# Patient Record
Sex: Male | Born: 1980 | Race: White | Hispanic: No | Marital: Single | Smoking: Current every day smoker
Health system: Southern US, Community
[De-identification: ages and names within clinical notes are randomized; demographics above are authoritative.]

## PROBLEM LIST (undated history)

## (undated) DIAGNOSIS — M542 Cervicalgia: Secondary | ICD-10-CM

## (undated) DIAGNOSIS — B1920 Unspecified viral hepatitis C without hepatic coma: Secondary | ICD-10-CM

## (undated) DIAGNOSIS — N200 Calculus of kidney: Secondary | ICD-10-CM

## (undated) DIAGNOSIS — R51 Headache: Secondary | ICD-10-CM

## (undated) DIAGNOSIS — F419 Anxiety disorder, unspecified: Secondary | ICD-10-CM

## (undated) DIAGNOSIS — N390 Urinary tract infection, site not specified: Secondary | ICD-10-CM

## (undated) DIAGNOSIS — F329 Major depressive disorder, single episode, unspecified: Secondary | ICD-10-CM

## (undated) DIAGNOSIS — F32A Depression, unspecified: Secondary | ICD-10-CM

## (undated) DIAGNOSIS — G8929 Other chronic pain: Secondary | ICD-10-CM

## (undated) DIAGNOSIS — J45909 Unspecified asthma, uncomplicated: Secondary | ICD-10-CM

## (undated) HISTORY — PX: NO PAST SURGERIES: SHX2092

## (undated) HISTORY — DX: Unspecified viral hepatitis C without hepatic coma: B19.20

---

## 2002-08-02 ENCOUNTER — Emergency Department (HOSPITAL_COMMUNITY): Admission: EM | Admit: 2002-08-02 | Discharge: 2002-08-02 | Payer: Self-pay | Admitting: Emergency Medicine

## 2002-08-02 ENCOUNTER — Encounter: Payer: Self-pay | Admitting: Emergency Medicine

## 2009-04-05 ENCOUNTER — Emergency Department (HOSPITAL_COMMUNITY): Admission: EM | Admit: 2009-04-05 | Discharge: 2009-04-05 | Payer: Self-pay | Admitting: Emergency Medicine

## 2009-04-08 ENCOUNTER — Emergency Department (HOSPITAL_COMMUNITY): Admission: EM | Admit: 2009-04-08 | Discharge: 2009-04-08 | Payer: Self-pay | Admitting: Emergency Medicine

## 2009-04-12 ENCOUNTER — Emergency Department (HOSPITAL_COMMUNITY): Admission: EM | Admit: 2009-04-12 | Discharge: 2009-04-12 | Payer: Self-pay | Admitting: Emergency Medicine

## 2009-04-12 ENCOUNTER — Emergency Department (HOSPITAL_COMMUNITY): Admission: EM | Admit: 2009-04-12 | Discharge: 2009-04-12 | Payer: Self-pay | Admitting: Family Medicine

## 2010-04-17 ENCOUNTER — Emergency Department (HOSPITAL_COMMUNITY): Admission: EM | Admit: 2010-04-17 | Discharge: 2010-04-17 | Payer: Self-pay | Admitting: Emergency Medicine

## 2010-09-04 ENCOUNTER — Emergency Department (HOSPITAL_COMMUNITY)
Admission: EM | Admit: 2010-09-04 | Discharge: 2010-09-05 | Disposition: A | Discharge status: Rehabilitation Facility | Payer: Self-pay | Attending: Emergency Medicine | Admitting: Emergency Medicine

## 2010-09-04 DIAGNOSIS — F1910 Other psychoactive substance abuse, uncomplicated: Secondary | ICD-10-CM | POA: Insufficient documentation

## 2010-09-04 DIAGNOSIS — R000 Tachycardia, unspecified: Secondary | ICD-10-CM | POA: Insufficient documentation

## 2010-09-04 LAB — DIFFERENTIAL
Basophils Absolute: 0 10*3/uL (ref 0.0–0.1)
Basophils Relative: 0 % (ref 0–1)
Eosinophils Absolute: 0.1 10*3/uL (ref 0.0–0.7)
Eosinophils Relative: 2 % (ref 0–5)
Lymphocytes Relative: 37 % (ref 12–46)
Lymphs Abs: 2.8 10*3/uL (ref 0.7–4.0)
Monocytes Absolute: 0.6 10*3/uL (ref 0.1–1.0)
Monocytes Relative: 7 % (ref 3–12)
Neutro Abs: 4 10*3/uL (ref 1.7–7.7)
Neutrophils Relative %: 54 % (ref 43–77)

## 2010-09-04 LAB — COMPREHENSIVE METABOLIC PANEL
ALT: 15 U/L (ref 0–53)
AST: 16 U/L (ref 0–37)
Albumin: 4.5 g/dL (ref 3.5–5.2)
Alkaline Phosphatase: 78 U/L (ref 39–117)
CO2: 23 mEq/L (ref 19–32)
Calcium: 9.6 mg/dL (ref 8.4–10.5)
Chloride: 101 mEq/L (ref 96–112)
Creatinine, Ser: 1.02 mg/dL (ref 0.4–1.5)
GFR calc Af Amer: >60 mL/min (ref >60)
GFR calc non Af Amer: >60 mL/min (ref >60)
Glucose, Bld: 157 mg/dL — ABNORMAL HIGH (ref 70–99)
Potassium: 3.6 mEq/L (ref 3.5–5.1)
Total Bilirubin: 0.6 mg/dL (ref 0.3–1.2)
Total Protein: 8.1 g/dL (ref 6.0–8.3)

## 2010-09-04 LAB — CBC
HCT: 51.2 % (ref 39.0–52.0)
Hemoglobin: 17.9 g/dL — ABNORMAL HIGH (ref 13.0–17.0)
MCH: 31.2 pg (ref 26.0–34.0)
MCHC: 35 g/dL (ref 30.0–36.0)
MCV: 89.2 fL (ref 78.0–100.0)
Platelets: 215 10*3/uL (ref 150–400)
RBC: 5.74 MIL/uL (ref 4.22–5.81)
WBC: 7.5 10*3/uL (ref 4.0–10.5)

## 2010-09-04 LAB — ETHANOL: Alcohol, Ethyl (B): 10 mg/dL (ref 0–10)

## 2010-09-04 LAB — RAPID URINE DRUG SCREEN, HOSP PERFORMED: Barbiturates: NOT DETECTED

## 2012-06-13 ENCOUNTER — Emergency Department (HOSPITAL_COMMUNITY)
Admission: EM | Admit: 2012-06-13 | Discharge: 2012-06-13 | Disposition: A | Discharge status: Home / Self Care | Payer: No Typology Code available for payment source | Attending: Emergency Medicine | Admitting: Emergency Medicine

## 2012-06-13 ENCOUNTER — Emergency Department (HOSPITAL_COMMUNITY): Payer: No Typology Code available for payment source

## 2012-06-13 ENCOUNTER — Encounter (HOSPITAL_COMMUNITY): Payer: Self-pay | Admitting: Nurse Practitioner

## 2012-06-13 DIAGNOSIS — F17200 Nicotine dependence, unspecified, uncomplicated: Secondary | ICD-10-CM | POA: Insufficient documentation

## 2012-06-13 DIAGNOSIS — Z79899 Other long term (current) drug therapy: Secondary | ICD-10-CM | POA: Insufficient documentation

## 2012-06-13 DIAGNOSIS — Z87828 Personal history of other (healed) physical injury and trauma: Secondary | ICD-10-CM | POA: Insufficient documentation

## 2012-06-13 DIAGNOSIS — M542 Cervicalgia: Secondary | ICD-10-CM | POA: Insufficient documentation

## 2012-06-13 DIAGNOSIS — G8929 Other chronic pain: Secondary | ICD-10-CM | POA: Insufficient documentation

## 2012-06-13 MED ORDER — OXYCODONE-ACETAMINOPHEN 5-325 MG PO TABS
2.0000 | ORAL_TABLET | Freq: Once | ORAL | Status: AC
  Administered 2012-06-13: 2 via ORAL
  Filled 2012-06-13: qty 2

## 2012-06-13 MED ORDER — HYDROCODONE-ACETAMINOPHEN 5-325 MG PO TABS: 2.0000 | ORAL_TABLET | ORAL | Status: DC | PRN

## 2012-06-13 MED ORDER — CYCLOBENZAPRINE HCL 10 MG PO TABS
10.0000 mg | ORAL_TABLET | Freq: Once | ORAL | Status: AC
  Administered 2012-06-13: 10 mg via ORAL
  Filled 2012-06-13: qty 1

## 2012-06-13 MED ORDER — IBUPROFEN 400 MG PO TABS
800.0000 mg | ORAL_TABLET | Freq: Once | ORAL | Status: AC
  Administered 2012-06-13: 800 mg via ORAL
  Filled 2012-06-13: qty 2

## 2012-06-13 MED ORDER — IBUPROFEN 600 MG PO TABS: 600.0000 mg | ORAL_TABLET | Freq: Four times a day (QID) | ORAL | Status: DC | PRN

## 2012-06-13 MED ORDER — CYCLOBENZAPRINE HCL 10 MG PO TABS: 10.0000 mg | ORAL_TABLET | Freq: Two times a day (BID) | ORAL | Status: DC | PRN

## 2012-06-13 NOTE — ED Notes (Signed)
Patient is alert and orientedx4.  Patient was explained discharge instructions and he understood them.  Patient's step-mom is coming to get the patient to transport him home.

## 2012-06-13 NOTE — ED Notes (Signed)
Patient transported to CT 
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
## 2012-06-13 NOTE — ED Notes (Signed)
Patient has called out several times and asked for pain medication.  I advised him that we only had one provider seeing all the patients in [place] and I would let her know about his pain.  A CT has been ordered for him.
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
## 2012-06-17 ENCOUNTER — Other Ambulatory Visit: Payer: Self-pay | Admitting: Specialist

## 2012-06-17 DIAGNOSIS — R29898 Other symptoms and signs involving the musculoskeletal system: Secondary | ICD-10-CM

## 2012-06-19 NOTE — ED Provider Notes (Signed)
Medical screening examination/treatment/procedure(s) were performed by non-physician practitioner and as supervising physician I was immediately available for consultation/collaboration.   Hurman Horn, MD 06/19/12 2200

## 2012-06-22 ENCOUNTER — Ambulatory Visit
Admission: RE | Admit: 2012-06-22 | Discharge: 2012-06-22 | Disposition: A | Discharge status: Home / Self Care | Payer: PRIVATE HEALTH INSURANCE | Source: Ambulatory Visit | Attending: Specialist | Admitting: Specialist

## 2012-06-22 DIAGNOSIS — R29898 Other symptoms and signs involving the musculoskeletal system: Secondary | ICD-10-CM

## 2012-06-23 ENCOUNTER — Encounter (HOSPITAL_COMMUNITY): Payer: Self-pay

## 2012-06-23 ENCOUNTER — Inpatient Hospital Stay (HOSPITAL_COMMUNITY)
Admission: EM | Admit: 2012-06-23 | Discharge: 2012-07-08 | DRG: 096 | Disposition: A | Discharge status: Skilled Nursing Facility | Payer: PRIVATE HEALTH INSURANCE | Attending: Internal Medicine | Admitting: Internal Medicine

## 2012-06-23 DIAGNOSIS — M4640 Discitis, unspecified, site unspecified: Secondary | ICD-10-CM

## 2012-06-23 DIAGNOSIS — E876 Hypokalemia: Secondary | ICD-10-CM | POA: Diagnosis present

## 2012-06-23 DIAGNOSIS — B1920 Unspecified viral hepatitis C without hepatic coma: Secondary | ICD-10-CM | POA: Diagnosis present

## 2012-06-23 DIAGNOSIS — R52 Pain, unspecified: Secondary | ICD-10-CM | POA: Diagnosis present

## 2012-06-23 DIAGNOSIS — G061 Intraspinal abscess and granuloma: Principal | ICD-10-CM | POA: Diagnosis present

## 2012-06-23 DIAGNOSIS — Z720 Tobacco use: Secondary | ICD-10-CM | POA: Diagnosis present

## 2012-06-23 DIAGNOSIS — G8929 Other chronic pain: Secondary | ICD-10-CM

## 2012-06-23 DIAGNOSIS — F17200 Nicotine dependence, unspecified, uncomplicated: Secondary | ICD-10-CM | POA: Diagnosis present

## 2012-06-23 DIAGNOSIS — F1910 Other psychoactive substance abuse, uncomplicated: Secondary | ICD-10-CM | POA: Diagnosis present

## 2012-06-23 DIAGNOSIS — M519 Unspecified thoracic, thoracolumbar and lumbosacral intervertebral disc disorder: Secondary | ICD-10-CM

## 2012-06-23 DIAGNOSIS — G062 Extradural and subdural abscess, unspecified: Secondary | ICD-10-CM

## 2012-06-23 DIAGNOSIS — M4642 Discitis, unspecified, cervical region: Secondary | ICD-10-CM | POA: Diagnosis present

## 2012-06-23 DIAGNOSIS — M5090 Cervical disc disorder, unspecified, unspecified cervical region: Secondary | ICD-10-CM | POA: Diagnosis present

## 2012-06-23 LAB — CBC WITH DIFFERENTIAL/PLATELET
Eosinophils Absolute: 0 10*3/uL (ref 0.0–0.7)
Eosinophils Relative: 0 % (ref 0–5)
Hemoglobin: 16.7 g/dL (ref 13.0–17.0)
Lymphs Abs: 2.1 10*3/uL (ref 0.7–4.0)
MCH: 31.5 pg (ref 26.0–34.0)
MCV: 87 fL (ref 78.0–100.0)
Monocytes Relative: 7 % (ref 3–12)
RBC: 5.3 MIL/uL (ref 4.22–5.81)

## 2012-06-23 LAB — URINALYSIS, ROUTINE W REFLEX MICROSCOPIC
Bilirubin Urine: NEGATIVE
Glucose, UA: NEGATIVE mg/dL
Hgb urine dipstick: NEGATIVE
Specific Gravity, Urine: 1.025 (ref 1.005–1.030)
Urobilinogen, UA: 1 mg/dL (ref 0.0–1.0)

## 2012-06-23 LAB — RAPID URINE DRUG SCREEN, HOSP PERFORMED
Amphetamines: NOT DETECTED
Barbiturates: NOT DETECTED
Benzodiazepines: NOT DETECTED
Cocaine: NOT DETECTED
Tetrahydrocannabinol: POSITIVE — AB

## 2012-06-23 LAB — BASIC METABOLIC PANEL
BUN: 13 mg/dL (ref 6–23)
CO2: 26 mEq/L (ref 19–32)
Calcium: 9.5 mg/dL (ref 8.4–10.5)
GFR calc non Af Amer: >90 mL/min (ref >90)
Glucose, Bld: 98 mg/dL (ref 70–99)
Potassium: 4 mEq/L (ref 3.5–5.1)

## 2012-06-23 LAB — URINE MICROSCOPIC-ADD ON

## 2012-06-23 MED ORDER — ACETAMINOPHEN 650 MG RE SUPP: 650.0000 mg | Freq: Four times a day (QID) | RECTAL | Status: DC | PRN

## 2012-06-23 MED ORDER — VANCOMYCIN HCL IN DEXTROSE 1-5 GM/200ML-% IV SOLN
1000.0000 mg | Freq: Once | INTRAVENOUS | Status: AC
  Administered 2012-06-23: 1000 mg via INTRAVENOUS
  Filled 2012-06-23: qty 200

## 2012-06-23 MED ORDER — ACETAMINOPHEN 325 MG PO TABS
650.0000 mg | ORAL_TABLET | Freq: Four times a day (QID) | ORAL | Status: DC | PRN
  Administered 2012-07-02: 650 mg via ORAL
  Filled 2012-06-23: qty 2

## 2012-06-23 MED ORDER — SODIUM CHLORIDE 0.9 % IV BOLUS (SEPSIS)
1000.0000 mL | Freq: Once | INTRAVENOUS | Status: AC
  Administered 2012-06-23: 1000 mL via INTRAVENOUS

## 2012-06-23 MED ORDER — MORPHINE SULFATE 4 MG/ML IJ SOLN
4.0000 mg | Freq: Once | INTRAMUSCULAR | Status: AC
  Administered 2012-06-23: 4 mg via INTRAVENOUS
  Filled 2012-06-23: qty 1

## 2012-06-23 MED ORDER — HYDROMORPHONE HCL PF 1 MG/ML IJ SOLN
1.0000 mg | Freq: Once | INTRAMUSCULAR | Status: AC
  Administered 2012-06-23: 1 mg via INTRAVENOUS
  Filled 2012-06-23: qty 1

## 2012-06-23 MED ORDER — ONDANSETRON HCL 4 MG PO TABS: 4.0000 mg | ORAL_TABLET | Freq: Four times a day (QID) | ORAL | Status: DC | PRN

## 2012-06-23 MED ORDER — MORPHINE SULFATE 2 MG/ML IJ SOLN
2.0000 mg | INTRAMUSCULAR | Status: DC | PRN
  Administered 2012-06-23 – 2012-06-29 (×33): 4 mg via INTRAVENOUS
  Administered 2012-06-29: 2 mg via INTRAVENOUS
  Administered 2012-06-29 – 2012-06-30 (×4): 4 mg via INTRAVENOUS
  Filled 2012-06-23 (×13): qty 2
  Filled 2012-06-23: qty 1
  Filled 2012-06-23 (×24): qty 2

## 2012-06-23 MED ORDER — METRONIDAZOLE IN NACL 5-0.79 MG/ML-% IV SOLN
500.0000 mg | Freq: Once | INTRAVENOUS | Status: AC
  Administered 2012-06-23: 500 mg via INTRAVENOUS
  Filled 2012-06-23: qty 100

## 2012-06-23 MED ORDER — SODIUM CHLORIDE 0.9 % IJ SOLN
3.0000 mL | Freq: Two times a day (BID) | INTRAMUSCULAR | Status: DC
Start: 2012-06-23 — End: 2012-07-08
  Administered 2012-06-23 – 2012-07-07 (×9): 3 mL via INTRAVENOUS

## 2012-06-23 MED ORDER — VANCOMYCIN HCL IN DEXTROSE 1-5 GM/200ML-% IV SOLN
1000.0000 mg | Freq: Three times a day (TID) | INTRAVENOUS | Status: DC
  Administered 2012-06-23 – 2012-06-24 (×3): 1000 mg via INTRAVENOUS
  Filled 2012-06-23 (×6): qty 200

## 2012-06-23 MED ORDER — HYDROCODONE-ACETAMINOPHEN 5-325 MG PO TABS
2.0000 | ORAL_TABLET | ORAL | Status: DC | PRN
  Administered 2012-06-24 – 2012-06-30 (×26): 2 via ORAL
  Filled 2012-06-23 (×26): qty 2

## 2012-06-23 MED ORDER — ONDANSETRON HCL 4 MG/2ML IJ SOLN: 4.0000 mg | Freq: Four times a day (QID) | INTRAMUSCULAR | Status: DC | PRN

## 2012-06-23 MED ORDER — SODIUM CHLORIDE 0.9 % IV SOLN
INTRAVENOUS | Status: DC
  Administered 2012-06-23: 23:00:00 via INTRAVENOUS
  Administered 2012-06-30: 250 mL via INTRAVENOUS
  Administered 2012-07-05: 20 mL/h via INTRAVENOUS

## 2012-06-23 MED ORDER — HEPARIN SODIUM (PORCINE) 5000 UNIT/ML IJ SOLN
5000.0000 [IU] | Freq: Three times a day (TID) | INTRAMUSCULAR | Status: DC
  Administered 2012-06-23 – 2012-07-08 (×43): 5000 [IU] via SUBCUTANEOUS
  Filled 2012-06-23 (×52): qty 1

## 2012-06-23 MED ORDER — METHOCARBAMOL 100 MG/ML IJ SOLN
500.0000 mg | Freq: Four times a day (QID) | INTRAVENOUS | Status: DC
  Administered 2012-06-24 – 2012-07-03 (×38): 500 mg via INTRAVENOUS
  Filled 2012-06-23 (×50): qty 5

## 2012-06-23 MED ORDER — ALUM & MAG HYDROXIDE-SIMETH 200-200-20 MG/5ML PO SUSP: 30.0000 mL | Freq: Four times a day (QID) | ORAL | Status: DC | PRN

## 2012-06-23 MED ORDER — DEXTROSE 5 % IV SOLN
1.0000 g | Freq: Two times a day (BID) | INTRAVENOUS | Status: DC
  Administered 2012-06-23 – 2012-06-24 (×3): 1 g via INTRAVENOUS
  Filled 2012-06-23 (×4): qty 1

## 2012-06-23 MED ORDER — IBUPROFEN 600 MG PO TABS
600.0000 mg | ORAL_TABLET | Freq: Four times a day (QID) | ORAL | Status: DC | PRN
  Filled 2012-06-23: qty 1

## 2012-06-23 NOTE — Consult Note (Signed)
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   HQI:Christopher Berg is an 32 y.o. male. Right hand dominant with a two week history of neck pain. 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 History of polysubstance use documented by Serum toxicology screen 09/2010 (see EMR record for the visit). Was seen at [facility] nearly 2 weeks ago then at [facility] on 06/13/2012 complaining of severe neck and shoulder pain.  CT Scan 1/11 with degenerative changes C5-6 and C6-7 no epidural or paraspinal hematoma. Referred to my partner Dr. Lajoyce Corners on call for Orthopaedics for follow up. Patient seen in my office 06/15/2012 with neck pain wearing a soft C-collar With diffuse left upper extremity weakness, non focal. MRI was ordered. The results of the MRI done 06/22/2012 were called to me today showing discitis at C5-6 with epidural  abscess. Patient was told to go to the emergency room to Be admitted for treatment. Low grade fever. Night sweats.   History reviewed. No pertinent past medical history.  History reviewed. No pertinent past surgical history.  No family history on file.  Social History:  reports that he has been smoking.  He does not have any smokeless tobacco history on file. He reports that he drinks alcohol. He reports that he does not use illicit drugs.  Allergies: No Known Allergies  Medications: Prior to Admission:  Percocet 5/325 1-2 every 4-6 hours prn pain.  Results for orders placed during the hospital encounter of 06/23/12 (from the past 48 hour(s))  CBC WITH DIFFERENTIAL     Status: Abnormal   Collection Time   06/23/12  1:11 PM      Component Value Range Comment   WBC 10.1  4.0 - 10.5 K/uL    RBC 5.30  4.22 - 5.81 MIL/uL    Hemoglobin 16.7  13.0 - 17.0 g/dL    HCT 52.8  41.3 - 24.4 %    MCV 87.0   78.0 - 100.0 fL    MCH 31.5  26.0 - 34.0 pg    MCHC 36.2 (*) 30.0 - 36.0 g/dL    RDW 01.0  27.2 - 53.6 %    Platelets 253  150 - 400 K/uL    Neutrophils Relative 72  43 - 77 %    Neutro Abs 7.2  1.7 - 7.7 K/uL    Lymphocytes Relative 20  12 - 46 %    Lymphs Abs 2.1  0.7 - 4.0 K/uL    Monocytes Relative 7  3 - 12 %    Monocytes Absolute 0.8  0.1 - 1.0 K/uL    Eosinophils Relative 0  0 - 5 %    Eosinophils Absolute 0.0  0.0 - 0.7 K/uL    Basophils Relative 0  0 - 1 %    Basophils Absolute 0.0  0.0 - 0.1 K/uL   BASIC METABOLIC PANEL     Status: Normal   Collection Time   06/23/12  1:11 PM      Component Value Range Comment   Sodium 138  135 - 145 mEq/L    Potassium 4.0  3.5 - 5.1 mEq/L    Chloride 100  96 - 112 mEq/L    CO2 26  19 - 32 mEq/L    Glucose, Bld 98  70 - 99 mg/dL    BUN 13  6 - 23 mg/dL  Creatinine, Ser 0.75  0.50 - 1.35 mg/dL    Calcium 9.5  8.4 - 16.1 mg/dL    GFR calc non Af Amer >90  >90 mL/min    GFR calc Af Amer >90  >90 mL/min   URINALYSIS, ROUTINE W REFLEX MICROSCOPIC     Status: Abnormal   Collection Time   06/23/12  1:46 PM      Component Value Range Comment   Color, Urine YELLOW  YELLOW    APPearance TURBID (*) CLEAR    Specific Gravity, Urine 1.025  1.005 - 1.030    pH 8.0  5.0 - 8.0    Glucose, UA NEGATIVE  NEGATIVE mg/dL    Hgb urine dipstick NEGATIVE  NEGATIVE    Bilirubin Urine NEGATIVE  NEGATIVE    Ketones, ur NEGATIVE  NEGATIVE mg/dL    Protein, ur NEGATIVE  NEGATIVE mg/dL    Urobilinogen, UA 1.0  0.0 - 1.0 mg/dL    Nitrite NEGATIVE  NEGATIVE    Leukocytes, UA TRACE (*) NEGATIVE   URINE RAPID DRUG SCREEN (HOSP PERFORMED)     Status: Abnormal   Collection Time   06/23/12  1:46 PM      Component Value Range Comment   Opiates NONE DETECTED  NONE DETECTED    Cocaine NONE DETECTED  NONE DETECTED    Benzodiazepines NONE DETECTED  NONE DETECTED    Amphetamines NONE DETECTED  NONE DETECTED    Tetrahydrocannabinol POSITIVE (*) NONE DETECTED     Barbiturates NONE DETECTED  NONE DETECTED   URINE MICROSCOPIC-ADD ON     Status: Abnormal   Collection Time   06/23/12  1:46 PM      Component Value Range Comment   Squamous Epithelial / LPF RARE  RARE    WBC, UA 0-2  <3 WBC/hpf    Bacteria, UA FEW (*) RARE    Urine-Other AMORPHOUS URATES/PHOSPHATES       Mr Cervical Spine Wo Contrast  06/23/2012  *RADIOLOGY REPORT*  Clinical Data: Severe neck pain and stiffness.  Bilateral shoulder pain and upper extremity weakness since a lifting injury 2 weeks ago.  MRI CERVICAL SPINE WITHOUT CONTRAST  Technique:  Multiplanar and multiecho pulse sequences of the cervical spine, to include the craniocervical junction and cervicothoracic junction, were obtained according to standard protocol without intravenous contrast.  Comparison: CT scan dated 06/13/2012 and cervical MRI dated 04/12/2009  Findings: The patient has diskitis at C5-6 with an anterior epidural abscess and a prevertebral soft tissue abscess.  The posterior longitudinal ligament is elevated from the C4-5 level to the C6-7 level.  Prevertebral edema extends from C2-3 to C6-7.  There is narrowing of the C5-6 disc space with edema in the C5 and C6 vertebra.  There is a small erosion of the posterior-superior aspect of C6 and there is a 2.5 mm retrolisthesis of C5 on C6. The thecal sac is compressed by the abscess and there is slight compression of the spinal cord without myelopathy.  C1-2 through C4-5:  The discs are normal.  C6-7:  Small disc protrusion with accompanying osteophytes centrally and asymmetric into the left lateral recess, decreased in size since the prior MRI. 2 mm retrolisthesis of C6 on C7.  C7-T1 through T2-3:  Normal.  IMPRESSION: 1.  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 2.  Thecal sac and spinal cord are slightly compressed at C5-6 without myelopathy. 3.  2.5 mm retrolisthesis of C5 on C6. 3.  Edema in the C5  and C6 vertebra.  This could  represent reactive edema or osteomyelitis.  There is a small focal area of erosion of the posterior-superior aspect  of C6 in the midline but the margin is sclerotic suggesting this is chronic.           Emergent results were called by phone at the time of interpretation on 06/23/2012 at 9:25 a.m. to Dr. Otelia Sergeant,, who verbally acknowledged the results.   Original Report Authenticated By: Francene Boyers, M.D.     Blood pressure 135/93, pulse 95, temperature 98.4 F (36.9 C), temperature source Oral, resp. rate 16, SpO2 100.00%.  Orthopaedic Exam:Awake, alert, and Oriented x 4 pleasant thin male. Cervical collar is in place.  Guarded ROM of the cervical spine all directions. Weakness left arm shoulder abduction 5-/5, left biceps 4+/5, left triceps 4+/5. Left wrist extension is 5-/5, left wrist volar flexion is 5-/5 Finger flexion and extension is intact and normal. Hoffman negative bilateral.  Right arm strength is normal. Lower extremity motor and sensation is normal. No clonus, no spasticity.  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 History of polysubstance abuse, toxicology positive for tetrahydrocannabinol today.   Plan: Recommend admit for IV antibiotics. Soft cervical collar for  Immobilization of the C-spine. Infectious disease consult. Discussed with ER physician my request for this patient to be admitted to medical service with with ID consultation and request for Neurosurgery consultation. I spoke with Dr. Jeral Fruit by phone and he has indicated that I should handle this case as I am a spine surgeon and hung up the phone. Dr. Jeral Fruit is the Neurosurgeon on call today for unassigned patients. I have seen this patient today and advised for admission for antibiotic treatment and close neurologic monitoring. He has had this infection likely for 2 weeks and hopefully it will be sensitive to antibiotics. If there is  worsening neurologic Status then he will need decompression.  I discussed the patient's case with Dr. Annell Greening. Will follow while in the Hospital.  Christopher Berg 06/23/2012, 2:47 PM

## 2012-06-23 NOTE — ED Notes (Addendum)
Pt. Has had posterior neck pain for 2 weeks.  Dr. Otelia Sergeant from [facility] did an MRI last night and pt. Was called by their office this am and was instructed to come to the ED to be seen by a Neuro Surgeon, ASAP>   Pt. Is out of his pain medication and is having pain, pt. Having coldness and numbness to hands.  Christopher Berg

## 2012-06-23 NOTE — H&P (Signed)
History and Physical       Hospital Admission Note Date: 06/23/2012  Patient name: Christopher Berg Medical record number: [id number] Date of birth: 1981-01-27 Age: 32 y.o. Gender: male PCP: Patient has no PCP   Chief Complaint:  Intractable neck pain with left upper extremity weakness  HPI: Patient is a 32 year old male with IV drug abuse history, polysubstance abuse has been complaining of intractable back pain for the last 2 weeks. History was obtained from the patient who stated that he was only is seen at the [facility] 2 weeks ago, then again [facility] ED on 06/13/2012 and he was referred to orthopedics for neck pain. CT scan on 06/13/2012 had just shown degenerative changes of the C5-C6 and C6-C7. Patient was subsequently seen by Dr. Otelia Sergeant in his office on 06/15/2012 and outpatient MRI was ordered. MRI done on 06/22/2012 showed discitis in C5-C6 with epidural abscess and prevertebral abscess with the edema in the C5-C6 vertebrae. The results were called to Dr. Otelia Sergeant, and patient was recommended to come to ER.  Patient is currently complaining of 10/10 pain in the neck and shoulders with weakness and numbness and tingling in the left arm.  Review of Systems:  Constitutional: Denies fever, chills, diaphoresis, appetite change and fatigue.  HEENT: Denies photophobia, eye pain, redness, hearing loss, ear pain, congestion, sore throat, rhinorrhea, sneezing, mouth sores, trouble swallowing,  and tinnitus.  + neck pain with stiffness Respiratory: Denies SOB, DOE, cough, chest tightness,  and wheezing.   Cardiovascular: Denies chest pain, palpitations and leg swelling.  Gastrointestinal: Denies nausea, vomiting, abdominal pain, diarrhea, constipation, blood in stool and abdominal distention.  Genitourinary: Denies dysuria, urgency, frequency, hematuria, flank pain and difficulty urinating.  Musculoskeletal: Denies myalgias, joint swelling,  arthralgias and gait problem. + he states that the neck pain is being referred to the back  Skin: Denies pallor, rash and wound.  Neurological: Denies dizziness, seizures, syncope, light-headedness, and headaches.  Hematological: Denies adenopathy. Easy bruising, personal or family bleeding history  Psychiatric/Behavioral: Denies suicidal ideation, mood changes, confusion, nervousness, sleep disturbance and agitation  Past Medical History: Nicotine abuse Polysubstance abuse  History reviewed. No pertinent past surgical history.  Medications: Prior to Admission medications   Medication Sig Start Date End Date Taking? Authorizing Provider  cyclobenzaprine (FLEXERIL) 10 MG tablet Take 10 mg by mouth every 8 (eight) hours as needed. For muscle spasms   Yes Historical Provider, MD  HYDROcodone-acetaminophen (NORCO) 10-325 MG per tablet Take 1 tablet by mouth every 2 (two) hours as needed. For pain   Yes Historical Provider, MD  ibuprofen (ADVIL,MOTRIN) 600 MG tablet Take 600 mg by mouth every 6 (six) hours as needed. For pain. 06/13/12  Yes Remi Haggard, NP    Allergies:  No Known Allergies  Social History:  reports that he has been smoking.  He does not have any smokeless tobacco history on file. He reports that he drinks alcohol. He reports that he does not use illicit drugs. Patient smokes one pack per day for last 15 years. He states he is addicted to marijuana and opiates. He also uses cocaine and IV heroin and IV Dilaudid. He has used IV heroin 2 and half weeks ago, Cocaine and Dilaudid 6 days ago.    Family History: No family history on file.  Physical Exam: Blood pressure 135/93, pulse 95, temperature 98.4 F (36.9 C), temperature source Oral, resp. rate 16, SpO2 100.00%. General: Alert, awake, oriented x3,  uncomfortable with pain HEENT: normocephalic, atraumatic, anicteric sclera,  pink conjunctiva, pupils equal and reactive to light and accomodation, oropharynx clear Neck:  supple, no masses or lymphadenopathy, no goiter, no bruits  Heart: Regular rate and rhythm, without murmurs, rubs or gallops. Lungs: Clear to auscultation bilaterally, no wheezing, rales or rhonchi. Abdomen: Soft, nontender, nondistended, positive bowel sounds, no masses. Extremities: No clubbing, cyanosis or edema with positive pedal pulses. Neuro: Cervical tenderness with decreased left-sided grip, dec sensation  Psych: alert and oriented x 3, normal mood and affect Skin: no rashes or lesions, warm and dry, multiple tattoos    LABS on Admission:  Basic Metabolic Panel:  Lab 06/23/12 1610  NA 138  K 4.0  CL 100  CO2 26  GLUCOSE 98  BUN 13  CREATININE 0.75  CALCIUM 9.5  MG --  PHOS --   CBC:  Lab 06/23/12 1311  WBC 10.1  NEUTROABS 7.2  HGB 16.7  HCT 46.1  MCV 87.0  PLT 253     Radiological Exams on Admission: Ct Cervical Spine Wo Contrast  06/13/2012  *RADIOLOGY REPORT*  Clinical Data: Chronic neck pain.  CT CERVICAL SPINE WITHOUT CONTRAST  Technique:  Multidetector CT imaging of the cervical spine was performed. Multiplanar CT image reconstructions were also generated.  Comparison: MRI 04/12/2009  Findings: Normal alignment.  Mild degenerative disc disease changes diffusely with disc space narrowing and spurring, most pronounced at C5-6 and C6-7.  The previously described left paracentral disc protrusion by MRI cannot be appreciated on CT and could be further evaluated with elective MRI if felt clinically indicated.  No fracture.  No epidural or paraspinal hematoma.  IMPRESSION: Degenerative changes.  No acute findings.   Original Report Authenticated By: Charlett Nose, M.D.    Mr Cervical Spine Wo Contrast  06/23/2012  *RADIOLOGY REPORT*  Clinical Data: Severe neck pain and stiffness.  Bilateral shoulder pain and upper extremity weakness since a lifting injury 2 weeks ago.  MRI CERVICAL SPINE WITHOUT CONTRAST  Technique:  Multiplanar and multiecho pulse sequences of the  cervical spine, to include the craniocervical junction and cervicothoracic junction, were obtained according to standard protocol without intravenous contrast.  Comparison: CT scan dated 06/13/2012 and cervical MRI dated 04/12/2009  Findings: The patient has diskitis at C5-6 with an anterior epidural abscess and a prevertebral soft tissue abscess.  The posterior longitudinal ligament is elevated from the C4-5 level to the C6-7 level.  Prevertebral edema extends from C2-3 to C6-7.  There is narrowing of the C5-6 disc space with edema in the C5 and C6 vertebra.  There is a small erosion of the posterior-superior aspect of C6 and there is a 2.5 mm retrolisthesis of C5 on C6. The thecal sac is compressed by the abscess and there is slight compression of the spinal cord without myelopathy.  C1-2 through C4-5:  The discs are normal.  C6-7:  Small disc protrusion with accompanying osteophytes centrally and asymmetric into the left lateral recess, decreased in size since the prior MRI. 2 mm retrolisthesis of C6 on C7.  C7-T1 through T2-3:  Normal.  IMPRESSION: 1.  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 2.  Thecal sac and spinal cord are slightly compressed at C5-6 without myelopathy. 3.  2.5 mm retrolisthesis of C5 on C6. 3.  Edema in the C5 and C6 vertebra.  This could represent reactive edema or osteomyelitis.  There is a small focal area of erosion of the posterior-superior aspect  of C6 in the midline but the margin is sclerotic suggesting  this is chronic.           Emergent results were called by phone at the time of interpretation on 06/23/2012 at 9:25 a.m. to Dr. Otelia Sergeant,, who verbally acknowledged the results.   Original Report Authenticated By: Francene Boyers, M.D.     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 Barbaraann Faster  recommendations. - Blood cultures has been obtained, patient has been started on vancomycin, Flagyl, cefepime in the ED.  - I obtained ID consult, discussed with Dr. Cliffton Asters recommended to continue only vancomycin and cefepime, will evaluate - Placed on serial neuro checks, if any worsening will need aspiration/decompression, will defer to ortho-spine, Dr. Otelia Sergeant, also needs aspiration for cultures. - Immobilization of the C-spine with soft cervical collar, which was lying on the floor and I explained the patient the need to keep the C-spine immobilized especially with the abscess.   Active Problems:   Polysubstance abuse -Social work consult, patient was counseled against substance abuse.   Nicotine abuse - Patient was strongly counseled against polysubstance abuse and nicotine abuse. He declined a nicotine patches.  DVT prophylaxis:  heparin subcutaneous   CODE STATUS:  Full code   Further plan will depend as patient's clinical course evolves and further radiologic and laboratory data become available.   Time Spent on Admission: 1 hour  RAI,RIPUDEEP M.D. [name] 06/23/2012, 6:06 PM Pager: [phone]  If 7PM-7AM, please contact night-coverage [email or web address] Password [email or web address]

## 2012-06-23 NOTE — ED Notes (Addendum)
Mother, Herbert Spires, will return, if needed please call [phone]

## 2012-06-23 NOTE — ED Provider Notes (Signed)
History     CSN: [id number]  Arrival date & time 06/23/12  1141   First MD Initiated Contact with Patient 06/23/12 1209      Chief Complaint  Patient presents with  . Neck Pain    (Consider location/radiation/quality/duration/timing/severity/associated sxs/prior treatment) HPI Comments: Pt comes in with cc of of neck pain. Pt was seen by Dr. Otelia Sergeant, who ordered MRI, and it was discovered that he has epidural abscess, and diskitis. Pt is c/o bilateral hand numbness, worse on the left side, and some dysphagia, but no drooling. He also has neck pain and stiffness. No DIB. The pain is located at the base of the neck and between the shoulder blades. Pt admits to ivda and polysubstance abuse.  Patient is a 32 y.o. male presenting with neck pain. The history is provided by the patient.  Neck Pain  Associated symptoms include numbness. Pertinent negatives include no chest pain and no headaches.    History reviewed. No pertinent past medical history.  History reviewed. No pertinent past surgical history.  No family history on file.  History  Substance Use Topics  . Smoking status: Current Every Day Smoker  . Smokeless tobacco: Not on file  . Alcohol Use: Yes      Review of Systems  Constitutional: Negative for fever, chills, activity change and appetite change.  HENT: Positive for neck pain and neck stiffness.   Eyes: Negative for visual disturbance.  Respiratory: Negative for cough, chest tightness and shortness of breath.   Cardiovascular: Negative for chest pain.  Gastrointestinal: Negative for abdominal pain and abdominal distention.  Genitourinary: Negative for dysuria, enuresis and difficulty urinating.  Musculoskeletal: Negative for back pain and arthralgias.  Neurological: Positive for numbness. Negative for dizziness, light-headedness and headaches.  Psychiatric/Behavioral: Negative for confusion.    Allergies  Review of patient's allergies indicates no known  allergies.  Home Medications   Current Outpatient Rx  Name  Route  Sig  Dispense  Refill  . CYCLOBENZAPRINE HCL 10 MG PO TABS   Oral   Take 10 mg by mouth every 8 (eight) hours as needed. For muscle spasms         . HYDROCODONE-ACETAMINOPHEN 10-325 MG PO TABS   Oral   Take 1 tablet by mouth every 2 (two) hours as needed. For pain         . IBUPROFEN 600 MG PO TABS   Oral   Take 600 mg by mouth every 6 (six) hours as needed. For pain.           BP 135/93  Pulse 95  Temp 98.4 F (36.9 C) (Oral)  Resp 16  SpO2 100%  Physical Exam  Nursing note and vitals reviewed. Constitutional: He is oriented to person, place, and time. He appears well-developed.  HENT:  Head: Normocephalic and atraumatic.  Eyes: Conjunctivae normal and EOM are normal. Pupils are equal, round, and reactive to light.  Neck: Normal range of motion. Neck supple. No JVD present.  Cardiovascular: Normal rate and regular rhythm.   Pulmonary/Chest: Effort normal and breath sounds normal.  Abdominal: Soft. Bowel sounds are normal. He exhibits no distension. There is no tenderness. There is no rebound and no guarding.  Neurological: He is alert and oriented to person, place, and time. No cranial nerve deficit.       Pt has left sided grip weakness, and subjective numbness. 2+ bicepital reflex, 2 + patellar reflex.  Skin: Skin is warm.    ED Course  Procedures (  including critical care time)  Labs Reviewed  CBC WITH DIFFERENTIAL - Abnormal; Notable for the following:    MCHC 36.2 (*)     All other components within normal limits  URINALYSIS, ROUTINE W REFLEX MICROSCOPIC - Abnormal; Notable for the following:    APPearance TURBID (*)     Leukocytes, UA TRACE (*)     All other components within normal limits  URINE RAPID DRUG SCREEN (HOSP PERFORMED) - Abnormal; Notable for the following:    Tetrahydrocannabinol POSITIVE (*)     All other components within normal limits  URINE MICROSCOPIC-ADD ON -  Abnormal; Notable for the following:    Bacteria, UA FEW (*)     All other components within normal limits  BASIC METABOLIC PANEL  CULTURE, BLOOD (ROUTINE X 2)  CULTURE, BLOOD (ROUTINE X 2)   Mr Cervical Spine Wo Contrast  06/23/2012  *RADIOLOGY REPORT*  Clinical Data: Severe neck pain and stiffness.  Bilateral shoulder pain and upper extremity weakness since a lifting injury 2 weeks ago.  MRI CERVICAL SPINE WITHOUT CONTRAST  Technique:  Multiplanar and multiecho pulse sequences of the cervical spine, to include the craniocervical junction and cervicothoracic junction, were obtained according to standard protocol without intravenous contrast.  Comparison: CT scan dated 06/13/2012 and cervical MRI dated 04/12/2009  Findings: The patient has diskitis at C5-6 with an anterior epidural abscess and a prevertebral soft tissue abscess.  The posterior longitudinal ligament is elevated from the C4-5 level to the C6-7 level.  Prevertebral edema extends from C2-3 to C6-7.  There is narrowing of the C5-6 disc space with edema in the C5 and C6 vertebra.  There is a small erosion of the posterior-superior aspect of C6 and there is a 2.5 mm retrolisthesis of C5 on C6. The thecal sac is compressed by the abscess and there is slight compression of the spinal cord without myelopathy.  C1-2 through C4-5:  The discs are normal.  C6-7:  Small disc protrusion with accompanying osteophytes centrally and asymmetric into the left lateral recess, decreased in size since the prior MRI. 2 mm retrolisthesis of C6 on C7.  C7-T1 through T2-3:  Normal.  IMPRESSION: 1.  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 2.  Thecal sac and spinal cord are slightly compressed at C5-6 without myelopathy. 3.  2.5 mm retrolisthesis of C5 on C6. 3.  Edema in the C5 and C6 vertebra.  This could represent reactive edema or osteomyelitis.  There is a small focal area of erosion of the posterior-superior aspect  of C6 in  the midline but the margin is sclerotic suggesting this is chronic.           Emergent results were called by phone at the time of interpretation on 06/23/2012 at 9:25 a.m. to Dr. Otelia Sergeant,, who verbally acknowledged the results.   Original Report Authenticated By: Francene Boyers, M.D.      No diagnosis found.    MDM  Pt has diskitis and epidural abscess. He has some neuro deficits. I called Neurosurgery immediately, was asked to call Dr. Otelia Sergeant. Dr. Otelia Sergeant deferred case to Neurosurgery  - who in turn stated that they will be consultant if needed.  I eventually spoke with Dr. Otelia Sergeant who wants patient to be [facility] with them as consultants.  IVAB started.  CRITICAL CARE Performed by: Derwood Kaplan   Total critical care time: 45 minutes  Critical care time was exclusive of separately billable procedures and treating other patients.  Critical care was necessary to treat or prevent imminent or life-threatening deterioration.  Critical care was time spent personally by me on the following activities: development of treatment plan with patient and/or surrogate as well as nursing, discussions with consultants, evaluation of patient's response to treatment, examination of patient, obtaining history from patient or surrogate, ordering and performing treatments and interventions, ordering and review of laboratory studies, ordering and review of radiographic studies, pulse oximetry and re-evaluation of patient's condition.   Derwood Kaplan, MD 06/23/12 1730

## 2012-06-23 NOTE — Progress Notes (Signed)
ANTIBIOTIC CONSULT NOTE - INITIAL  Pharmacy Consult for Vancomycin/Cefepime Indication: C5-6 diskitis with epidural abscess  No Known Allergies  Patient Measurements: Height: 5\' 11"  (180.3 cm) Weight: 160 lb (72.576 kg) IBW/kg (Calculated) : 75.3   Vital Signs: Temp: 98.4 F (36.9 C) (01/21 1356) Temp src: Oral (01/21 1356) BP: 146/102 mmHg (01/21 1818) Pulse Rate: 92  (01/21 1818) Intake/Output from previous day:   Intake/Output from this shift:    Labs:  Basename 06/23/12 1311  WBC 10.1  HGB 16.7  PLT 253  LABCREA --  CREATININE 0.75   Estimated Creatinine Clearance: 138.6 ml/min (by C-G formula based on Cr of 0.75). No results found for this basename: VANCOTROUGH:2,VANCOPEAK:2,VANCORANDOM:2,GENTTROUGH:2,GENTPEAK:2,GENTRANDOM:2,TOBRATROUGH:2,TOBRAPEAK:2,TOBRARND:2,AMIKACINPEAK:2,AMIKACINTROU:2,AMIKACIN:2, in the last 72 hours   Microbiology: No results found for this or any previous visit (from the past 720 hour(s)).  Medical History: History reviewed. No pertinent past medical history.  Medications:   (Not in a hospital admission) Assessment: 32 y/o male patient admitted with neck pain, found to have C5-6 diskitis with abscess requiring broad spectrum antibiotics. Received vancomycin 1g and cefepime 1g in the ED.  Goal of Therapy:  Vancomycin trough level 10-15 mcg/ml  Plan:  Vancomycin 1g IV q8h, cefepime 1g IV q12h, monitor renal function and f/u c&s. Measure antibiotic drug levels at steady [address] PharmD, [place] Pager [phone] 06/23/2012,7:28 PM

## 2012-06-23 NOTE — ED Notes (Signed)
Attempted to call report to floor.  Was told RN would return my call 

## 2012-06-23 NOTE — ED Notes (Signed)
Pt refuses to keep soft collar in place.

## 2012-06-24 ENCOUNTER — Encounter (HOSPITAL_COMMUNITY): Payer: Self-pay | Admitting: Infectious Diseases

## 2012-06-24 DIAGNOSIS — G062 Extradural and subdural abscess, unspecified: Secondary | ICD-10-CM

## 2012-06-24 LAB — CBC
HCT: 41.3 % (ref 39.0–52.0)
MCH: 30.6 pg (ref 26.0–34.0)
MCV: 86.6 fL (ref 78.0–100.0)
Platelets: 260 10*3/uL (ref 150–400)
RBC: 4.77 MIL/uL (ref 4.22–5.81)
RDW: 11.9 % (ref 11.5–15.5)
WBC: 8.3 10*3/uL (ref 4.0–10.5)

## 2012-06-24 LAB — BASIC METABOLIC PANEL
CO2: 25 mEq/L (ref 19–32)
Calcium: 9.4 mg/dL (ref 8.4–10.5)
Chloride: 99 mEq/L (ref 96–112)
Creatinine, Ser: 0.64 mg/dL (ref 0.50–1.35)
Glucose, Bld: 146 mg/dL — ABNORMAL HIGH (ref 70–99)

## 2012-06-24 LAB — SEDIMENTATION RATE: Sed Rate: 39 mm/hr — ABNORMAL HIGH (ref 0–16)

## 2012-06-24 MED ORDER — POTASSIUM CHLORIDE CRYS ER 20 MEQ PO TBCR
40.0000 meq | EXTENDED_RELEASE_TABLET | Freq: Once | ORAL | Status: AC
  Administered 2012-06-24: 40 meq via ORAL
  Filled 2012-06-24: qty 2

## 2012-06-24 MED ORDER — DEXTROSE 5 % IV SOLN
2.0000 g | Freq: Two times a day (BID) | INTRAVENOUS | Status: DC
  Administered 2012-06-25 (×2): 2 g via INTRAVENOUS
  Filled 2012-06-24 (×3): qty 2

## 2012-06-24 MED ORDER — VANCOMYCIN HCL 10 G IV SOLR
1250.0000 mg | Freq: Three times a day (TID) | INTRAVENOUS | Status: DC
  Administered 2012-06-24 – 2012-07-08 (×41): 1250 mg via INTRAVENOUS
  Filled 2012-06-24 (×43): qty 1250

## 2012-06-24 NOTE — Progress Notes (Signed)
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 Otelia Sergeant, ortho. Improvement with antibiotics. Currently on Vancomycin and Cefepime. Dr. Isidoro Donning discussed with Dr. Cliffton Asters. Continue serial neuro checks, if any worsening will need aspiration/decompression, will defer to ortho-spine. Uncertain if patient would benefit from IR for aspiration for cultures. Immobilization of the C-spine with soft cervical collar.   Polysubstance abuse  Patient interested in cessation. Social work consulted, patient was counseled against substance abuse.   Nicotine abuse  Patient was strongly counseled against polysubstance abuse and nicotine abuse. He declined a nicotine patches.   Hypokalemia Replace as needed.  Prophylaxis Heparin subcutaneous.  Code Status: Full code. Family Communication: No family at bedside. Patient updated. Disposition Plan: Pending.   Consultants:  Dr. Otelia Sergeant, Ortho  Dr. Orvan Falconer, ID  Procedures:  None  Antibiotics:  Cefepime 06/23/2012 >>  Vancomycin 06/23/2012 >>  HPI/Subjective: Right finger numbness resolved. Feeling better since admission.  Objective: Filed Vitals:   06/23/12 1818 06/23/12 2120 06/23/12 2145 06/24/12 0200  BP: 146/102 138/94  120/73  Pulse: 92 95  79  Temp:  98.5 F (36.9 C)  98.2 F (36.8 C)  TempSrc:      Resp: 16 16  16   Height:   5\' 11"  (1.803 m)   Weight:   66 kg (145 lb 8.1 oz)   SpO2: 96% 100%  97%    Intake/Output Summary (Last 24 hours) at 06/24/12 0830 Last data filed at 06/24/12 0600  Gross per 24 hour  Intake   1018 ml  Output   1060 ml  Net    -42 ml   Filed Weights   06/23/12 1356 06/23/12 2145  Weight: 72.576 kg (160 lb) 66 kg (145 lb 8.1 oz)    Exam: Physical Exam: General:  Awake, Oriented, No acute distress. HEENT: EOMI. Neck: Supple CV: S1 and S2 Lungs: Clear to ascultation bilaterally Abdomen: Soft, Nontender, Nondistended, +bowel sounds. Ext: Good pulses. Trace edema.  Data Reviewed: Basic Metabolic Panel:  Lab 06/24/12 8295 06/23/12 1311  NA 136 138  K 3.3* 4.0  CL 99 100  CO2 25 26  GLUCOSE 146* 98  BUN 9 13  CREATININE 0.64 0.75  CALCIUM 9.4 9.5  MG -- --  PHOS -- --   Liver Function Tests: No results found for this basename: AST:5,ALT:5,ALKPHOS:5,BILITOT:5,PROT:5,ALBUMIN:5 in the last 168 hours No results found for this basename: LIPASE:5,AMYLASE:5 in the last 168 hours No results found for this basename: AMMONIA:5 in the last 168 hours CBC:  Lab 06/24/12 0628 06/23/12 1311  WBC 8.3 10.1  NEUTROABS -- 7.2  HGB 14.6 16.7  HCT 41.3 46.1  MCV 86.6 87.0  PLT 260 253   Cardiac Enzymes: No results found for this basename: CKTOTAL:5,CKMB:5,CKMBINDEX:5,TROPONINI:5 in the last 168 hours BNP (last 3 results) No results found for this basename: PROBNP:3 in the last 8760 hours CBG: No results found for this basename: GLUCAP:5 in the last 168 hours  Recent Results (from the past 240 hour(s))  CULTURE, BLOOD (ROUTINE X 2)     Status: Normal (Preliminary result)   Collection Time   06/23/12  1:15 PM      Component Value Range Status Comment   Specimen Description BLOOD RIGHT ARM   Final    Special Requests BOTTLES DRAWN AEROBIC AND ANAEROBIC 5CC   Final  Culture  Setup Time 06/23/2012 21:08   Final    Culture     Final    Value:        BLOOD CULTURE RECEIVED NO GROWTH TO DATE CULTURE WILL BE HELD FOR 5 DAYS BEFORE ISSUING A FINAL NEGATIVE REPORT   Report Status PENDING   Incomplete   CULTURE, BLOOD (ROUTINE X 2)     Status: Normal (Preliminary result)   Collection Time   06/23/12  1:15 PM      Component Value Range Status Comment   Specimen Description BLOOD LEFT ARM   Final    Special Requests BOTTLES DRAWN AEROBIC ONLY 10CC   Final     Culture  Setup Time 06/23/2012 21:09   Final    Culture     Final    Value:        BLOOD CULTURE RECEIVED NO GROWTH TO DATE CULTURE WILL BE HELD FOR 5 DAYS BEFORE ISSUING A FINAL NEGATIVE REPORT   Report Status PENDING   Incomplete      Studies: Mr Cervical Spine Wo Contrast  06/23/2012  *RADIOLOGY REPORT*  Clinical Data: Severe neck pain and stiffness.  Bilateral shoulder pain and upper extremity weakness since a lifting injury 2 weeks ago.  MRI CERVICAL SPINE WITHOUT CONTRAST  Technique:  Multiplanar and multiecho pulse sequences of the cervical spine, to include the craniocervical junction and cervicothoracic junction, were obtained according to standard protocol without intravenous contrast.  Comparison: CT scan dated 06/13/2012 and cervical MRI dated 04/12/2009  Findings: The patient has diskitis at C5-6 with an anterior epidural abscess and a prevertebral soft tissue abscess.  The posterior longitudinal ligament is elevated from the C4-5 level to the C6-7 level.  Prevertebral edema extends from C2-3 to C6-7.  There is narrowing of the C5-6 disc space with edema in the C5 and C6 vertebra.  There is a small erosion of the posterior-superior aspect of C6 and there is a 2.5 mm retrolisthesis of C5 on C6. The thecal sac is compressed by the abscess and there is slight compression of the spinal cord without myelopathy.  C1-2 through C4-5:  The discs are normal.  C6-7:  Small disc protrusion with accompanying osteophytes centrally and asymmetric into the left lateral recess, decreased in size since the prior MRI. 2 mm retrolisthesis of C6 on C7.  C7-T1 through T2-3:  Normal.  IMPRESSION: 1.  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 2.  Thecal sac and spinal cord are slightly compressed at C5-6 without myelopathy. 3.  2.5 mm retrolisthesis of C5 on C6. 3.  Edema in the C5 and C6 vertebra.  This could represent reactive edema or osteomyelitis.  There is a small focal  area of erosion of the posterior-superior aspect  of C6 in the midline but the margin is sclerotic suggesting this is chronic.           Emergent results were called by phone at the time of interpretation on 06/23/2012 at 9:25 a.m. to Dr. Otelia Sergeant,, who verbally acknowledged the results.   Original Report Authenticated By: Francene Boyers, M.D.     Scheduled Meds:   . ceFEPime (MAXIPIME) IV  1 g Intravenous Q12H  . heparin  5,000 Units Subcutaneous Q8H  . methocarbamol (ROBAXIN) IV  500 mg Intravenous Q6H  . sodium chloride  3 mL Intravenous Q12H  . vancomycin  1,000 mg Intravenous Q8H   Continuous Infusions:   . sodium chloride 75 mL/hr at 06/23/12 2251  Principal Problem:  *Abscess in epidural space of cervical spine Active Problems:  Cervical discitis  Polysubstance abuse  Nicotine abuse  Intractable neck pain with left UE weakness    Vasili Fok A, MD  [name] Pager [phone]. If 7PM-7AM, please contact night-coverage at [email or web address], password [facility] 06/24/2012, 8:30 AM  LOS: 1 day

## 2012-06-24 NOTE — Consult Note (Signed)
INFECTIOUS DISEASE CONSULT NOTE  Date of Admission:  06/23/2012  Date of Consult:  06/24/2012  Reason for Consult:epidural abscess Referring Physician: Rai  Impression/Recommendation Cervical Epidural Abscess Substance abuse  Would-  Continue vanco/cefepime Check HIV/acute hepatitis panel RPR, gc, chlamydia detox  Comment- would await BCx, have low suspicion to check TEE on this patient. Somewhat difficult to put a PIC in this pt with ongoing substance abuse issues. Surgical sampling would be helpful  Thank you so much for this interesting consult,   [name] [phone]  Christopher Berg is an 32 y.o. male.  HPI: 32 yo m with hx of polysubstance abuse (marijuana/cocaine/heroin/dilaudid)  and 2 weeks of back pain. He had previous CT of neck showing degenerative changes. He was then eval by ortho and found to have C5-6 discitis and epidural/prevertebral abscess. He was sent to ED and admitted on 1-21.   History reviewed. No pertinent past medical history.  History reviewed. No pertinent past surgical history.   No Known Allergies  Medications:  Scheduled:   . ceFEPime (MAXIPIME) IV  2 g Intravenous Q12H  . heparin  5,000 Units Subcutaneous Q8H  . methocarbamol (ROBAXIN) IV  500 mg Intravenous Q6H  . sodium chloride  3 mL Intravenous Q12H  . vancomycin  1,250 mg Intravenous Q8H    Total days of antibiotics 2 (vanco/cefepime)   Social History:  reports that he has been smoking.  He does not have any smokeless tobacco history on file. He reports that he drinks alcohol. He reports that he does not use illicit drugs.  No family history on file.  General ROS: occas headaches from neck pain, has had dysphagia/pain in neck, no f/c, normal BM, normal urination, has had paresthesias of R hand, see HPI  Blood pressure 118/75, pulse 77, temperature 98 F (36.7 C), temperature source Oral, resp. rate 18, height 5\' 11"  (1.803 m), weight 66 kg (145 lb 8.1 oz), SpO2  98.00%. General appearance: alert, cooperative and no distress Eyes: negative findings: pupils equal, round, reactive to light and accomodation Throat: normal findings: oropharynx pink & moist without lesions or evidence of thrush Neck: no adenopathy and supple, symmetrical, trachea midline Lungs: clear to auscultation bilaterally Heart: regular rate and rhythm Abdomen: normal findings: bowel sounds normal and soft, non-tender Extremities: edema none and no nail bed lesions Neurologic: Sensory: normal, normal light touch LE bilaterally Motor: 5/5 plantar/dorsiflexion   Results for orders placed during the hospital encounter of 06/23/12 (from the past 48 hour(s))  CBC WITH DIFFERENTIAL     Status: Abnormal   Collection Time   06/23/12  1:11 PM      Component Value Range Comment   WBC 10.1  4.0 - 10.5 K/uL    RBC 5.30  4.22 - 5.81 MIL/uL    Hemoglobin 16.7  13.0 - 17.0 g/dL    HCT 21.3  08.6 - 57.8 %    MCV 87.0  78.0 - 100.0 fL    MCH 31.5  26.0 - 34.0 pg    MCHC 36.2 (*) 30.0 - 36.0 g/dL    RDW 46.9  62.9 - 52.8 %    Platelets 253  150 - 400 K/uL    Neutrophils Relative 72  43 - 77 %    Neutro Abs 7.2  1.7 - 7.7 K/uL    Lymphocytes Relative 20  12 - 46 %    Lymphs Abs 2.1  0.7 - 4.0 K/uL    Monocytes Relative 7  3 - 12 %  Monocytes Absolute 0.8  0.1 - 1.0 K/uL    Eosinophils Relative 0  0 - 5 %    Eosinophils Absolute 0.0  0.0 - 0.7 K/uL    Basophils Relative 0  0 - 1 %    Basophils Absolute 0.0  0.0 - 0.1 K/uL   BASIC METABOLIC PANEL     Status: Normal   Collection Time   06/23/12  1:11 PM      Component Value Range Comment   Sodium 138  135 - 145 mEq/L    Potassium 4.0  3.5 - 5.1 mEq/L    Chloride 100  96 - 112 mEq/L    CO2 26  19 - 32 mEq/L    Glucose, Bld 98  70 - 99 mg/dL    BUN 13  6 - 23 mg/dL    Creatinine, Ser 7.82  0.50 - 1.35 mg/dL    Calcium 9.5  8.4 - 95.6 mg/dL    GFR calc non Af Amer >90  >90 mL/min    GFR calc Af Amer >90  >90 mL/min   CULTURE, BLOOD  (ROUTINE X 2)     Status: Normal (Preliminary result)   Collection Time   06/23/12  1:15 PM      Component Value Range Comment   Specimen Description BLOOD RIGHT ARM      Special Requests BOTTLES DRAWN AEROBIC AND ANAEROBIC 5CC      Culture  Setup Time 06/23/2012 21:08      Culture        Value:        BLOOD CULTURE RECEIVED NO GROWTH TO DATE CULTURE WILL BE HELD FOR 5 DAYS BEFORE ISSUING A FINAL NEGATIVE REPORT   Report Status PENDING     CULTURE, BLOOD (ROUTINE X 2)     Status: Normal (Preliminary result)   Collection Time   06/23/12  1:15 PM      Component Value Range Comment   Specimen Description BLOOD LEFT ARM      Special Requests BOTTLES DRAWN AEROBIC ONLY 10CC      Culture  Setup Time 06/23/2012 21:09      Culture        Value:        BLOOD CULTURE RECEIVED NO GROWTH TO DATE CULTURE WILL BE HELD FOR 5 DAYS BEFORE ISSUING A FINAL NEGATIVE REPORT   Report Status PENDING     URINALYSIS, ROUTINE W REFLEX MICROSCOPIC     Status: Abnormal   Collection Time   06/23/12  1:46 PM      Component Value Range Comment   Color, Urine YELLOW  YELLOW    APPearance TURBID (*) CLEAR    Specific Gravity, Urine 1.025  1.005 - 1.030    pH 8.0  5.0 - 8.0    Glucose, UA NEGATIVE  NEGATIVE mg/dL    Hgb urine dipstick NEGATIVE  NEGATIVE    Bilirubin Urine NEGATIVE  NEGATIVE    Ketones, ur NEGATIVE  NEGATIVE mg/dL    Protein, ur NEGATIVE  NEGATIVE mg/dL    Urobilinogen, UA 1.0  0.0 - 1.0 mg/dL    Nitrite NEGATIVE  NEGATIVE    Leukocytes, UA TRACE (*) NEGATIVE   URINE RAPID DRUG SCREEN (HOSP PERFORMED)     Status: Abnormal   Collection Time   06/23/12  1:46 PM      Component Value Range Comment   Opiates NONE DETECTED  NONE DETECTED    Cocaine NONE DETECTED  NONE DETECTED  Benzodiazepines NONE DETECTED  NONE DETECTED    Amphetamines NONE DETECTED  NONE DETECTED    Tetrahydrocannabinol POSITIVE (*) NONE DETECTED    Barbiturates NONE DETECTED  NONE DETECTED   URINE MICROSCOPIC-ADD ON      Status: Abnormal   Collection Time   06/23/12  1:46 PM      Component Value Range Comment   Squamous Epithelial / LPF RARE  RARE    WBC, UA 0-2  <3 WBC/hpf    Bacteria, UA FEW (*) RARE    Urine-Other AMORPHOUS URATES/PHOSPHATES     BASIC METABOLIC PANEL     Status: Abnormal   Collection Time   06/24/12  6:28 AM      Component Value Range Comment   Sodium 136  135 - 145 mEq/L    Potassium 3.3 (*) 3.5 - 5.1 mEq/L    Chloride 99  96 - 112 mEq/L    CO2 25  19 - 32 mEq/L    Glucose, Bld 146 (*) 70 - 99 mg/dL    BUN 9  6 - 23 mg/dL    Creatinine, Ser 0.98  0.50 - 1.35 mg/dL    Calcium 9.4  8.4 - 11.9 mg/dL    GFR calc non Af Amer >90  >90 mL/min    GFR calc Af Amer >90  >90 mL/min   CBC     Status: Normal   Collection Time   06/24/12  6:28 AM      Component Value Range Comment   WBC 8.3  4.0 - 10.5 K/uL    RBC 4.77  4.22 - 5.81 MIL/uL    Hemoglobin 14.6  13.0 - 17.0 g/dL    HCT 14.7  82.9 - 56.2 %    MCV 86.6  78.0 - 100.0 fL    MCH 30.6  26.0 - 34.0 pg    MCHC 35.4  30.0 - 36.0 g/dL    RDW 13.0  86.5 - 78.4 %    Platelets 260  150 - 400 K/uL   VANCOMYCIN, TROUGH     Status: Abnormal   Collection Time   06/24/12  1:18 PM      Component Value Range Comment   Vancomycin Tr 8.7 (*) 10.0 - 20.0 ug/mL       Component Value Date/Time   SDES BLOOD RIGHT ARM 06/23/2012 1315   SDES BLOOD LEFT ARM 06/23/2012 1315   SPECREQUEST BOTTLES DRAWN AEROBIC AND ANAEROBIC 5CC 06/23/2012 1315   SPECREQUEST BOTTLES DRAWN AEROBIC ONLY 10CC 06/23/2012 1315   CULT        BLOOD CULTURE RECEIVED NO GROWTH TO DATE CULTURE WILL BE HELD FOR 5 DAYS BEFORE ISSUING A FINAL NEGATIVE REPORT 06/23/2012 1315   CULT        BLOOD CULTURE RECEIVED NO GROWTH TO DATE CULTURE WILL BE HELD FOR 5 DAYS BEFORE ISSUING A FINAL NEGATIVE REPORT 06/23/2012 1315   REPTSTATUS PENDING 06/23/2012 1315   REPTSTATUS PENDING 06/23/2012 1315   Mr Cervical Spine Wo Contrast  06/23/2012  *RADIOLOGY REPORT*  Clinical Data: Severe neck pain  and stiffness.  Bilateral shoulder pain and upper extremity weakness since a lifting injury 2 weeks ago.  MRI CERVICAL SPINE WITHOUT CONTRAST  Technique:  Multiplanar and multiecho pulse sequences of the cervical spine, to include the craniocervical junction and cervicothoracic junction, were obtained according to standard protocol without intravenous contrast.  Comparison: CT scan dated 06/13/2012 and cervical MRI dated 04/12/2009  Findings: The patient has diskitis at C5-6 with  an anterior epidural abscess and a prevertebral soft tissue abscess.  The posterior longitudinal ligament is elevated from the C4-5 level to the C6-7 level.  Prevertebral edema extends from C2-3 to C6-7.  There is narrowing of the C5-6 disc space with edema in the C5 and C6 vertebra.  There is a small erosion of the posterior-superior aspect of C6 and there is a 2.5 mm retrolisthesis of C5 on C6. The thecal sac is compressed by the abscess and there is slight compression of the spinal cord without myelopathy.  C1-2 through C4-5:  The discs are normal.  C6-7:  Small disc protrusion with accompanying osteophytes centrally and asymmetric into the left lateral recess, decreased in size since the prior MRI. 2 mm retrolisthesis of C6 on C7.  C7-T1 through T2-3:  Normal.  IMPRESSION: 1.  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 2.  Thecal sac and spinal cord are slightly compressed at C5-6 without myelopathy. 3.  2.5 mm retrolisthesis of C5 on C6. 3.  Edema in the C5 and C6 vertebra.  This could represent reactive edema or osteomyelitis.  There is a small focal area of erosion of the posterior-superior aspect  of C6 in the midline but the margin is sclerotic suggesting this is chronic.           Emergent results were called by phone at the time of interpretation on 06/23/2012 at 9:25 a.m. to Dr. Otelia Sergeant,, who verbally acknowledged the results.   Original Report Authenticated By: Francene Boyers, M.D.    Recent  Results (from the past 240 hour(s))  CULTURE, BLOOD (ROUTINE X 2)     Status: Normal (Preliminary result)   Collection Time   06/23/12  1:15 PM      Component Value Range Status Comment   Specimen Description BLOOD RIGHT ARM   Final    Special Requests BOTTLES DRAWN AEROBIC AND ANAEROBIC 5CC   Final    Culture  Setup Time 06/23/2012 21:08   Final    Culture     Final    Value:        BLOOD CULTURE RECEIVED NO GROWTH TO DATE CULTURE WILL BE HELD FOR 5 DAYS BEFORE ISSUING A FINAL NEGATIVE REPORT   Report Status PENDING   Incomplete   CULTURE, BLOOD (ROUTINE X 2)     Status: Normal (Preliminary result)   Collection Time   06/23/12  1:15 PM      Component Value Range Status Comment   Specimen Description BLOOD LEFT ARM   Final    Special Requests BOTTLES DRAWN AEROBIC ONLY 10CC   Final    Culture  Setup Time 06/23/2012 21:09   Final    Culture     Final    Value:        BLOOD CULTURE RECEIVED NO GROWTH TO DATE CULTURE WILL BE HELD FOR 5 DAYS BEFORE ISSUING A FINAL NEGATIVE REPORT   Report Status PENDING   Incomplete       06/24/2012, 3:53 PM     LOS: 1 day

## 2012-06-24 NOTE — Progress Notes (Signed)
Orthopedic Tech Progress Note Patient Details:  Christopher Berg [birth year] [id number]  Ortho Devices Type of Ortho Device: Soft collar Ortho Device/Splint Interventions: Application   [name], Mickie Bail 06/24/2012, 12:33 PM

## 2012-06-24 NOTE — Progress Notes (Signed)
Utilization review completed.  P.J. Shan Padgett,RN,BSN Case Manager [phone]  

## 2012-06-24 NOTE — Progress Notes (Signed)
ANTIBIOTIC CONSULT NOTE - FOLLOW UP  Pharmacy Consult:  Vancomycin Indication:  epidural abscess with diskitis  No Known Allergies  Patient Measurements: Height: 5\' 11"  (180.3 cm) Weight: 145 lb 8.1 oz (66 kg) IBW/kg (Calculated) : 75.3   Intake/Output from previous day: 01/21 0701 - 01/22 0700 In: 1018 [P.O.:360; I.V.:603; IV Piggyback:55] Out: 1060 [Urine:1060]  Labs:  [facility], [place] 06/24/12 0628 06/23/12 1311  WBC 8.3 10.1  HGB 14.6 16.7  PLT 260 253  LABCREA -- --  CREATININE 0.64 0.75   Estimated Creatinine Clearance: 126 ml/min (by C-G formula based on Cr of 0.64).  Basename 06/24/12 1318  VANCOTROUGH 8.7*  VANCOPEAK --  VANCORANDOM --  GENTTROUGH --  GENTPEAK --  GENTRANDOM --  TOBRATROUGH --  TOBRAPEAK --  TOBRARND --  AMIKACINPEAK --  AMIKACINTROU --  AMIKACIN --     Microbiology: Recent Results (from the past 720 hour(s))  CULTURE, BLOOD (ROUTINE X 2)     Status: Normal (Preliminary result)   Collection Time   06/23/12  1:15 PM      Component Value Range Status Comment   Specimen Description BLOOD RIGHT ARM   Final    Special Requests BOTTLES DRAWN AEROBIC AND ANAEROBIC 5CC   Final    Culture  Setup Time 06/23/2012 21:08   Final    Culture     Final    Value:        BLOOD CULTURE RECEIVED NO GROWTH TO DATE CULTURE WILL BE HELD FOR 5 DAYS BEFORE ISSUING A FINAL NEGATIVE REPORT   Report Status PENDING   Incomplete   CULTURE, BLOOD (ROUTINE X 2)     Status: Normal (Preliminary result)   Collection Time   06/23/12  1:15 PM      Component Value Range Status Comment   Specimen Description BLOOD LEFT ARM   Final    Special Requests BOTTLES DRAWN AEROBIC ONLY 10CC   Final    Culture  Setup Time 06/23/2012 21:09   Final    Culture     Final    Value:        BLOOD CULTURE RECEIVED NO GROWTH TO DATE CULTURE WILL BE HELD FOR 5 DAYS BEFORE ISSUING A FINAL NEGATIVE REPORT   Report Status PENDING   Incomplete         Assessment: 31 YOM with epidural abscess  with diskitis to continue on vancomycin and cefepime.  Vancomycin trough sub-therapeutic and patient's renal function has been stable.  Expect vancomycin to accumulate; therefore, will increase dose conservatively.   Goal of Therapy:  Vancomycin trough level 15-20 mcg/ml   Plan:  - Increase vanc to 1250mg  IV Q8H - Continue Cefepime 2gm IV Q12H - Continue to monitor renal function and vanc trough     Kacey Dysert D. Laney Potash, PharmD, BCPS Pager:  [phone] 06/24/2012, 2:23 PM

## 2012-06-24 NOTE — Progress Notes (Signed)
Patient ID: Christopher Berg, male   DOB: 04/04/1981, 32 y.o.   MRN: [id number] Subjective:  Pain is improved, the left arm is working better.  ID has seen and evaluated. Note appreciated.  Patient reports pain as mild.    Objective:   VITALS:  Temp:  [98 F (36.7 C)-98.5 F (36.9 C)] 98 F (36.7 C) (01/22 1418) Pulse Rate:  [77-95] 77  (01/22 1418) Resp:  [16-18] 18  (01/22 1418) BP: (118-138)/(73-94) 118/75 mmHg (01/22 1418) SpO2:  [97 %-100 %] 98 % (01/22 1418) Weight:  [66 kg (145 lb 8.1 oz)] 66 kg (145 lb 8.1 oz) (01/21 2145)  ABD soft No cellulitis present Left upper extremity shows improved biceps and shoulder abduction strength.   LABS  Basename 06/24/12 0628 06/23/12 1311  HGB 14.6 16.7  WBC 8.3 10.1  PLT 260 253    Basename 06/24/12 0628 06/23/12 1311  NA 136 138  K 3.3* 4.0  CL 99 100  CO2 25 26  BUN 9 13  CREATININE 0.64 0.75  GLUCOSE 146* 98   No results found for this basename: LABPT:2,INR:2 in the last 72 hours   Assessment/Plan:    C5-6 cervical discitis with epidural abscess, clinically improved weakness. Unusual in that he is not septic and the process though significant is improved with Only 24 hours of antibiotics, WBC of 10-11k not a very impressive immune reaction to  Discitis and abscess?  Continue ABX therapy due to documented cervical discitis and epidural abscess. Soft collar for comfort.  Christopher Berg 06/24/2012, 8:03 PM

## 2012-06-25 LAB — RPR: RPR Ser Ql: NONREACTIVE

## 2012-06-25 LAB — VANCOMYCIN, TROUGH: Vancomycin Tr: 14.3 ug/mL (ref 10.0–20.0)

## 2012-06-25 LAB — BASIC METABOLIC PANEL
BUN: 9 mg/dL (ref 6–23)
CO2: 27 mEq/L (ref 19–32)
Chloride: 99 mEq/L (ref 96–112)
Glucose, Bld: 111 mg/dL — ABNORMAL HIGH (ref 70–99)
Potassium: 4.5 mEq/L (ref 3.5–5.1)
Sodium: 138 mEq/L (ref 135–145)

## 2012-06-25 LAB — C-REACTIVE PROTEIN: CRP: 1.4 mg/dL — ABNORMAL HIGH (ref <0.60)

## 2012-06-25 LAB — CBC
HCT: 43.5 % (ref 39.0–52.0)
Hemoglobin: 15.6 g/dL (ref 13.0–17.0)
MCHC: 35.9 g/dL (ref 30.0–36.0)
RBC: 5.02 MIL/uL (ref 4.22–5.81)

## 2012-06-25 LAB — URINE CULTURE

## 2012-06-25 LAB — HEPATITIS PANEL, ACUTE: Hepatitis B Surface Ag: NEGATIVE

## 2012-06-25 MED ORDER — SODIUM CHLORIDE 0.9 % IJ SOLN
10.0000 mL | INTRAMUSCULAR | Status: DC | PRN
  Administered 2012-06-25 – 2012-07-08 (×6): 10 mL

## 2012-06-25 MED ORDER — CEFTRIAXONE SODIUM 2 G IJ SOLR
2.0000 g | INTRAMUSCULAR | Status: DC
  Administered 2012-06-25 – 2012-07-08 (×14): 2 g via INTRAVENOUS
  Filled 2012-06-25 (×15): qty 2

## 2012-06-25 NOTE — Progress Notes (Signed)
Subjective:     Patient reports pain as mild.  Much improved today.  Minimal arm  Pain Seen and examined with DR Otelia Sergeant.  Pt without neurologic symptoms today  Objective: Vital signs in last 24 hours: Temp:  [98 F (36.7 C)-98.2 F (36.8 C)] 98 F (36.7 C) (01/23 0529) Pulse Rate:  [73-82] 73  (01/23 0529) Resp:  [18-20] 20  (01/23 0529) BP: (116-125)/(75-82) 125/82 mmHg (01/23 0529) SpO2:  [98 %] 98 % (01/23 0529)  Intake/Output from previous day: 01/22 0701 - 01/23 0700 In: -  Out: 1450 [Urine:1450] Intake/Output this shift: Total I/O In: 360 [P.O.:360] Out: 400 [Urine:400]   Basename 06/25/12 0715 06/24/12 0628 06/23/12 1311  HGB 15.6 14.6 16.7    Basename 06/25/12 0715 06/24/12 0628  WBC 7.8 8.3  RBC 5.02 4.77  HCT 43.5 41.3  PLT 244 260    Basename 06/25/12 0715 06/24/12 0628  NA 138 136  K 4.5 3.3*  CL 99 99  CO2 27 25  BUN 9 9  CREATININE 0.77 0.64  GLUCOSE 111* 146*  CALCIUM 9.9 9.4   No results found for this basename: LABPT:2,INR:2 in the last 72 hours  Neurologically intact with no focal weakness in the upper extremities.  Assessment/Plan:     Up with therapy Agree with continued abx treatment plan.  Aurielle Slingerland M 06/25/2012, 1:24 PM

## 2012-06-25 NOTE — Progress Notes (Signed)
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 Otelia Sergeant, ortho. Improvement with antibiotics. Currently on Vancomycin and Cefepime. Dr. Ninetta Lights, ID, transitioned cefepime to ceftriaxone. Discussed with interventional radiology, no amendable to a tap. Immobilization of the C-spine with soft cervical collar while seated or up with activity. Insert PICC line.  Polysubstance abuse  Patient interested in cessation. Social work consulted, patient was counseled against substance abuse.   Nicotine abuse  Patient was strongly counseled against polysubstance abuse and nicotine abuse. He declined a nicotine patches.   Hypokalemia Replace as needed.  Prophylaxis Heparin subcutaneous.  Code Status: Full code. Family Communication: No family at bedside. Patient updated. Disposition Plan: Pending. DC to SNF tomorrow?   Consultants:  Dr. Otelia Sergeant, Ortho  Dr. Ninetta Lights, ID  Procedures:  None  Antibiotics:  Cefepime 06/23/2012 >> 06/25/2012  Vancomycin 06/23/2012 >>  Ceftriaxone 06/25/2012 >>  HPI/Subjective: Feeling better. No specific concerns.  Objective: Filed Vitals:   06/24/12 0200 06/24/12 1418 06/24/12 2137 06/25/12 0529  BP: 120/73 118/75 116/81 125/82  Pulse: 79 77 82 73  Temp: 98.2 F (36.8 C) 98 F (36.7 C) 98.2 F (36.8 C) 98 F (36.7 C)  TempSrc:  Oral Oral Oral  Resp: 16 18 18 20   Height:      Weight:      SpO2: 97% 98% 98% 98%    Intake/Output Summary (Last 24 hours) at 06/25/12 0848 Last data filed at 06/25/12 0533  Gross per 24 hour  Intake      0 ml  Output   1450 ml  Net  -1450 ml   Filed Weights   06/23/12 1356 06/23/12 2145  Weight: 72.576 kg (160 lb) 66 kg (145 lb 8.1 oz)    Exam: Physical Exam: General: Awake,  Oriented, No acute distress. HEENT: EOMI. Neck: Supple CV: S1 and S2 Lungs: Clear to ascultation bilaterally Abdomen: Soft, Nontender, Nondistended, +bowel sounds. Ext: Good pulses. Trace edema.  Data Reviewed: Basic Metabolic Panel:  Lab 06/25/12 2130 06/24/12 0628 06/23/12 1311  NA 138 136 138  K 4.5 3.3* 4.0  CL 99 99 100  CO2 27 25 26   GLUCOSE 111* 146* 98  BUN 9 9 13   CREATININE 0.77 0.64 0.75  CALCIUM 9.9 9.4 9.5  MG 2.1 -- --  PHOS -- -- --   Liver Function Tests: No results found for this basename: AST:5,ALT:5,ALKPHOS:5,BILITOT:5,PROT:5,ALBUMIN:5 in the last 168 hours No results found for this basename: LIPASE:5,AMYLASE:5 in the last 168 hours No results found for this basename: AMMONIA:5 in the last 168 hours CBC:  Lab 06/25/12 0715 06/24/12 0628 06/23/12 1311  WBC 7.8 8.3 10.1  NEUTROABS -- -- 7.2  HGB 15.6 14.6 16.7  HCT 43.5 41.3 46.1  MCV 86.7 86.6 87.0  PLT 244 260 253   Cardiac Enzymes: No results found for this basename: CKTOTAL:5,CKMB:5,CKMBINDEX:5,TROPONINI:5 in the last 168 hours BNP (last 3 results) No results found for this basename: PROBNP:3 in the last 8760 hours CBG: No results found for this basename: GLUCAP:5 in the last 168 hours  Recent Results (from the past 240 hour(s))  CULTURE, BLOOD (ROUTINE X 2)     Status: Normal (Preliminary result)   Collection Time   06/23/12  1:15 PM      Component Value Range Status Comment   Specimen Description BLOOD RIGHT ARM  Final    Special Requests BOTTLES DRAWN AEROBIC AND ANAEROBIC 5CC   Final    Culture  Setup Time 06/23/2012 21:08   Final    Culture     Final    Value:        BLOOD CULTURE RECEIVED NO GROWTH TO DATE CULTURE WILL BE HELD FOR 5 DAYS BEFORE ISSUING A FINAL NEGATIVE REPORT   Report Status PENDING   Incomplete   CULTURE, BLOOD (ROUTINE X 2)     Status: Normal (Preliminary result)   Collection Time   06/23/12  1:15 PM      Component Value Range Status Comment   Specimen Description  BLOOD LEFT ARM   Final    Special Requests BOTTLES DRAWN AEROBIC ONLY 10CC   Final    Culture  Setup Time 06/23/2012 21:09   Final    Culture     Final    Value:        BLOOD CULTURE RECEIVED NO GROWTH TO DATE CULTURE WILL BE HELD FOR 5 DAYS BEFORE ISSUING A FINAL NEGATIVE REPORT   Report Status PENDING   Incomplete   URINE CULTURE     Status: Normal   Collection Time   06/23/12 11:09 PM      Component Value Range Status Comment   Specimen Description URINE, RANDOM   Final    Special Requests NONE   Final    Culture  Setup Time 06/23/2012 23:42   Final    Colony Count NO GROWTH   Final    Culture NO GROWTH   Final    Report Status 06/25/2012 FINAL   Final      Studies: No results found.  Scheduled Meds:    . ceFEPime (MAXIPIME) IV  2 g Intravenous Q12H  . heparin  5,000 Units Subcutaneous Q8H  . methocarbamol (ROBAXIN) IV  500 mg Intravenous Q6H  . sodium chloride  3 mL Intravenous Q12H  . vancomycin  1,250 mg Intravenous Q8H   Continuous Infusions:    . sodium chloride 999 mL (06/24/12 4540)    Principal Problem:  *Abscess in epidural space of cervical spine Active Problems:  Cervical discitis  Polysubstance abuse  Nicotine abuse  Intractable neck pain with left UE weakness    Christopher Berg A, MD  [name] Pager [phone]. If 7PM-7AM, please contact night-coverage at [email or web address], password [facility] 06/25/2012, 8:48 AM  LOS: 2 days

## 2012-06-25 NOTE — Progress Notes (Signed)
ANTIBIOTIC CONSULT NOTE - FOLLOW UP  Pharmacy Consult for Vancomycin Indication: cervical epidural abscess  No Known Allergies  Patient Measurements: Height: 5\' 11"  (180.3 cm) Weight: 145 lb 8.1 oz (66 kg) IBW/kg (Calculated) : 75.3   Vital Signs: Temp: 98.1 F (36.7 C) (01/23 1340) Temp src: Oral (01/23 1340) BP: 118/78 mmHg (01/23 1340) Pulse Rate: 94  (01/23 1340) Intake/Output from previous day: 01/22 0701 - 01/23 0700 In: -  Out: 1450 [Urine:1450] Intake/Output from this shift:    Labs:  Basename 06/25/12 0715 06/24/12 0628 06/23/12 1311  WBC 7.8 8.3 10.1  HGB 15.6 14.6 16.7  PLT 244 260 253  LABCREA -- -- --  CREATININE 0.77 0.64 0.75   Estimated Creatinine Clearance: 126 ml/min (by C-G formula based on Cr of 0.77).  Basename 06/25/12 2030 06/24/12 1318  VANCOTROUGH 14.3 8.7*  VANCOPEAK -- --  Drue Dun -- --  GENTTROUGH -- --  GENTPEAK -- --  GENTRANDOM -- --  TOBRATROUGH -- --  TOBRAPEAK -- --  TOBRARND -- --  AMIKACINPEAK -- --  AMIKACINTROU -- --  AMIKACIN -- --     Microbiology: Recent Results (from the past 720 hour(s))  CULTURE, BLOOD (ROUTINE X 2)     Status: Normal (Preliminary result)   Collection Time   06/23/12  1:15 PM      Component Value Range Status Comment   Specimen Description BLOOD RIGHT ARM   Final    Special Requests BOTTLES DRAWN AEROBIC AND ANAEROBIC 5CC   Final    Culture  Setup Time 06/23/2012 21:08   Final    Culture     Final    Value:        BLOOD CULTURE RECEIVED NO GROWTH TO DATE CULTURE WILL BE HELD FOR 5 DAYS BEFORE ISSUING A FINAL NEGATIVE REPORT   Report Status PENDING   Incomplete   CULTURE, BLOOD (ROUTINE X 2)     Status: Normal (Preliminary result)   Collection Time   06/23/12  1:15 PM      Component Value Range Status Comment   Specimen Description BLOOD LEFT ARM   Final    Special Requests BOTTLES DRAWN AEROBIC ONLY 10CC   Final    Culture  Setup Time 06/23/2012 21:09   Final    Culture     Final    Value:        BLOOD CULTURE RECEIVED NO GROWTH TO DATE CULTURE WILL BE HELD FOR 5 DAYS BEFORE ISSUING A FINAL NEGATIVE REPORT   Report Status PENDING   Incomplete   URINE CULTURE     Status: Normal   Collection Time   06/23/12 11:09 PM      Component Value Range Status Comment   Specimen Description URINE, RANDOM   Final    Special Requests NONE   Final    Culture  Setup Time 06/23/2012 23:42   Final    Colony Count NO GROWTH   Final    Culture NO GROWTH   Final    Report Status 06/25/2012 FINAL   Final     Anti-infectives     Start     Dose/Rate Route Frequency Ordered Stop   06/25/12 1200   cefTRIAXone (ROCEPHIN) 2 g in dextrose 5 % 50 mL IVPB        2 g 100 mL/hr over 30 Minutes Intravenous Every 24 hours 06/25/12 1102     06/24/12 2200   ceFEPIme (MAXIPIME) 2 g in dextrose 5 % 50 mL  IVPB  Status:  Discontinued        2 g 100 mL/hr over 30 Minutes Intravenous Every 12 hours 06/24/12 1027 06/25/12 1102   06/24/12 2100   vancomycin (VANCOCIN) 1,250 mg in sodium chloride 0.9 % 250 mL IVPB        1,250 mg 166.7 mL/hr over 90 Minutes Intravenous Every 8 hours 06/24/12 1426     06/23/12 2200   vancomycin (VANCOCIN) IVPB 1000 mg/200 mL premix  Status:  Discontinued        1,000 mg 200 mL/hr over 60 Minutes Intravenous Every 8 hours 06/23/12 1933 06/24/12 1425   06/23/12 1400   ceFEPIme (MAXIPIME) 1 g in dextrose 5 % 50 mL IVPB  Status:  Discontinued        1 g 100 mL/hr over 30 Minutes Intravenous Every 12 hours 06/23/12 1238 06/24/12 1027   06/23/12 1245   vancomycin (VANCOCIN) IVPB 1000 mg/200 mL premix        1,000 mg 200 mL/hr over 60 Minutes Intravenous  Once 06/23/12 1237 06/23/12 1851   06/23/12 1245   metroNIDAZOLE (FLAGYL) IVPB 500 mg        500 mg 100 mL/hr over 60 Minutes Intravenous  Once 06/23/12 1238 06/23/12 1505          Assessment: 32 y/o male on day 3 vancomycin for a cervical epidural abscess. Vancomycin trough tonight is just below the therapeutic  range however this trough is higher than expected with previous small dose increase. The trough will likely be >15 when next checked. So far renal function is stable. Cultures ngtd, afebrile, and WBC are normal.  Goal of Therapy:  Vancomycin trough level 15-20 mcg/ml  Plan:  -Continue vancomycin 1250 mg IV q8h -Check trough in several days to ensure >15 -Follow-up renal function and [employer], [address].D., BCPS Clinical Pharmacist Pager: [phone] 06/25/2012 9:44 PM

## 2012-06-25 NOTE — Progress Notes (Signed)
Patient examined and lab reviewed with Vernon PA-C. 

## 2012-06-25 NOTE — Progress Notes (Signed)
Peripherally Inserted Central Catheter/Midline Placement  The IV Nurse has discussed with the patient and/or persons authorized to consent for the patient, the purpose of this procedure and the potential benefits and risks involved with this procedure.  The benefits include less needle sticks, lab draws from the catheter and patient may be discharged home with the catheter.  Risks include, but not limited to, infection, bleeding, blood clot (thrombus formation), and puncture of an artery; nerve damage and irregular heat beat.  Alternatives to this procedure were also discussed.  PICC/Midline Placement Documentation  PICC / Midline Single Lumen 06/25/12 PICC Right Basilic (Active)       Stacie Glaze Horton 06/25/2012, 3:12 PM

## 2012-06-25 NOTE — Clinical Social Work Psychosocial (Signed)
     Clinical Social Work Department BRIEF PSYCHOSOCIAL ASSESSMENT 06/25/2012  Patient:  Christopher Berg, Christopher Berg     Account Number:  [id number]     Admit date:  06/23/2012  Clinical Social Worker:  Peggyann Shoals  Date/Time:  06/25/2012 02:32 PM  Referred by:  Physician  Date Referred:  06/25/2012 Referred for  SNF Placement   Other Referral:   Interview type:  Patient Other interview type:    PSYCHOSOCIAL DATA Living Status:  FAMILY Admitted from facility:   Level of care:   Primary support name:  Arville Go Primary support relationship to patient:  PARENT Degree of support available:   supportive    CURRENT CONCERNS Current Concerns  Post-Acute Placement  Substance Abuse   Other Concerns:    SOCIAL WORK ASSESSMENT / PLAN CSW met with pt to address consult. CSW introduced herself and explained role of social work. CSW also explained process of SNF placement.    Pt lives with his parents. Pt shared that he has a history of IV drug use and THC. Pt endorsed using ETOH, however uses rarely. Pt shared that he has children, ages 27 and 2, who live with their mother.    CSW completed SBIRT.    CSW will initiate SNF search and follow up with bed offers. CSW will continue to follow.   Assessment/plan status:  Psychosocial Support/Ongoing Assessment of Needs Other assessment/ plan:   Information/referral to community resources:   SNF List    PATIENTS/FAMILYS RESPONSE TO PLAN OF CARE: Pt is agreeable to discharge plan.

## 2012-06-25 NOTE — Progress Notes (Signed)
INFECTIOUS DISEASE PROGRESS NOTE  ID: Christopher Berg is a 32 y.o. male with   Principal Problem:  *Abscess in epidural space of cervical spine Active Problems:  Cervical discitis  Polysubstance abuse  Nicotine abuse  Intractable neck pain with left UE weakness  Subjective: Resting quietly, awakens easily. Continued neck pain.   Abtx:  Anti-infectives     Start     Dose/Rate Route Frequency Ordered Stop   06/25/12 1200   cefTRIAXone (ROCEPHIN) 2 g in dextrose 5 % 50 mL IVPB        2 g 100 mL/hr over 30 Minutes Intravenous Every 24 hours 06/25/12 1102     06/24/12 2200   ceFEPIme (MAXIPIME) 2 g in dextrose 5 % 50 mL IVPB  Status:  Discontinued        2 g 100 mL/hr over 30 Minutes Intravenous Every 12 hours 06/24/12 1027 06/25/12 1102   06/24/12 2100   vancomycin (VANCOCIN) 1,250 mg in sodium chloride 0.9 % 250 mL IVPB        1,250 mg 166.7 mL/hr over 90 Minutes Intravenous Every 8 hours 06/24/12 1426     06/23/12 2200   vancomycin (VANCOCIN) IVPB 1000 mg/200 mL premix  Status:  Discontinued        1,000 mg 200 mL/hr over 60 Minutes Intravenous Every 8 hours 06/23/12 1933 06/24/12 1425   06/23/12 1400   ceFEPIme (MAXIPIME) 1 g in dextrose 5 % 50 mL IVPB  Status:  Discontinued        1 g 100 mL/hr over 30 Minutes Intravenous Every 12 hours 06/23/12 1238 06/24/12 1027   06/23/12 1245   vancomycin (VANCOCIN) IVPB 1000 mg/200 mL premix        1,000 mg 200 mL/hr over 60 Minutes Intravenous  Once 06/23/12 1237 06/23/12 1851   06/23/12 1245   metroNIDAZOLE (FLAGYL) IVPB 500 mg        500 mg 100 mL/hr over 60 Minutes Intravenous  Once 06/23/12 1238 06/23/12 1505          Medications:  Scheduled:   . cefTRIAXone (ROCEPHIN)  IV  2 g Intravenous Q24H  . heparin  5,000 Units Subcutaneous Q8H  . methocarbamol (ROBAXIN) IV  500 mg Intravenous Q6H  . sodium chloride  3 mL Intravenous Q12H  . vancomycin  1,250 mg Intravenous Q8H    Objective: Vital signs in last 24  hours: Temp:  [98 F (36.7 C)-98.2 F (36.8 C)] 98 F (36.7 C) (01/23 0529) Pulse Rate:  [73-82] 73  (01/23 0529) Resp:  [18-20] 20  (01/23 0529) BP: (116-125)/(75-82) 125/82 mmHg (01/23 0529) SpO2:  [98 %] 98 % (01/23 0529)   General appearance: cooperative and no distress  Lab Results  Basename 06/25/12 0715 06/24/12 0628  WBC 7.8 8.3  HGB 15.6 14.6  HCT 43.5 41.3  NA 138 136  K 4.5 3.3*  CL 99 99  CO2 27 25  BUN 9 9  CREATININE 0.77 0.64  GLU -- --   Liver Panel No results found for this basename: PROT:2,ALBUMIN:2,AST:2,ALT:2,ALKPHOS:2,BILITOT:2,BILIDIR:2,IBILI:2 in the last 72 hours Sedimentation Rate  Basename 06/24/12 2038  ESRSEDRATE 39*   C-Reactive Protein No results found for this basename: CRP:2 in the last 72 hours  Microbiology: Recent Results (from the past 240 hour(s))  CULTURE, BLOOD (ROUTINE X 2)     Status: Normal (Preliminary result)   Collection Time   06/23/12  1:15 PM      Component Value Range Status Comment  Specimen Description BLOOD RIGHT ARM   Final    Special Requests BOTTLES DRAWN AEROBIC AND ANAEROBIC 5CC   Final    Culture  Setup Time 06/23/2012 21:08   Final    Culture     Final    Value:        BLOOD CULTURE RECEIVED NO GROWTH TO DATE CULTURE WILL BE HELD FOR 5 DAYS BEFORE ISSUING A FINAL NEGATIVE REPORT   Report Status PENDING   Incomplete   CULTURE, BLOOD (ROUTINE X 2)     Status: Normal (Preliminary result)   Collection Time   06/23/12  1:15 PM      Component Value Range Status Comment   Specimen Description BLOOD LEFT ARM   Final    Special Requests BOTTLES DRAWN AEROBIC ONLY 10CC   Final    Culture  Setup Time 06/23/2012 21:09   Final    Culture     Final    Value:        BLOOD CULTURE RECEIVED NO GROWTH TO DATE CULTURE WILL BE HELD FOR 5 DAYS BEFORE ISSUING A FINAL NEGATIVE REPORT   Report Status PENDING   Incomplete   URINE CULTURE     Status: Normal   Collection Time   06/23/12 11:09 PM      Component Value Range  Status Comment   Specimen Description URINE, RANDOM   Final    Special Requests NONE   Final    Culture  Setup Time 06/23/2012 23:42   Final    Colony Count NO GROWTH   Final    Culture NO GROWTH   Final    Report Status 06/25/2012 FINAL   Final     Studies/Results: No results found.   Assessment/Plan: Cervical Epidural Abscess  Substance abuse  Total days of antibiotics: 3 (vanco/ceftriaxone)    Will continue his anbx. Unfortunately not able to get fluid sample. Will need prolonged IV rx and f/u imaging. Can f/u in our clinic if needed. [phone])       [name] Infectious Diseases [phone] 06/25/2012, 11:39 AM   LOS: 2 days

## 2012-06-25 NOTE — Evaluation (Signed)
Physical Therapy Evaluation and Discharge Patient Details Name: Christopher Berg MRN: [id number] DOB: Aug 19, 1980 Today's Date: 06/25/2012 Time: 0950-1009 PT Time Calculation (min): 19 min  PT Assessment / Plan / Recommendation Clinical Impression  32 yo adm with LUE weakness, RUE numbness and found to have C5-6 discitis and epidural abscess. Has responded to IV antibiotics and currently no surgery planned. Reviewed use of collar for comfort and proper fit. Educated on benefits of incr activity and changing positions regularly with pt verbalizing understanding. Pt aware he needs to call for nursing assistance. No further acute PT needs identified and pt in agreement with d/c from PT.     PT Assessment  Patent does not need any further PT services    Follow Up Recommendations  No PT follow up    Does the patient have the potential to tolerate intense rehabilitation      Barriers to Discharge  none      Equipment Recommendations  None recommended by PT    Recommendations for Other Services     Frequency      Precautions / Restrictions Precautions Precautions: None Required Braces or Orthoses: Cervical Brace Cervical Brace: Other (comment);Soft collar;Applied in standing position (for comfort only)   Pertinent Vitals/Pain 6.5/10 neck and shoulders; pre-medicated prior to session; educated pt in proper fit/use of collar; repositioned      Mobility  Bed Mobility Bed Mobility: Supine to Sit;Sitting - Scoot to Edge of Bed;Sit to Supine Supine to Sit: 7: Independent Sitting - Scoot to Edge of Bed: 7: Independent Sit to Supine: 7: Independent Transfers Transfers: Sit to Stand;Stand to Sit Sit to Stand: 7: Independent;Without upper extremity assist;From bed Stand to Sit: 7: Independent;Without upper extremity assist;To bed Details for Transfer Assistance: no dizziness or unsteadiness Ambulation/Gait Ambulation/Gait Assistance: 7: Independent Ambulation Distance (Feet): 250  Feet Assistive device: None Ambulation/Gait Assistance Details: no deficits, reported his neck typically begins to hurt more when upright and discussed use of cervical collar for comfort and how it can help support the weight of his head. Tried soft collar and too short to unweight his neck. Pt also with more solid, molded foam collar in room and donned this with pt reporting improved support and decr pain with upright. Gait Pattern: Within Functional Limits Stairs: No         Visit Information  Last PT Received On: 06/25/12 Assistance Needed: +1    Subjective Data  Subjective: Reports he's been managing all tasks/ADLs, just painful (PTA). Denies changes in legs/feet Patient Stated Goal: decr pain/avoid surgery   Prior Functioning  Home Living Lives With: Other (Comment) (parents) Available Help at Discharge: Family;Available PRN/intermittently Type of Home: House Home Access: Stairs to enter [employer] of Steps: 3 Entrance Stairs-Rails: None Home Layout: One level Bathroom Shower/Tub: Health visitor: Standard Bathroom Accessibility: Yes How Accessible: Accessible via walker Home Adaptive Equipment: None Prior Function Level of Independence: Independent Able to Take Stairs?: Reciprically Driving: Yes Vocation: Part time employment 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[employer], clinical: No difficulties    Cognition  Overall Cognitive Status: Appears within functional limits for tasks assessed/performed Arousal/Alertness: Awake/alert Orientation Level: Appears intact for tasks assessed Behavior During Session: [facility] for tasks performed    Extremity/Trunk Assessment Right Upper Extremity Assessment RUE ROM/Strength/Tone: [facility] for tasks assessed RUE Sensation: Deficits RUE Sensation Deficits: numbness/tingling in digits 2-4--has improved and nearly normal since started antibiotics RUE Coordination: WFL - gross motor Left Upper Extremity  Assessment LUE ROM/Strength/Tone: Deficits LUE ROM/Strength/Tone Deficits: per  pt report and MD notes; no further testing indicated as able to use functionally LUE Sensation: WFL - Light Touch LUE Coordination: WFL - gross motor Right Lower Extremity Assessment RLE ROM/Strength/Tone: WFL for tasks assessed Left Lower Extremity Assessment LLE ROM/Strength/Tone: [facility] for tasks assessed Trunk Assessment Trunk Assessment: Normal   Balance    End of Session PT - End of Session Equipment Utilized During Treatment: Cervical collar Activity Tolerance: Patient tolerated treatment well Patient left: in bed;with call bell/phone within reach;with bed alarm set Nurse Communication: Mobility status  GP     Rishikesh Khachatryan 06/25/2012, 10:25 AM  Pager [phone]

## 2012-06-25 NOTE — Clinical Social Work Placement (Addendum)
    Clinical Social Work Department CLINICAL SOCIAL WORK PLACEMENT NOTE 06/25/2012  Patient:  CAMERAN, PETTEY  Account Number:  [id number] Admit date:  06/23/2012  Clinical Social Worker:  Peggyann Shoals  Date/time:  06/25/2012 02:45 PM  Clinical Social Work is seeking post-discharge placement for this patient at the following level of care:   SKILLED NURSING   (*CSW will update this form in Epic as items are completed)   06/25/2012  Patient/family provided with [facility] Department of Clinical Social Work's list of facilities offering this level of care within the geographic area requested by the patient (or if unable, by the patient's family).  06/25/2012  Patient/family informed of their freedom to choose among providers that offer the needed level of care, that participate in Medicare, Medicaid or managed care program needed by the patient, have an available bed and are willing to accept the patient.  06/25/2012  Patient/family informed of [facility]' ownership interest in [facility], as well as of the fact that they are under no obligation to receive care at this facility.  PASARR submitted to [employer] on 06/26/2012 PASARR number received from [employer] on 06/26/2012  FL2 transmitted to all facilities in geographic area requested by pt/family on  06/26/2012 FL2 transmitted to all facilities within larger geographic area on   Patient informed that his/her managed care company has contracts with or will negotiate with  certain facilities, including the following:     Patient/family informed of bed offers received:  07/08/2012 Patient chooses bed at [facility] Physician recommends and patient chooses bed at  SNF  Patient to be transferred to [facility] on 07/08/2012 Patient to be transferred to facility by [facility]  The following physician request were entered in Epic:   Additional Comments:

## 2012-06-26 MED ORDER — HEPATITIS B VAC RECOMBINANT 5 MCG/0.5ML IJ SUSP
0.5000 mL | Freq: Once | INTRAMUSCULAR | Status: AC
  Administered 2012-06-26: 5 ug via INTRAMUSCULAR
  Filled 2012-06-26: qty 0.5

## 2012-06-26 MED ORDER — VANCOMYCIN HCL 10 G IV SOLR: 1250.0000 mg | Freq: Three times a day (TID) | INTRAVENOUS | Status: DC

## 2012-06-26 MED ORDER — DEXTROSE 5 % IV SOLN: 2.0000 g | INTRAVENOUS | Status: DC

## 2012-06-26 MED ORDER — OXYCODONE HCL 5 MG PO TABS: 5.0000 mg | ORAL_TABLET | ORAL | Status: DC | PRN

## 2012-06-26 MED ORDER — HEPATITIS A VACCINE 1440 EL U/ML IM SUSP
1.0000 mL | Freq: Once | INTRAMUSCULAR | Status: DC
  Filled 2012-06-26 (×2): qty 1

## 2012-06-26 MED ORDER — ACETAMINOPHEN 325 MG PO TABS: 650.0000 mg | ORAL_TABLET | Freq: Four times a day (QID) | ORAL | Status: DC | PRN

## 2012-06-26 MED ORDER — ALUM & MAG HYDROXIDE-SIMETH 200-200-20 MG/5ML PO SUSP: 30.0000 mL | Freq: Four times a day (QID) | ORAL | Status: DC | PRN

## 2012-06-26 NOTE — Progress Notes (Signed)
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 Otelia Sergeant, ortho. Improvement with antibiotics. Currently on Vancomycin and Ceftriaxone. Dr. Ninetta Lights, ID, onboard. Discussed with interventional radiology, no amendable to a tap. Immobilization of the C-spine with soft cervical collar while seated or up with activity. PICC line. Will need long term antibiotics at least 4-6 weeks.  Polysubstance abuse  Patient interested in cessation. Social work consulted, patient was counseled against substance abuse.   Nicotine abuse  Patient was strongly counseled against polysubstance abuse and nicotine abuse. He declined a nicotine patches.   Hypokalemia Replace as needed.  Prophylaxis Heparin subcutaneous.  Code Status: Full code. Family Communication: No family at bedside. Patient updated. Disposition Plan: Pending. DC to SNF today.    Consultants:  Dr. Otelia Sergeant, Ortho  Dr. Ninetta Lights, ID  Procedures:  None  Antibiotics:  Cefepime 06/23/2012 >> 06/25/2012  Vancomycin 06/23/2012 >>  Ceftriaxone 06/25/2012 >>  HPI/Subjective: No specific concerns.  Objective: Filed Vitals:   06/25/12 0529 06/25/12 1340 06/25/12 2158 06/26/12 0627  BP: 125/82 118/78 112/79 110/65  Pulse: 73 94 73 74  Temp: 98 F (36.7 C) 98.1 F (36.7 C) 97.7 F (36.5 C) 97.4 F (36.3 C)  TempSrc: Oral Oral Oral Oral  Resp: 20 17 20 20   Height:      Weight:      SpO2: 98% 100% 98% 97%   No intake or output data in the 24 hours ending 06/26/12 1150 Filed Weights   06/23/12 1356 06/23/12 2145  Weight: 72.576 kg (160 lb) 66 kg (145 lb 8.1 oz)    Exam: Physical Exam: General: Awake, Oriented, No acute distress. HEENT: EOMI. Neck: Supple CV: S1 and S2 Lungs: Clear to ascultation  bilaterally Abdomen: Soft, Nontender, Nondistended, +bowel sounds. Ext: Good pulses. Trace edema.  Data Reviewed: Basic Metabolic Panel:  Lab 06/25/12 8119 06/24/12 0628 06/23/12 1311  NA 138 136 138  K 4.5 3.3* 4.0  CL 99 99 100  CO2 27 25 26   GLUCOSE 111* 146* 98  BUN 9 9 13   CREATININE 0.77 0.64 0.75  CALCIUM 9.9 9.4 9.5  MG 2.1 -- --  PHOS -- -- --   Liver Function Tests: No results found for this basename: AST:5,ALT:5,ALKPHOS:5,BILITOT:5,PROT:5,ALBUMIN:5 in the last 168 hours No results found for this basename: LIPASE:5,AMYLASE:5 in the last 168 hours No results found for this basename: AMMONIA:5 in the last 168 hours CBC:  Lab 06/25/12 0715 06/24/12 0628 06/23/12 1311  WBC 7.8 8.3 10.1  NEUTROABS -- -- 7.2  HGB 15.6 14.6 16.7  HCT 43.5 41.3 46.1  MCV 86.7 86.6 87.0  PLT 244 260 253   Cardiac Enzymes: No results found for this basename: CKTOTAL:5,CKMB:5,CKMBINDEX:5,TROPONINI:5 in the last 168 hours BNP (last 3 results) No results found for this basename: PROBNP:3 in the last 8760 hours CBG: No results found for this basename: GLUCAP:5 in the last 168 hours  Recent Results (from the past 240 hour(s))  CULTURE, BLOOD (ROUTINE X 2)     Status: Normal (Preliminary result)   Collection Time   06/23/12  1:15 PM      Component Value Range Status Comment   Specimen Description BLOOD RIGHT ARM   Final    Special Requests BOTTLES DRAWN AEROBIC AND ANAEROBIC 5CC   Final    Culture  Setup Time 06/23/2012 21:08  Final    Culture     Final    Value:        BLOOD CULTURE RECEIVED NO GROWTH TO DATE CULTURE WILL BE HELD FOR 5 DAYS BEFORE ISSUING A FINAL NEGATIVE REPORT   Report Status PENDING   Incomplete   CULTURE, BLOOD (ROUTINE X 2)     Status: Normal (Preliminary result)   Collection Time   06/23/12  1:15 PM      Component Value Range Status Comment   Specimen Description BLOOD LEFT ARM   Final    Special Requests BOTTLES DRAWN AEROBIC ONLY 10CC   Final    Culture   Setup Time 06/23/2012 21:09   Final    Culture     Final    Value:        BLOOD CULTURE RECEIVED NO GROWTH TO DATE CULTURE WILL BE HELD FOR 5 DAYS BEFORE ISSUING A FINAL NEGATIVE REPORT   Report Status PENDING   Incomplete   URINE CULTURE     Status: Normal   Collection Time   06/23/12 11:09 PM      Component Value Range Status Comment   Specimen Description URINE, RANDOM   Final    Special Requests NONE   Final    Culture  Setup Time 06/23/2012 23:42   Final    Colony Count NO GROWTH   Final    Culture NO GROWTH   Final    Report Status 06/25/2012 FINAL   Final      Studies: No results found.  Scheduled Meds:    . cefTRIAXone (ROCEPHIN)  IV  2 g Intravenous Q24H  . heparin  5,000 Units Subcutaneous Q8H  . methocarbamol (ROBAXIN) IV  500 mg Intravenous Q6H  . sodium chloride  3 mL Intravenous Q12H  . vancomycin  1,250 mg Intravenous Q8H   Continuous Infusions:    . sodium chloride 999 mL (06/24/12 0981)    Principal Problem:  *Abscess in epidural space of cervical spine Active Problems:  Cervical discitis  Polysubstance abuse  Nicotine abuse  Intractable neck pain with left UE weakness    Mansur Patti A, MD  [name] Pager [phone]. If 7PM-7AM, please contact night-coverage at [email or web address], password [facility] 06/26/2012, 11:50 AM  LOS: 3 days

## 2012-06-26 NOTE — Discharge Summary (Addendum)
Physician Discharge Summary  Christopher Berg VWU:[id number] DOB: 02-12-1981 DOA: 06/23/2012  PCP: Default, Provider, MD  Admit date: 06/23/2012 Discharge date: 06/30/2012  Time spent: 25 minutes  Recommendations for Outpatient Follow-up:  Please followup in ID clinic, Dr. Ninetta Lights in 2-3 weeks.  Please followup with Dr. Otelia Sergeant, ortho in 2-3 weeks.  Hep C genotype pending  Discharge Diagnoses:  Principal Problem:  *Abscess in epidural space of cervical spine Active Problems:  Cervical discitis  Polysubstance abuse  Nicotine abuse  Intractable neck pain with left UE weakness   Discharge Condition: Stable  Diet recommendation: General diet  Filed Weights   06/23/12 1356 06/23/12 2145  Weight: 72.576 kg (160 lb) 66 kg (145 lb 8.1 oz)    History of present illness:  On admission: "Patient is a 32 year old male with IV drug abuse history, polysubstance abuse has been complaining of intractable back pain for the last 2 weeks."  Hospital Course:  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 Otelia Sergeant, ortho. Improvement with antibiotics. Currently on Vancomycin and Ceftriaxone. Dr. Ninetta Lights, ID, onboard. Discussed with interventional radiology, no amendable to a tap. Immobilization of the C-spine with soft cervical collar while seated or up with activity. PICC line. Will need long term antibiotics at least 4-6 weeks, ID, Dr. Ninetta Lights to decide on end date for antibiotics. Start oxycodone 5-10 mg q4h PRN pain, start MS contin 15 mg BID for long acting pain control, discontinued vicodine.  Polysubstance abuse  Patient interested in cessation. Social work consulted, patient was counseled against substance abuse.   Nicotine abuse  Patient was strongly counseled against polysubstance abuse and nicotine abuse. He declined a nicotine patches.   Hypokalemia Replace as needed.  Hep C Hep C genotype  pending. Hep C RNA positive. Further management as outpatient. Patient to start Hep A and B vaccines on 06/26/2012.  Consultants:  Dr. Otelia Sergeant, Ortho  Dr. Ninetta Lights, ID  Procedures:  None  Antibiotics:  Cefepime 06/23/2012 >> 06/25/2012  Vancomycin 06/23/2012 >>  Ceftriaxone 06/25/2012 >>  Discharge Exam: Filed Vitals:   06/28/12 2149 06/29/12 1418 06/29/12 2102 06/30/12 0627  BP: 108/67 118/72 116/79 108/63  Pulse: 75 73 78 63  Temp:  97.8 F (36.6 C) 98 F (36.7 C) 97.7 F (36.5 C)  TempSrc: Oral  Oral Oral  Resp: 17 16 18 18   Height:      Weight:      SpO2: 97% 97% 97% 99%    Discharge Instructions      Discharge Orders    Future Appointments: Provider: Department: Dept Phone: Center:   07/13/2012 10:45 AM Ginnie Smart, MD [facility] [phone] [facility]     Future Orders Please Complete By Expires   Diet general      Increase activity slowly      Discharge instructions      Comments:   Please followup in ID clinic, Dr. Ninetta Lights in 2-3 weeks.  Please followup with Dr. Otelia Sergeant, ortho in 2-3 weeks.       Medication List     As of 06/30/2012  9:28 AM    STOP taking these medications         HYDROcodone-acetaminophen 10-325 MG per tablet   Commonly known as: NORCO      TAKE these medications         acetaminophen 325 MG tablet   Commonly known as: TYLENOL  Take 2 tablets (650 mg total) by mouth every 6 (six) hours as needed (or Fever >/= 101).      alum & mag hydroxide-simeth 200-200-20 MG/5ML suspension   Commonly known as: MAALOX/MYLANTA   Take 30 mLs by mouth every 6 (six) hours as needed (dyspepsia).      cyclobenzaprine 10 MG tablet   Commonly known as: FLEXERIL   Take 10 mg by mouth every 8 (eight) hours as needed. For muscle spasms      dextrose 5 % SOLN 50 mL with cefTRIAXone 2 G SOLR 2 g   Inject 2 g into the vein daily. For atleast 4 weeks from 06/23/2012, end date to be determined by ID.       ibuprofen 600 MG tablet   Commonly known as: ADVIL,MOTRIN   Take 600 mg by mouth every 6 (six) hours as needed. For pain.      morphine 15 MG 12 hr tablet   Commonly known as: MS CONTIN   Take 1 tablet (15 mg total) by mouth every 12 (twelve) hours.      oxyCODONE 5 MG immediate release tablet   Commonly known as: Oxy IR/ROXICODONE   Take 1-2 tablets (5-10 mg total) by mouth every 4 (four) hours as needed for pain.      sodium chloride 0.9 % SOLN 250 mL with vancomycin 10 G SOLR 1,250 mg   Inject 1,250 mg into the vein every 8 (eight) hours. For atleast 4 weeks from 06/23/2012, ID to decide end date as outpatient. Dose to be adjusted as per pharmacy.         Follow-up Information    Follow up with [name], MD. Schedule an appointment as soon as possible for a visit in 2 weeks.   Contact information:   [address]  [address] [place] [address] [phone]       Follow up with [name],[name], MD. Schedule an appointment as soon as possible for a visit in 2 weeks.   Contact information:   [address] [place] [address] [phone]           The results of significant diagnostics from this hospitalization (including imaging, microbiology, ancillary and laboratory) are listed below for reference.    Significant Diagnostic Studies: Ct Cervical Spine Wo Contrast  06/13/2012  *RADIOLOGY REPORT*  Clinical Data: Chronic neck pain.  CT CERVICAL SPINE WITHOUT CONTRAST  Technique:  Multidetector CT imaging of the cervical spine was performed. Multiplanar CT image reconstructions were also generated.  Comparison: MRI 04/12/2009  Findings: Normal alignment.  Mild degenerative disc disease changes diffusely with disc space narrowing and spurring, most pronounced at C5-6 and C6-7.  The previously described left paracentral disc protrusion by MRI cannot be appreciated on CT and could be further evaluated with elective MRI if felt clinically  indicated.  No fracture.  No epidural or paraspinal hematoma.  IMPRESSION: Degenerative changes.  No acute findings.   Original Report Authenticated By: Charlett Nose, M.D.    Mr Cervical Spine Wo Contrast  06/23/2012  *RADIOLOGY REPORT*  Clinical Data: Severe neck pain and stiffness.  Bilateral shoulder pain and upper extremity weakness since a lifting injury 2 weeks ago.  MRI CERVICAL SPINE WITHOUT CONTRAST  Technique:  Multiplanar and multiecho pulse sequences of the cervical spine, to include the craniocervical junction and cervicothoracic junction, were obtained according to standard protocol without intravenous contrast.  Comparison: CT scan dated 06/13/2012 and cervical MRI  dated 04/12/2009  Findings: The patient has diskitis at C5-6 with an anterior epidural abscess and a prevertebral soft tissue abscess.  The posterior longitudinal ligament is elevated from the C4-5 level to the C6-7 level.  Prevertebral edema extends from C2-3 to C6-7.  There is narrowing of the C5-6 disc space with edema in the C5 and C6 vertebra.  There is a small erosion of the posterior-superior aspect of C6 and there is a 2.5 mm retrolisthesis of C5 on C6. The thecal sac is compressed by the abscess and there is slight compression of the spinal cord without myelopathy.  C1-2 through C4-5:  The discs are normal.  C6-7:  Small disc protrusion with accompanying osteophytes centrally and asymmetric into the left lateral recess, decreased in size since the prior MRI. 2 mm retrolisthesis of C6 on C7.  C7-T1 through T2-3:  Normal.  IMPRESSION: 1.  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 2.  Thecal sac and spinal cord are slightly compressed at C5-6 without myelopathy. 3.  2.5 mm retrolisthesis of C5 on C6. 3.  Edema in the C5 and C6 vertebra.  This could represent reactive edema or osteomyelitis.  There is a small focal area of erosion of the posterior-superior aspect  of C6 in the midline but the  margin is sclerotic suggesting this is chronic.           Emergent results were called by phone at the time of interpretation on 06/23/2012 at 9:25 a.m. to Dr. Otelia Sergeant,, who verbally acknowledged the results.   Original Report Authenticated By: Francene Boyers, M.D.     Microbiology: Recent Results (from the past 240 hour(s))  CULTURE, BLOOD (ROUTINE X 2)     Status: Normal   Collection Time   06/23/12  1:15 PM      Component Value Range Status Comment   Specimen Description BLOOD RIGHT ARM   Final    Special Requests BOTTLES DRAWN AEROBIC AND ANAEROBIC 5CC   Final    Culture  Setup Time 06/23/2012 21:08   Final    Culture NO GROWTH 5 DAYS   Final    Report Status 06/29/2012 FINAL   Final   CULTURE, BLOOD (ROUTINE X 2)     Status: Normal   Collection Time   06/23/12  1:15 PM      Component Value Range Status Comment   Specimen Description BLOOD LEFT ARM   Final    Special Requests BOTTLES DRAWN AEROBIC ONLY 10CC   Final    Culture  Setup Time 06/23/2012 21:09   Final    Culture NO GROWTH 5 DAYS   Final    Report Status 06/29/2012 FINAL   Final   URINE CULTURE     Status: Normal   Collection Time   06/23/12 11:09 PM      Component Value Range Status Comment   Specimen Description URINE, RANDOM   Final    Special Requests NONE   Final    Culture  Setup Time 06/23/2012 23:42   Final    Colony Count NO GROWTH   Final    Culture NO GROWTH   Final    Report Status 06/25/2012 FINAL   Final      Labs: Basic Metabolic Panel:  Lab 06/29/12 0981 06/25/12 0715 06/24/12 0628 06/23/12 1311  NA -- 138 136 138  K -- 4.5 3.3* 4.0  CL -- 99 99 100  CO2 -- 27 25 26   GLUCOSE -- 111*  146* 98  BUN -- 9 9 13   CREATININE 0.63 0.77 0.64 0.75  CALCIUM -- 9.9 9.4 9.5  MG -- 2.1 -- --  PHOS -- -- -- --   Liver Function Tests: No results found for this basename: AST:5,ALT:5,ALKPHOS:5,BILITOT:5,PROT:5,ALBUMIN:5 in the last 168 hours No results found for this basename: LIPASE:5,AMYLASE:5 in the last  168 hours No results found for this basename: AMMONIA:5 in the last 168 hours CBC:  Lab 06/25/12 0715 06/24/12 0628 06/23/12 1311  WBC 7.8 8.3 10.1  NEUTROABS -- -- 7.2  HGB 15.6 14.6 16.7  HCT 43.5 41.3 46.1  MCV 86.7 86.6 87.0  PLT 244 260 253   Cardiac Enzymes: No results found for this basename: CKTOTAL:5,CKMB:5,CKMBINDEX:5,TROPONINI:5 in the last 168 hours BNP: BNP (last 3 results) No results found for this basename: PROBNP:3 in the last 8760 hours CBG: No results found for this basename: GLUCAP:5 in the last 168 hours     Signed:  Eulalah Rupert A  [name] 06/30/2012, 9:28 AM

## 2012-06-26 NOTE — Progress Notes (Signed)
INFECTIOUS DISEASE PROGRESS NOTE  ID: Christopher Berg is a 32 y.o. male with   Principal Problem:  *Abscess in epidural space of cervical spine Active Problems:  Cervical discitis  Polysubstance abuse  Nicotine abuse  Intractable neck pain with left UE weakness  Subjective: Feels better, less painful to swallow  Abtx:  Anti-infectives     Start     Dose/Rate Route Frequency Ordered Stop   06/26/12 0000   dextrose 5 % SOLN 50 mL with cefTRIAXone 2 G SOLR 2 g        2 g 100 mL/hr over 30 Minutes Intravenous Every 24 hours 06/26/12 1200     06/26/12 0000   sodium chloride 0.9 % SOLN 250 mL with vancomycin 10 G SOLR 1,250 mg        1,250 mg 166.7 mL/hr over 90 Minutes Intravenous Every 8 hours 06/26/12 1200     06/25/12 1200   cefTRIAXone (ROCEPHIN) 2 g in dextrose 5 % 50 mL IVPB        2 g 100 mL/hr over 30 Minutes Intravenous Every 24 hours 06/25/12 1102     06/24/12 2200   ceFEPIme (MAXIPIME) 2 g in dextrose 5 % 50 mL IVPB  Status:  Discontinued        2 g 100 mL/hr over 30 Minutes Intravenous Every 12 hours 06/24/12 1027 06/25/12 1102   06/24/12 2100   vancomycin (VANCOCIN) 1,250 mg in sodium chloride 0.9 % 250 mL IVPB        1,250 mg 166.7 mL/hr over 90 Minutes Intravenous Every 8 hours 06/24/12 1426     06/23/12 2200   vancomycin (VANCOCIN) IVPB 1000 mg/200 mL premix  Status:  Discontinued        1,000 mg 200 mL/hr over 60 Minutes Intravenous Every 8 hours 06/23/12 1933 06/24/12 1425   06/23/12 1400   ceFEPIme (MAXIPIME) 1 g in dextrose 5 % 50 mL IVPB  Status:  Discontinued        1 g 100 mL/hr over 30 Minutes Intravenous Every 12 hours 06/23/12 1238 06/24/12 1027   06/23/12 1245   vancomycin (VANCOCIN) IVPB 1000 mg/200 mL premix        1,000 mg 200 mL/hr over 60 Minutes Intravenous  Once 06/23/12 1237 06/23/12 1851   06/23/12 1245   metroNIDAZOLE (FLAGYL) IVPB 500 mg        500 mg 100 mL/hr over 60 Minutes Intravenous  Once 06/23/12 1238 06/23/12 1505          Medications:  Scheduled:   . cefTRIAXone (ROCEPHIN)  IV  2 g Intravenous Q24H  . heparin  5,000 Units Subcutaneous Q8H  . methocarbamol (ROBAXIN) IV  500 mg Intravenous Q6H  . sodium chloride  3 mL Intravenous Q12H  . vancomycin  1,250 mg Intravenous Q8H    Objective: Vital signs in last 24 hours: Temp:  [97.4 F (36.3 C)-98.1 F (36.7 C)] 97.4 F (36.3 C) (01/24 0627) Pulse Rate:  [73-94] 74  (01/24 0627) Resp:  [17-20] 20  (01/24 0627) BP: (110-118)/(65-79) 110/65 mmHg (01/24 0627) SpO2:  [97 %-100 %] 97 % (01/24 0627)   General appearance: alert, cooperative and no distress Neck: no adenopathy, supple, symmetrical, trachea midline and non-tender Resp: clear to auscultation bilaterally Cardio: regular rate and rhythm GI: normal findings: bowel sounds normal and soft, non-tender  Lab Results  Basename 06/25/12 0715 06/24/12 0628  WBC 7.8 8.3  HGB 15.6 14.6  HCT 43.5 41.3  NA 138  136  K 4.5 3.3*  CL 99 99  CO2 27 25  BUN 9 9  CREATININE 0.77 0.64  GLU -- --   Liver Panel No results found for this basename: PROT:2,ALBUMIN:2,AST:2,ALT:2,ALKPHOS:2,BILITOT:2,BILIDIR:2,IBILI:2 in the last 72 hours Sedimentation Rate  Basename 06/24/12 2038  ESRSEDRATE 39*   C-Reactive Protein  Basename 06/25/12 0715  CRP 1.4*    Microbiology: Recent Results (from the past 240 hour(s))  CULTURE, BLOOD (ROUTINE X 2)     Status: Normal (Preliminary result)   Collection Time   06/23/12  1:15 PM      Component Value Range Status Comment   Specimen Description BLOOD RIGHT ARM   Final    Special Requests BOTTLES DRAWN AEROBIC AND ANAEROBIC 5CC   Final    Culture  Setup Time 06/23/2012 21:08   Final    Culture     Final    Value:        BLOOD CULTURE RECEIVED NO GROWTH TO DATE CULTURE WILL BE HELD FOR 5 DAYS BEFORE ISSUING A FINAL NEGATIVE REPORT   Report Status PENDING   Incomplete   CULTURE, BLOOD (ROUTINE X 2)     Status: Normal (Preliminary result)   Collection Time    06/23/12  1:15 PM      Component Value Range Status Comment   Specimen Description BLOOD LEFT ARM   Final    Special Requests BOTTLES DRAWN AEROBIC ONLY 10CC   Final    Culture  Setup Time 06/23/2012 21:09   Final    Culture     Final    Value:        BLOOD CULTURE RECEIVED NO GROWTH TO DATE CULTURE WILL BE HELD FOR 5 DAYS BEFORE ISSUING A FINAL NEGATIVE REPORT   Report Status PENDING   Incomplete   URINE CULTURE     Status: Normal   Collection Time   06/23/12 11:09 PM      Component Value Range Status Comment   Specimen Description URINE, RANDOM   Final    Special Requests NONE   Final    Culture  Setup Time 06/23/2012 23:42   Final    Colony Count NO GROWTH   Final    Culture NO GROWTH   Final    Report Status 06/25/2012 FINAL   Final     Studies/Results: No results found.   Assessment/Plan: Cervical Epidural Abscess  Substance abuse  Hep C  Total days of antibiotics: 4 (vanco/ceftriaxone)  BCx ngtd Will continue his anbx, plan for 6 weeks. Unfortunately not able to get fluid sample.  F/u ID clinic 2-3 weeks.  Start Hep A and B vaccines, check Hep C DNA and genotype   Christopher Berg [facility] [phone] 06/26/2012, 12:23 PM   LOS: 3 days

## 2012-06-27 NOTE — Progress Notes (Signed)
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 Otelia Sergeant, ortho. Improvement with antibiotics. Currently on Vancomycin and Ceftriaxone. Dr. Ninetta Lights, ID, onboard. Discussed with interventional radiology, no amendable to a tap. Immobilization of the C-spine with soft cervical collar while seated or up with activity. PICC line. Will need long term antibiotics at least 4-6 weeks.  Polysubstance abuse  Patient interested in cessation. Social work consulted, patient was counseled against substance abuse.   Nicotine abuse  Patient was strongly counseled against polysubstance abuse and nicotine abuse. He declined a nicotine patches.   Hypokalemia Replace as needed.  Prophylaxis Heparin subcutaneous.  Code Status: Full code. Family Communication: No family at bedside. Patient updated. Disposition Plan: Pending. DC to SNF when bed is available.    Consultants:  Dr. Otelia Sergeant, Ortho  Dr. Ninetta Lights, ID  Procedures:  None  Antibiotics:  Cefepime 06/23/2012 >> 06/25/2012  Vancomycin 06/23/2012 >>  Ceftriaxone 06/25/2012 >>  HPI/Subjective: No specific concerns. Waiting for SNF.  Objective: Filed Vitals:   06/26/12 0627 06/26/12 1438 06/26/12 2115 06/27/12 0551  BP: 110/65 115/77 131/88 116/69  Pulse: 74 80 88 71  Temp: 97.4 F (36.3 C) 98 F (36.7 C) 98.1 F (36.7 C) 98 F (36.7 C)  TempSrc: Oral  Oral Oral  Resp: 20 20 20 20   Height:      Weight:      SpO2: 97% 98% 99% 98%    Intake/Output Summary (Last 24 hours) at 06/27/12 1003 Last data filed at 06/26/12 1752  Gross per 24 hour  Intake   1200 ml  Output    250 ml  Net    950 ml   Filed Weights   06/23/12 1356 06/23/12 2145  Weight: 72.576 kg (160 lb) 66 kg (145 lb 8.1 oz)     Exam: Physical Exam: General: Awake, Oriented, No acute distress. HEENT: EOMI. Neck: Supple CV: S1 and S2 Lungs: Clear to ascultation bilaterally Abdomen: Soft, Nontender, Nondistended, +bowel sounds. Ext: Good pulses. Trace edema.  Data Reviewed: Basic Metabolic Panel:  Lab 06/25/12 7846 06/24/12 0628 06/23/12 1311  NA 138 136 138  K 4.5 3.3* 4.0  CL 99 99 100  CO2 27 25 26   GLUCOSE 111* 146* 98  BUN 9 9 13   CREATININE 0.77 0.64 0.75  CALCIUM 9.9 9.4 9.5  MG 2.1 -- --  PHOS -- -- --   Liver Function Tests: No results found for this basename: AST:5,ALT:5,ALKPHOS:5,BILITOT:5,PROT:5,ALBUMIN:5 in the last 168 hours No results found for this basename: LIPASE:5,AMYLASE:5 in the last 168 hours No results found for this basename: AMMONIA:5 in the last 168 hours CBC:  Lab 06/25/12 0715 06/24/12 0628 06/23/12 1311  WBC 7.8 8.3 10.1  NEUTROABS -- -- 7.2  HGB 15.6 14.6 16.7  HCT 43.5 41.3 46.1  MCV 86.7 86.6 87.0  PLT 244 260 253   Cardiac Enzymes: No results found for this basename: CKTOTAL:5,CKMB:5,CKMBINDEX:5,TROPONINI:5 in the last 168 hours BNP (last 3 results) No results found for this basename: PROBNP:3 in the last 8760 hours CBG: No results found for this basename: GLUCAP:5 in the last 168 hours  Recent Results (from the past 240 hour(s))  CULTURE, BLOOD (ROUTINE X 2)     Status: Normal (Preliminary result)   Collection Time   06/23/12  1:15 PM      Component Value  Range Status Comment   Specimen Description BLOOD RIGHT ARM   Final    Special Requests BOTTLES DRAWN AEROBIC AND ANAEROBIC 5CC   Final    Culture  Setup Time 06/23/2012 21:08   Final    Culture     Final    Value:        BLOOD CULTURE RECEIVED NO GROWTH TO DATE CULTURE WILL BE HELD FOR 5 DAYS BEFORE ISSUING A FINAL NEGATIVE REPORT   Report Status PENDING   Incomplete   CULTURE, BLOOD (ROUTINE X 2)     Status: Normal (Preliminary result)   Collection Time   06/23/12  1:15 PM      Component Value  Range Status Comment   Specimen Description BLOOD LEFT ARM   Final    Special Requests BOTTLES DRAWN AEROBIC ONLY 10CC   Final    Culture  Setup Time 06/23/2012 21:09   Final    Culture     Final    Value:        BLOOD CULTURE RECEIVED NO GROWTH TO DATE CULTURE WILL BE HELD FOR 5 DAYS BEFORE ISSUING A FINAL NEGATIVE REPORT   Report Status PENDING   Incomplete   URINE CULTURE     Status: Normal   Collection Time   06/23/12 11:09 PM      Component Value Range Status Comment   Specimen Description URINE, RANDOM   Final    Special Requests NONE   Final    Culture  Setup Time 06/23/2012 23:42   Final    Colony Count NO GROWTH   Final    Culture NO GROWTH   Final    Report Status 06/25/2012 FINAL   Final      Studies: No results found.  Scheduled Meds:    . cefTRIAXone (ROCEPHIN)  IV  2 g Intravenous Q24H  . heparin  5,000 Units Subcutaneous Q8H  . hepatitis A virus (PF) vaccine  1 mL Intramuscular Once  . methocarbamol (ROBAXIN) IV  500 mg Intravenous Q6H  . sodium chloride  3 mL Intravenous Q12H  . vancomycin  1,250 mg Intravenous Q8H   Continuous Infusions:    . sodium chloride 999 mL (06/24/12 5366)    Principal Problem:  *Abscess in epidural space of cervical spine Active Problems:  Cervical discitis  Polysubstance abuse  Nicotine abuse  Intractable neck pain with left UE weakness    Christopher Berg A, MD  [name] Pager [phone]. If 7PM-7AM, please contact night-coverage at [email or web address], password [facility] 06/27/2012, 10:03 AM  LOS: 4 days

## 2012-06-28 NOTE — Progress Notes (Signed)
Subjective:     Patient reports pain as moderate.   He was dosing when I came in but once awake states his pain is bad.  Hx of Rx drug abuse, Opana, Oxycontin, Percocet no prescribed for him  etc. IV drug abuse Hx.  Objective: Vital signs in last 24 hours: Temp:  [97.9 F (36.6 C)-98.3 F (36.8 C)] 98.1 F (36.7 C) (01/26 0600) Pulse Rate:  [67-91] 67  (01/26 0600) Resp:  [16-20] 16  (01/26 0600) BP: (108-124)/(67-79) 109/71 mmHg (01/26 0600) SpO2:  [98 %-100 %] 98 % (01/26 0600)  Intake/Output from previous day: 01/25 0701 - 01/26 0700 In: 480 [P.O.:480] Out: -  Intake/Output this shift:    No results found for this basename: HGB:5 in the last 72 hours No results found for this basename: WBC:2,RBC:2,HCT:2,PLT:2 in the last 72 hours No results found for this basename: NA:2,K:2,CL:2,CO2:2,BUN:2,CREATININE:2,GLUCOSE:2,CALCIUM:2 in the last 72 hours No results found for this basename: LABPT:2,INR:2 in the last 72 hours    Up walking in halls  Assessment/Plan:     Up with therapy  Has Suboxone that he has at home.   He states "  i don't like Methadone"  Lenox Ladouceur C 06/28/2012, 11:14 AM

## 2012-06-28 NOTE — Progress Notes (Signed)
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 Otelia Sergeant, ortho. Improvement with antibiotics. Currently on Vancomycin and Ceftriaxone. Dr. Ninetta Lights, ID, onboard. Discussed with interventional radiology, no amendable to a tap. Immobilization of the C-spine with soft cervical collar while seated or up with activity. PICC line. Will need long term antibiotics at least 4-6 weeks.  Polysubstance abuse  Patient interested in cessation. Social work consulted, patient was counseled against substance abuse.   Nicotine abuse  Patient was strongly counseled against polysubstance abuse and nicotine abuse. He declined a nicotine patches.   Hypokalemia Resolved with replacement.  Hep C  Hep C genotype and RNA pending. Further management as outpatient. Patient to start Hep A and B vaccines today prior to discharge.  Prophylaxis Heparin subcutaneous.  Code Status: Full code. Family Communication: No family at bedside. Patient updated. Disposition Plan: Pending. DC to SNF when bed is available.    Consultants:  Dr. Otelia Sergeant, Ortho  Dr. Ninetta Lights, ID  Procedures:  None  Antibiotics:  Cefepime 06/23/2012 >> 06/25/2012  Vancomycin 06/23/2012 >>  Ceftriaxone 06/25/2012 >>  HPI/Subjective: No specific concerns, except neck pain.  Objective: Filed Vitals:   06/27/12 1332 06/27/12 2133 06/28/12 0200 06/28/12 0600  BP: 118/79 124/76 108/67 109/71  Pulse: 91 85 81 67  Temp: 97.9 F (36.6 C) 98.3 F (36.8 C) 97.9 F (36.6 C) 98.1 F (36.7 C)  TempSrc:      Resp: 20 16 16 16   Height:      Weight:      SpO2: 100% 98% 99% 98%    Intake/Output Summary (Last 24 hours) at 06/28/12 0901 Last data filed at 06/27/12 1712  Gross per 24 hour  Intake    480 ml  Output       0 ml  Net    480 ml   Filed Weights   06/23/12 1356 06/23/12 2145  Weight: 72.576 kg (160 lb) 66 kg (145 lb 8.1 oz)    Exam: Physical Exam: General: Awake, Oriented, No acute distress. HEENT: EOMI. Neck: Supple CV: S1 and S2 Lungs: Clear to ascultation bilaterally Abdomen: Soft, Nontender, Nondistended, +bowel sounds. Ext: Good pulses. Trace edema.  Data Reviewed: Basic Metabolic Panel:  Lab 06/25/12 8119 06/24/12 0628 06/23/12 1311  NA 138 136 138  K 4.5 3.3* 4.0  CL 99 99 100  CO2 27 25 26   GLUCOSE 111* 146* 98  BUN 9 9 13   CREATININE 0.77 0.64 0.75  CALCIUM 9.9 9.4 9.5  MG 2.1 -- --  PHOS -- -- --   Liver Function Tests: No results found for this basename: AST:5,ALT:5,ALKPHOS:5,BILITOT:5,PROT:5,ALBUMIN:5 in the last 168 hours No results found for this basename: LIPASE:5,AMYLASE:5 in the last 168 hours No results found for this basename: AMMONIA:5 in the last 168 hours CBC:  Lab 06/25/12 0715 06/24/12 0628 06/23/12 1311  WBC 7.8 8.3 10.1  NEUTROABS -- -- 7.2  HGB 15.6 14.6 16.7  HCT 43.5 41.3 46.1  MCV 86.7 86.6 87.0  PLT 244 260 253   Cardiac Enzymes: No results found for this basename: CKTOTAL:5,CKMB:5,CKMBINDEX:5,TROPONINI:5 in the last 168 hours BNP (last 3 results) No results found for this basename: PROBNP:3 in the last 8760 hours CBG: No results found for this basename: GLUCAP:5 in the last 168 hours  Recent Results (from the past 240 hour(s))  CULTURE,  BLOOD (ROUTINE X 2)     Status: Normal (Preliminary result)   Collection Time   06/23/12  1:15 PM      Component Value Range Status Comment   Specimen Description BLOOD RIGHT ARM   Final    Special Requests BOTTLES DRAWN AEROBIC AND ANAEROBIC 5CC   Final    Culture  Setup Time 06/23/2012 21:08   Final    Culture     Final    Value:        BLOOD CULTURE RECEIVED NO GROWTH TO DATE CULTURE WILL BE HELD FOR 5 DAYS BEFORE ISSUING A FINAL NEGATIVE REPORT   Report Status PENDING   Incomplete     CULTURE, BLOOD (ROUTINE X 2)     Status: Normal (Preliminary result)   Collection Time   06/23/12  1:15 PM      Component Value Range Status Comment   Specimen Description BLOOD LEFT ARM   Final    Special Requests BOTTLES DRAWN AEROBIC ONLY 10CC   Final    Culture  Setup Time 06/23/2012 21:09   Final    Culture     Final    Value:        BLOOD CULTURE RECEIVED NO GROWTH TO DATE CULTURE WILL BE HELD FOR 5 DAYS BEFORE ISSUING A FINAL NEGATIVE REPORT   Report Status PENDING   Incomplete   URINE CULTURE     Status: Normal   Collection Time   06/23/12 11:09 PM      Component Value Range Status Comment   Specimen Description URINE, RANDOM   Final    Special Requests NONE   Final    Culture  Setup Time 06/23/2012 23:42   Final    Colony Count NO GROWTH   Final    Culture NO GROWTH   Final    Report Status 06/25/2012 FINAL   Final      Studies: No results found.  Scheduled Meds:    . cefTRIAXone (ROCEPHIN)  IV  2 g Intravenous Q24H  . heparin  5,000 Units Subcutaneous Q8H  . hepatitis A virus (PF) vaccine  1 mL Intramuscular Once  . methocarbamol (ROBAXIN) IV  500 mg Intravenous Q6H  . sodium chloride  3 mL Intravenous Q12H  . vancomycin  1,250 mg Intravenous Q8H   Continuous Infusions:    . sodium chloride 999 mL (06/24/12 1610)    Principal Problem:  *Abscess in epidural space of cervical spine Active Problems:  Cervical discitis  Polysubstance abuse  Nicotine abuse  Intractable neck pain with left UE weakness    Christopher Berg A, MD  [name] Pager [phone]. If 7PM-7AM, please contact night-coverage at [email or web address], password [facility] 06/28/2012, 9:01 AM  LOS: 5 days

## 2012-06-28 NOTE — Progress Notes (Signed)
ANTIBIOTIC CONSULT NOTE - FOLLOW UP  Pharmacy Consult for Vancomycin Indication: cervical epidural abscess  No Known Allergies  Patient Measurements: Height: 5\' 11"  (180.3 cm) Weight: 145 lb 8.1 oz (66 kg) IBW/kg (Calculated) : 75.3   Vital Signs: Temp: 97.7 F (36.5 C) (01/26 1400) Temp src: Oral (01/26 1400) BP: 117/65 mmHg (01/26 1400) Pulse Rate: 72  (01/26 1400) Intake/Output from previous day: 01/25 0701 - 01/26 0700 In: 480 [P.O.:480] Out: -  Intake/Output from this shift:    Labs: No results found for this basename: WBC:3,HGB:3,PLT:3,LABCREA:3,CREATININE:3, in the last 72 hours Estimated Creatinine Clearance: 126 ml/min (by C-G formula based on Cr of 0.77).  Basename 06/25/12 2030  VANCOTROUGH 14.3  VANCOPEAK --  VANCORANDOM --  GENTTROUGH --  GENTPEAK --  GENTRANDOM --  TOBRATROUGH --  TOBRAPEAK --  TOBRARND --  AMIKACINPEAK --  AMIKACINTROU --  AMIKACIN --     Microbiology: Recent Results (from the past 720 hour(s))  CULTURE, BLOOD (ROUTINE X 2)     Status: Normal (Preliminary result)   Collection Time   06/23/12  1:15 PM      Component Value Range Status Comment   Specimen Description BLOOD RIGHT ARM   Final    Special Requests BOTTLES DRAWN AEROBIC AND ANAEROBIC 5CC   Final    Culture  Setup Time 06/23/2012 21:08   Final    Culture     Final    Value:        BLOOD CULTURE RECEIVED NO GROWTH TO DATE CULTURE WILL BE HELD FOR 5 DAYS BEFORE ISSUING A FINAL NEGATIVE REPORT   Report Status PENDING   Incomplete   CULTURE, BLOOD (ROUTINE X 2)     Status: Normal (Preliminary result)   Collection Time   06/23/12  1:15 PM      Component Value Range Status Comment   Specimen Description BLOOD LEFT ARM   Final    Special Requests BOTTLES DRAWN AEROBIC ONLY 10CC   Final    Culture  Setup Time 06/23/2012 21:09   Final    Culture     Final    Value:        BLOOD CULTURE RECEIVED NO GROWTH TO DATE CULTURE WILL BE HELD FOR 5 DAYS BEFORE ISSUING A FINAL NEGATIVE  REPORT   Report Status PENDING   Incomplete   URINE CULTURE     Status: Normal   Collection Time   06/23/12 11:09 PM      Component Value Range Status Comment   Specimen Description URINE, RANDOM   Final    Special Requests NONE   Final    Culture  Setup Time 06/23/2012 23:42   Final    Colony Count NO GROWTH   Final    Culture NO GROWTH   Final    Report Status 06/25/2012 FINAL   Final     Anti-infectives     Start     Dose/Rate Route Frequency Ordered Stop   06/26/12 0000   dextrose 5 % SOLN 50 mL with cefTRIAXone 2 G SOLR 2 g        2 g 100 mL/hr over 30 Minutes Intravenous Every 24 hours 06/26/12 1200     06/26/12 0000   sodium chloride 0.9 % SOLN 250 mL with vancomycin 10 G SOLR 1,250 mg        1,250 mg 166.7 mL/hr over 90 Minutes Intravenous Every 8 hours 06/26/12 1200     06/25/12 1200   cefTRIAXone (ROCEPHIN)  2 g in dextrose 5 % 50 mL IVPB        2 g 100 mL/hr over 30 Minutes Intravenous Every 24 hours 06/25/12 1102     06/24/12 2200   ceFEPIme (MAXIPIME) 2 g in dextrose 5 % 50 mL IVPB  Status:  Discontinued        2 g 100 mL/hr over 30 Minutes Intravenous Every 12 hours 06/24/12 1027 06/25/12 1102   06/24/12 2100   vancomycin (VANCOCIN) 1,250 mg in sodium chloride 0.9 % 250 mL IVPB        1,250 mg 166.7 mL/hr over 90 Minutes Intravenous Every 8 hours 06/24/12 1426     06/23/12 2200   vancomycin (VANCOCIN) IVPB 1000 mg/200 mL premix  Status:  Discontinued        1,000 mg 200 mL/hr over 60 Minutes Intravenous Every 8 hours 06/23/12 1933 06/24/12 1425   06/23/12 1400   ceFEPIme (MAXIPIME) 1 g in dextrose 5 % 50 mL IVPB  Status:  Discontinued        1 g 100 mL/hr over 30 Minutes Intravenous Every 12 hours 06/23/12 1238 06/24/12 1027   06/23/12 1245   vancomycin (VANCOCIN) IVPB 1000 mg/200 mL premix        1,000 mg 200 mL/hr over 60 Minutes Intravenous  Once 06/23/12 1237 06/23/12 1851   06/23/12 1245   metroNIDAZOLE (FLAGYL) IVPB 500 mg        500 mg 100 mL/hr  over 60 Minutes Intravenous  Once 06/23/12 1238 06/23/12 1505          Assessment: 31 y.o. M who continues on Vancomycin per Rx + Rocephin per MD, abx D#6/42. ID on board -- to continue current antibiotics. Cultures negative or no growth thus far. Last SCr 0.77 on 1/23 reflects stable renal function. Vancomycin dose remains appropriate at this time.   Goal of Therapy:  Vancomycin trough level 15-20 mcg/ml  Plan:  1. Continue vancomycin 1250 mg IV q8h 2. Will get SCr in the a.m to evaluate renal fxn and plan to get a Vancomycin trough later this week.   Georgina Pillion, PharmD, BCPS Clinical Pharmacist Pager: [phone] 06/28/2012 5:05 PM

## 2012-06-29 LAB — CULTURE, BLOOD (ROUTINE X 2)
Culture: NO GROWTH
Culture: NO GROWTH

## 2012-06-29 LAB — CREATININE, SERUM: Creatinine, Ser: 0.63 mg/dL (ref 0.50–1.35)

## 2012-06-29 NOTE — Clinical Social Work Note (Signed)
Clinical Social Work   CSW is continuing to follow for discharge planning needs. CSW is advocating for SNF placement for pt. [facility] shared they do not have any male beds at this time, however will contact this CSW when one becomes available. CSW will continue follow.   Dede Query, MSW, [facility] [phone]

## 2012-06-29 NOTE — Progress Notes (Signed)
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 Otelia Sergeant, ortho. Improvement with antibiotics. Currently on Vancomycin and Ceftriaxone. Dr. Ninetta Lights, ID, onboard. Discussed with interventional radiology, no amendable to Berg tap. Immobilization of the C-spine with soft cervical collar while seated or up with activity. PICC line. Will need long term antibiotics at least 4-6 weeks, ID to determine end date for his antibiotics.  Polysubstance abuse  Patient interested in cessation. Social work consulted, patient was counseled against substance abuse.   Nicotine abuse  Patient was strongly counseled against polysubstance abuse and nicotine abuse. He declined Berg nicotine patches.   Hypokalemia Resolved with replacement.  Hep C  Hep C genotype and RNA pending. Further management as outpatient. Patient to start Hep Berg and B vaccines on 06/26/2012.  Prophylaxis Heparin subcutaneous.  Code Status: Full code. Family Communication: No family at bedside. Patient updated. Disposition Plan: Pending. DC to SNF when bed is available.    Consultants:  Dr. Otelia Sergeant, Ortho  Dr. Ninetta Lights, ID  Procedures:  None  Antibiotics:  Cefepime 06/23/2012 >> 06/25/2012  Vancomycin 06/23/2012 >>  Ceftriaxone 06/25/2012 >>  HPI/Subjective: No specific concerns, except neck pain, unchanged from yesterday but improved from admission.  Objective: Filed Vitals:   06/28/12 0200 06/28/12 0600 06/28/12 1400 06/28/12 2149  BP: 108/67 109/71 117/65 108/67  Pulse: 81 67 72 75  Temp: 97.9 F (36.6 C) 98.1 F (36.7 C) 97.7 F (36.5 C) 97.9 F (36.6 C)  TempSrc:   Oral Oral  Resp: 16 16 20 17   Height:      Weight:      SpO2: 99% 98% 100% 97%   No intake or output data in the 24 hours  ending 06/29/12 0829 Filed Weights   06/23/12 1356 06/23/12 2145  Weight: 72.576 kg (160 lb) 66 kg (145 lb 8.1 oz)    Exam: Physical Exam: General: Awake, Oriented, No acute distress. HEENT: EOMI. Neck: Supple CV: S1 and S2 Lungs: Clear to ascultation bilaterally Abdomen: Soft, Nontender, Nondistended, +bowel sounds. Ext: Good pulses. Trace edema.  Data Reviewed: Basic Metabolic Panel:  Lab 06/29/12 4696 06/25/12 0715 06/24/12 0628 06/23/12 1311  NA -- 138 136 138  K -- 4.5 3.3* 4.0  CL -- 99 99 100  CO2 -- 27 25 26   GLUCOSE -- 111* 146* 98  BUN -- 9 9 13   CREATININE 0.63 0.77 0.64 0.75  CALCIUM -- 9.9 9.4 9.5  MG -- 2.1 -- --  PHOS -- -- -- --   Liver Function Tests: No results found for this basename: AST:5,ALT:5,ALKPHOS:5,BILITOT:5,PROT:5,ALBUMIN:5 in the last 168 hours No results found for this basename: LIPASE:5,AMYLASE:5 in the last 168 hours No results found for this basename: AMMONIA:5 in the last 168 hours CBC:  Lab 06/25/12 0715 06/24/12 0628 06/23/12 1311  WBC 7.8 8.3 10.1  NEUTROABS -- -- 7.2  HGB 15.6 14.6 16.7  HCT 43.5 41.3 46.1  MCV 86.7 86.6 87.0  PLT 244 260 253   Cardiac Enzymes: No results found for this basename: CKTOTAL:5,CKMB:5,CKMBINDEX:5,TROPONINI:5 in the last 168 hours BNP (last 3 results) No results found for this basename: PROBNP:3 in the last 8760 hours CBG: No results found for this basename: GLUCAP:5 in the last 168 hours  Recent Results (from the past 240 hour(s))  CULTURE, BLOOD (ROUTINE X 2)  Status: Normal   Collection Time   06/23/12  1:15 PM      Component Value Range Status Comment   Specimen Description BLOOD RIGHT ARM   Final    Special Requests BOTTLES DRAWN AEROBIC AND ANAEROBIC 5CC   Final    Culture  Setup Time 06/23/2012 21:08   Final    Culture NO GROWTH 5 DAYS   Final    Report Status 06/29/2012 FINAL   Final   CULTURE, BLOOD (ROUTINE X 2)     Status: Normal   Collection Time   06/23/12  1:15 PM       Component Value Range Status Comment   Specimen Description BLOOD LEFT ARM   Final    Special Requests BOTTLES DRAWN AEROBIC ONLY 10CC   Final    Culture  Setup Time 06/23/2012 21:09   Final    Culture NO GROWTH 5 DAYS   Final    Report Status 06/29/2012 FINAL   Final   URINE CULTURE     Status: Normal   Collection Time   06/23/12 11:09 PM      Component Value Range Status Comment   Specimen Description URINE, RANDOM   Final    Special Requests NONE   Final    Culture  Setup Time 06/23/2012 23:42   Final    Colony Count NO GROWTH   Final    Culture NO GROWTH   Final    Report Status 06/25/2012 FINAL   Final      Studies: No results found.  Scheduled Meds:    . cefTRIAXone (ROCEPHIN)  IV  2 g Intravenous Q24H  . heparin  5,000 Units Subcutaneous Q8H  . hepatitis Berg virus (PF) vaccine  1 mL Intramuscular Once  . methocarbamol (ROBAXIN) IV  500 mg Intravenous Q6H  . sodium chloride  3 mL Intravenous Q12H  . vancomycin  1,250 mg Intravenous Q8H   Continuous Infusions:    . sodium chloride 999 mL (06/24/12 3244)    Principal Problem:  *Abscess in epidural space of cervical spine Active Problems:  Cervical discitis  Polysubstance abuse  Nicotine abuse  Intractable neck pain with left UE weakness    Christopher Adell A, MD  [name] Pager [phone]. If 7PM-7AM, please contact night-coverage at [email or web address], password [facility] 06/29/2012, 8:29 AM  LOS: 6 days

## 2012-06-29 NOTE — Progress Notes (Signed)
Patient ID: Christopher Berg, male   DOB: 1980/12/06, 32 y.o.   MRN: [id number] Subjective:      Patient reports pain as moderate.    Objective:   VITALS:  Temp:  [97.7 F (36.5 C)-97.9 F (36.6 C)] 97.9 F (36.6 C) (01/26 2149) Pulse Rate:  [72-75] 75  (01/26 2149) Resp:  [17-20] 17  (01/26 2149) BP: (108-117)/(65-67) 108/67 mmHg (01/26 2149) SpO2:  [97 %-100 %] 97 % (01/26 2149) Awake alert, Oriented x 4 I have a chronic aching neck pain.Tolerating diet no hoarseness or dysphagia   Neurologically intact ABD soft Neurovascular intact Sensation intact distally Intact pulses distally Dorsiflexion/Plantar flexion intact No cellulitis present   LABS No results found for this basename: HGB:5,WBC:2,PLT:2 in the last 72 hours  Basename 06/29/12 0430  NA --  K --  CL --  CO2 --  BUN --  CREATININE 0.63  GLUCOSE --   No results found for this basename: LABPT:2,INR:2 in the last 72 hours   Assessment/Plan:    Cervical discitis, motor left arm is normal. Good response to current IV antibiotic regimen.  Stable orthopaedically, agree with plan to SNF placement due to history of drug use and need For supervision with indwelling PICCU line. High risk for use in polysubstance abuse. Advance diet Up with therapy Discharge to SNF Follow up in my office in 2 weeks.  INITKA,Christopher Berg 06/29/2012, 11:26 AM

## 2012-06-30 MED ORDER — OXYCODONE HCL ER 10 MG PO T12A: 10.0000 mg | EXTENDED_RELEASE_TABLET | Freq: Two times a day (BID) | ORAL | Status: DC

## 2012-06-30 MED ORDER — MORPHINE SULFATE ER 15 MG PO TBCR: 15.0000 mg | EXTENDED_RELEASE_TABLET | Freq: Two times a day (BID) | ORAL | Status: DC

## 2012-06-30 MED ORDER — OXYCODONE HCL 5 MG PO TABS
5.0000 mg | ORAL_TABLET | ORAL | Status: DC | PRN
  Administered 2012-06-30 – 2012-07-08 (×35): 10 mg via ORAL
  Filled 2012-06-30 (×35): qty 2

## 2012-06-30 MED ORDER — MORPHINE SULFATE ER 15 MG PO TBCR
15.0000 mg | EXTENDED_RELEASE_TABLET | Freq: Two times a day (BID) | ORAL | Status: DC
  Administered 2012-06-30 – 2012-07-02 (×6): 15 mg via ORAL
  Filled 2012-06-30 (×6): qty 1

## 2012-06-30 NOTE — Progress Notes (Signed)
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 Otelia Sergeant, ortho. Improvement with antibiotics. Currently on Vancomycin and Ceftriaxone. Dr. Ninetta Lights, ID, onboard. Discussed with interventional radiology, no amendable to a tap. Immobilization of the C-spine with soft cervical collar while seated or up with activity. PICC line. Will need long term antibiotics at least 4-6 weeks, ID to determine end date for his antibiotics. Start oxycodone 5-10 mg q4h PRN pain, start MS contin 15 mg BID for long acting pain control.  Polysubstance abuse  Patient interested in cessation. Social work consulted, patient was counseled against substance abuse.   Nicotine abuse  Patient was strongly counseled against polysubstance abuse and nicotine abuse. He declined a nicotine patches.   Hypokalemia Resolved with replacement.  Hep C  Hep C genotype pending. Hep C RNA is positive. Further management as outpatient. Patient to start Hep A and B vaccines on 06/26/2012.  Prophylaxis Heparin subcutaneous.  Code Status: Full code. Family Communication: No family at bedside. Patient updated. Disposition Plan: Pending. DC to SNF when bed is available.    Consultants:  Dr. Otelia Sergeant, Ortho  Dr. Ninetta Lights, ID  Procedures:  None  Antibiotics:  Cefepime 06/23/2012 >> 06/25/2012  Vancomycin 06/23/2012 >>  Ceftriaxone 06/25/2012 >>  HPI/Subjective: Complaining of worsening neck pain.  Objective: Filed Vitals:   06/28/12 2149 06/29/12 1418 06/29/12 2102 06/30/12 0627  BP: 108/67 118/72 116/79 108/63  Pulse: 75 73 78 63  Temp:  97.8 F (36.6 C) 98 F (36.7 C) 97.7 F (36.5 C)  TempSrc: Oral  Oral Oral  Resp: 17 16 18 18   Height:      Weight:      SpO2: 97% 97% 97% 99%     Intake/Output Summary (Last 24 hours) at 06/30/12 0924 Last data filed at 06/29/12 1720  Gross per 24 hour  Intake    240 ml  Output      0 ml  Net    240 ml   Filed Weights   06/23/12 1356 06/23/12 2145  Weight: 72.576 kg (160 lb) 66 kg (145 lb 8.1 oz)    Exam: Physical Exam: General: Awake, Oriented, No acute distress. HEENT: EOMI. Neck: Supple CV: S1 and S2 Lungs: Clear to ascultation bilaterally Abdomen: Soft, Nontender, Nondistended, +bowel sounds. Ext: Good pulses. Trace edema.  Data Reviewed: Basic Metabolic Panel:  Lab 06/29/12 9562 06/25/12 0715 06/24/12 0628 06/23/12 1311  NA -- 138 136 138  K -- 4.5 3.3* 4.0  CL -- 99 99 100  CO2 -- 27 25 26   GLUCOSE -- 111* 146* 98  BUN -- 9 9 13   CREATININE 0.63 0.77 0.64 0.75  CALCIUM -- 9.9 9.4 9.5  MG -- 2.1 -- --  PHOS -- -- -- --   Liver Function Tests: No results found for this basename: AST:5,ALT:5,ALKPHOS:5,BILITOT:5,PROT:5,ALBUMIN:5 in the last 168 hours No results found for this basename: LIPASE:5,AMYLASE:5 in the last 168 hours No results found for this basename: AMMONIA:5 in the last 168 hours CBC:  Lab 06/25/12 0715 06/24/12 0628 06/23/12 1311  WBC 7.8 8.3 10.1  NEUTROABS -- -- 7.2  HGB 15.6 14.6 16.7  HCT 43.5 41.3 46.1  MCV 86.7 86.6 87.0  PLT 244 260 253   Cardiac Enzymes: No results found for this basename: CKTOTAL:5,CKMB:5,CKMBINDEX:5,TROPONINI:5 in the last 168 hours BNP (last 3 results)  No results found for this basename: PROBNP:3 in the last 8760 hours CBG: No results found for this basename: GLUCAP:5 in the last 168 hours  Recent Results (from the past 240 hour(s))  CULTURE, BLOOD (ROUTINE X 2)     Status: Normal   Collection Time   06/23/12  1:15 PM      Component Value Range Status Comment   Specimen Description BLOOD RIGHT ARM   Final    Special Requests BOTTLES DRAWN AEROBIC AND ANAEROBIC 5CC   Final    Culture  Setup Time 06/23/2012 21:08   Final    Culture NO GROWTH 5 DAYS    Final    Report Status 06/29/2012 FINAL   Final   CULTURE, BLOOD (ROUTINE X 2)     Status: Normal   Collection Time   06/23/12  1:15 PM      Component Value Range Status Comment   Specimen Description BLOOD LEFT ARM   Final    Special Requests BOTTLES DRAWN AEROBIC ONLY 10CC   Final    Culture  Setup Time 06/23/2012 21:09   Final    Culture NO GROWTH 5 DAYS   Final    Report Status 06/29/2012 FINAL   Final   URINE CULTURE     Status: Normal   Collection Time   06/23/12 11:09 PM      Component Value Range Status Comment   Specimen Description URINE, RANDOM   Final    Special Requests NONE   Final    Culture  Setup Time 06/23/2012 23:42   Final    Colony Count NO GROWTH   Final    Culture NO GROWTH   Final    Report Status 06/25/2012 FINAL   Final      Studies: No results found.  Scheduled Meds:    . cefTRIAXone (ROCEPHIN)  IV  2 g Intravenous Q24H  . heparin  5,000 Units Subcutaneous Q8H  . hepatitis A virus (PF) vaccine  1 mL Intramuscular Once  . methocarbamol (ROBAXIN) IV  500 mg Intravenous Q6H  . OxyCODONE  10 mg Oral Q12H  . sodium chloride  3 mL Intravenous Q12H  . vancomycin  1,250 mg Intravenous Q8H   Continuous Infusions:    . sodium chloride 999 mL (06/24/12 4098)    Principal Problem:  *Abscess in epidural space of cervical spine Active Problems:  Cervical discitis  Polysubstance abuse  Nicotine abuse  Intractable neck pain with left UE weakness    Giovoni Bunch A, MD  [name] Pager [phone]. If 7PM-7AM, please contact night-coverage at [email or web address], password [facility] 06/30/2012, 9:24 AM  LOS: 7 days

## 2012-07-01 LAB — BASIC METABOLIC PANEL
CO2: 28 mEq/L (ref 19–32)
Calcium: 10 mg/dL (ref 8.4–10.5)
GFR calc non Af Amer: >90 mL/min (ref >90)
Sodium: 139 mEq/L (ref 135–145)

## 2012-07-01 LAB — VANCOMYCIN, TROUGH: Vancomycin Tr: 15.5 ug/mL (ref 10.0–20.0)

## 2012-07-01 NOTE — Progress Notes (Signed)
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 Otelia Sergeant, ortho. Improvement with antibiotics. Currently on Vancomycin and Ceftriaxone. Dr. Ninetta Lights, ID, onboard. Discussed with interventional radiology, no amendable to a tap. Immobilization of the C-spine with soft cervical collar while seated or up with activity. PICC line. Will need long term antibiotics at least 4-6 weeks, ID to determine end date for his antibiotics. Start oxycodone 5-10 mg q4h PRN pain, start MS contin 15 mg BID for long acting pain control.  Polysubstance abuse  Patient interested in cessation. Social work consulted, patient was counseled against substance abuse.   Nicotine abuse  Patient was strongly counseled against polysubstance abuse and nicotine abuse. He declined a nicotine patches.   Hypokalemia Resolved with replacement.  Hep C  Hep C genotype pending. Hep C RNA is positive. Further management as outpatient. Patient to start Hep A and B vaccines on 06/26/2012.  Prophylaxis Heparin subcutaneous.  Code Status: Full code. Family Communication: No family at bedside. Patient updated. Disposition Plan: Pending. DC to SNF when bed is available.    Consultants:  Dr. Otelia Sergeant, Ortho  Dr. Ninetta Lights, ID  Procedures:  None  Antibiotics:  Cefepime 06/23/2012 >> 06/25/2012  Vancomycin 06/23/2012 >>  Ceftriaxone 06/25/2012 >>  HPI/Subjective: Patient seen, no new complaints today.  Objective: Filed Vitals:   06/30/12 1400 06/30/12 2207 07/01/12 0628 07/01/12 1321  BP: 117/69 119/78 112/69 123/82  Pulse: 75 71 63 90  Temp: 98 F (36.7 C) 98.1 F (36.7 C) 97.9 F (36.6 C) 98.1 F (36.7 C)  TempSrc: Oral Oral Oral Oral  Resp: 18 18 18 20   Height:      Weight:      SpO2: 95%  98% 98% 98%    Intake/Output Summary (Last 24 hours) at 07/01/12 1501 Last data filed at 07/01/12 1321  Gross per 24 hour  Intake    480 ml  Output    300 ml  Net    180 ml   Filed Weights   06/23/12 1356 06/23/12 2145  Weight: 72.576 kg (160 lb) 66 kg (145 lb 8.1 oz)    Exam: Physical Exam: General: Awake, Oriented, No acute distress. HEENT: EOMI. Neck: Supple CV: S1 and S2 Lungs: Clear to ascultation bilaterally Abdomen: Soft, Nontender, Nondistended, +bowel sounds. Ext : No edema  Data Reviewed: Basic Metabolic Panel:  Lab 07/01/12 8119 06/29/12 0430 06/25/12 0715  NA 139 -- 138  K 4.1 -- 4.5  CL 99 -- 99  CO2 28 -- 27  GLUCOSE 95 -- 111*  BUN 11 -- 9  CREATININE 0.67 0.63 0.77  CALCIUM 10.0 -- 9.9  MG -- -- 2.1  PHOS -- -- --   Liver Function Tests: No results found for this basename: AST:5,ALT:5,ALKPHOS:5,BILITOT:5,PROT:5,ALBUMIN:5 in the last 168 hours No results found for this basename: LIPASE:5,AMYLASE:5 in the last 168 hours No results found for this basename: AMMONIA:5 in the last 168 hours CBC:  Lab 06/25/12 0715  WBC 7.8  NEUTROABS --  HGB 15.6  HCT 43.5  MCV 86.7  PLT 244   Cardiac Enzymes: No results found for this basename: CKTOTAL:5,CKMB:5,CKMBINDEX:5,TROPONINI:5 in the last 168 hours BNP (last 3 results) No results found for this basename: PROBNP:3 in the last 8760 hours CBG: No results found for this basename: GLUCAP:5 in the last 168 hours  Recent  Results (from the past 240 hour(s))  CULTURE, BLOOD (ROUTINE X 2)     Status: Normal   Collection Time   06/23/12  1:15 PM      Component Value Range Status Comment   Specimen Description BLOOD RIGHT ARM   Final    Special Requests BOTTLES DRAWN AEROBIC AND ANAEROBIC 5CC   Final    Culture  Setup Time 06/23/2012 21:08   Final    Culture NO GROWTH 5 DAYS   Final    Report Status 06/29/2012 FINAL   Final   CULTURE, BLOOD (ROUTINE X 2)     Status: Normal   Collection Time   06/23/12   1:15 PM      Component Value Range Status Comment   Specimen Description BLOOD LEFT ARM   Final    Special Requests BOTTLES DRAWN AEROBIC ONLY 10CC   Final    Culture  Setup Time 06/23/2012 21:09   Final    Culture NO GROWTH 5 DAYS   Final    Report Status 06/29/2012 FINAL   Final   URINE CULTURE     Status: Normal   Collection Time   06/23/12 11:09 PM      Component Value Range Status Comment   Specimen Description URINE, RANDOM   Final    Special Requests NONE   Final    Culture  Setup Time 06/23/2012 23:42   Final    Colony Count NO GROWTH   Final    Culture NO GROWTH   Final    Report Status 06/25/2012 FINAL   Final      Studies: No results found.  Scheduled Meds:    . cefTRIAXone (ROCEPHIN)  IV  2 g Intravenous Q24H  . heparin  5,000 Units Subcutaneous Q8H  . hepatitis A virus (PF) vaccine  1 mL Intramuscular Once  . methocarbamol (ROBAXIN) IV  500 mg Intravenous Q6H  . morphine  15 mg Oral Q12H  . sodium chloride  3 mL Intravenous Q12H  . vancomycin  1,250 mg Intravenous Q8H   Continuous Infusions:    . sodium chloride 250 mL (06/30/12 1200)    Principal Problem:  *Abscess in epidural space of cervical spine Active Problems:  Cervical discitis  Polysubstance abuse  Nicotine abuse  Intractable neck pain with left UE weakness    Meredeth Ide, MD  [name] Pager [phone]. If 7PM-7AM, please contact night-coverage at [email or web address], password [facility] 07/01/2012, 3:01 PM  LOS: 8 days

## 2012-07-01 NOTE — Progress Notes (Signed)
ANTIBIOTIC CONSULT NOTE - FOLLOW UP  Pharmacy Consult for Vanco Indication:  cervical epidural abscess with diskitis,  No Known Allergies  Patient Measurements: Height: 5\' 11"  (180.3 cm) Weight: 145 lb 8.1 oz (66 kg) IBW/kg (Calculated) : 75.3  Adjusted Body Weight:   Vital Signs: Temp: 97.9 F (36.6 C) (01/29 0628) Temp src: Oral (01/29 0628) BP: 112/69 mmHg (01/29 0628) Pulse Rate: 63  (01/29 0628) Intake/Output from previous day:   Intake/Output from this shift:    Labs:  Basename 07/01/12 0433 06/29/12 0430  WBC -- --  HGB -- --  PLT -- --  LABCREA -- --  CREATININE 0.67 0.63   Estimated Creatinine Clearance: 126 ml/min (by C-G formula based on Cr of 0.67).  Basename 07/01/12 0433  VANCOTROUGH 15.5  VANCOPEAK --  VANCORANDOM --  GENTTROUGH --  GENTPEAK --  GENTRANDOM --  TOBRATROUGH --  TOBRAPEAK --  TOBRARND --  AMIKACINPEAK --  AMIKACINTROU --  AMIKACIN --     Microbiology: Recent Results (from the past 720 hour(s))  CULTURE, BLOOD (ROUTINE X 2)     Status: Normal   Collection Time   06/23/12  1:15 PM      Component Value Range Status Comment   Specimen Description BLOOD RIGHT ARM   Final    Special Requests BOTTLES DRAWN AEROBIC AND ANAEROBIC 5CC   Final    Culture  Setup Time 06/23/2012 21:08   Final    Culture NO GROWTH 5 DAYS   Final    Report Status 06/29/2012 FINAL   Final   CULTURE, BLOOD (ROUTINE X 2)     Status: Normal   Collection Time   06/23/12  1:15 PM      Component Value Range Status Comment   Specimen Description BLOOD LEFT ARM   Final    Special Requests BOTTLES DRAWN AEROBIC ONLY 10CC   Final    Culture  Setup Time 06/23/2012 21:09   Final    Culture NO GROWTH 5 DAYS   Final    Report Status 06/29/2012 FINAL   Final   URINE CULTURE     Status: Normal   Collection Time   06/23/12 11:09 PM      Component Value Range Status Comment   Specimen Description URINE, RANDOM   Final    Special Requests NONE   Final    Culture   Setup Time 06/23/2012 23:42   Final    Colony Count NO GROWTH   Final    Culture NO GROWTH   Final    Report Status 06/25/2012 FINAL   Final     Anti-infectives     Start     Dose/Rate Route Frequency Ordered Stop   06/26/12 0000   dextrose 5 % SOLN 50 mL with cefTRIAXone 2 G SOLR 2 g        2 g 100 mL/hr over 30 Minutes Intravenous Every 24 hours 06/26/12 1200     06/26/12 0000   sodium chloride 0.9 % SOLN 250 mL with vancomycin 10 G SOLR 1,250 mg        1,250 mg 166.7 mL/hr over 90 Minutes Intravenous Every 8 hours 06/26/12 1200     06/25/12 1200   cefTRIAXone (ROCEPHIN) 2 g in dextrose 5 % 50 mL IVPB        2 g 100 mL/hr over 30 Minutes Intravenous Every 24 hours 06/25/12 1102     06/24/12 2200   ceFEPIme (MAXIPIME) 2 g in dextrose 5 %  50 mL IVPB  Status:  Discontinued        2 g 100 mL/hr over 30 Minutes Intravenous Every 12 hours 06/24/12 1027 06/25/12 1102   06/24/12 2100   vancomycin (VANCOCIN) 1,250 mg in sodium chloride 0.9 % 250 mL IVPB        1,250 mg 166.7 mL/hr over 90 Minutes Intravenous Every 8 hours 06/24/12 1426     06/23/12 2200   vancomycin (VANCOCIN) IVPB 1000 mg/200 mL premix  Status:  Discontinued        1,000 mg 200 mL/hr over 60 Minutes Intravenous Every 8 hours 06/23/12 1933 06/24/12 1425   06/23/12 1400   ceFEPIme (MAXIPIME) 1 g in dextrose 5 % 50 mL IVPB  Status:  Discontinued        1 g 100 mL/hr over 30 Minutes Intravenous Every 12 hours 06/23/12 1238 06/24/12 1027   06/23/12 1245   vancomycin (VANCOCIN) IVPB 1000 mg/200 mL premix        1,000 mg 200 mL/hr over 60 Minutes Intravenous  Once 06/23/12 1237 06/23/12 1851   06/23/12 1245   metroNIDAZOLE (FLAGYL) IVPB 500 mg        500 mg 100 mL/hr over 60 Minutes Intravenous  Once 06/23/12 1238 06/23/12 1505          Assessment: 32 y/o male patient admitted with neck pain, found to have C5-6 diskitis with abscess requiring broad spectrum antibiotics.  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 ID on board. Afeb, WBC wnl. For Hep A & B vaccines, to check Hep C DNA and phenotype  1/21 Vanc >>  1/22 VT= 8.7 on 1g/8h  1/23 VT=14.3 on 1250mg /8h  1/29 VT=15.5 on 1250mg /8hr 1/21 Cefepime >> 1/23 1/23 CTX >>  1/21 blood x2>>negative 1/21 urine>> neg   Goal of Therapy:  Vancomycin trough level 15-20 mcg/ml  Plan:  Cont Vanc 1250 mg/8h. Recheck trough weekly. D/C to SNF when bed available.  Merilynn Finland, [employer] 07/01/2012,8:42 AM

## 2012-07-01 NOTE — Clinical Social Work Note (Signed)
Clinical Social Work   CSW spoke with admissions Nurse, adult at [facility]. Per [facility], they are able to extend a bed offer, however they availability of a male bed in unknown at this time. CSW will continue to follow.   Dede Query, MSW, Theresia Majors [phone]

## 2012-07-02 MED ORDER — HYDROMORPHONE HCL PF 1 MG/ML IJ SOLN
1.0000 mg | Freq: Once | INTRAMUSCULAR | Status: AC
  Administered 2012-07-02: 1 mg via INTRAVENOUS
  Filled 2012-07-02: qty 1

## 2012-07-02 MED ORDER — ALPRAZOLAM 0.5 MG PO TABS
0.5000 mg | ORAL_TABLET | Freq: Three times a day (TID) | ORAL | Status: DC | PRN
  Administered 2012-07-02 – 2012-07-08 (×8): 0.5 mg via ORAL
  Filled 2012-07-02 (×8): qty 1

## 2012-07-02 NOTE — Progress Notes (Signed)
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 Otelia Sergeant, ortho. Improvement with antibiotics. Currently on Vancomycin and Ceftriaxone. Dr. Ninetta Lights, ID, onboard. Discussed with interventional radiology, no amendable to a tap. Immobilization of the C-spine with soft cervical collar while seated or up with activity. PICC line. Will need long term antibiotics at least 4-6 weeks, Start oxycodone 5-10 mg q4h PRN pain, start MS contin 15 mg BID for long acting pain control.  Polysubstance abuse  Patient interested in cessation. Social work consulted, patient was counseled against substance abuse.   Nicotine abuse  Patient was strongly counseled against polysubstance abuse and nicotine abuse. He declined a nicotine patches.   Hypokalemia Resolved with replacement.  Hep C  Hep C genotype pending. Hep C RNA is positive. Further management as outpatient. Patient to start Hep A and B vaccines on 06/26/2012.  Prophylaxis Heparin subcutaneous.  Code Status: Full code. Family Communication: No family at bedside. Patient updated. Disposition Plan: Pending. DC to SNF when bed is available.    Consultants:  Dr. Otelia Sergeant, Ortho  Dr. Ninetta Lights, ID  Procedures:  None  Antibiotics:  Cefepime 06/23/2012 >> 06/25/2012  Vancomycin 06/23/2012 >>  Ceftriaxone 06/25/2012 >>  HPI/Subjective: Patient seen, no new complaints today.  Objective: Filed Vitals:   07/01/12 1321 07/01/12 1813 07/01/12 2211 07/02/12 0612  BP: 123/82 126/75 124/85 111/75  Pulse: 90 78 78 71  Temp: 98.1 F (36.7 C) 98.1 F (36.7 C) 97.8 F (36.6 C) 98 F (36.7 C)  TempSrc: Oral   Oral  Resp: 20 16 18 18   Height:      Weight:      SpO2: 98% 97% 98% 99%    Intake/Output Summary (Last 24  hours) at 07/02/12 1228 Last data filed at 07/01/12 1712  Gross per 24 hour  Intake    480 ml  Output      0 ml  Net    480 ml   Filed Weights   06/23/12 1356 06/23/12 2145  Weight: 72.576 kg (160 lb) 66 kg (145 lb 8.1 oz)    Exam: Physical Exam: General: Awake, Oriented, No acute distress. HEENT: EOMI. Neck: Supple CV: S1 and S2 Lungs: Clear to ascultation bilaterally Abdomen: Soft, Nontender, Nondistended, +bowel sounds. Ext : No edema  Data Reviewed: Basic Metabolic Panel:  Lab 07/01/12 8119 06/29/12 0430  NA 139 --  K 4.1 --  CL 99 --  CO2 28 --  GLUCOSE 95 --  BUN 11 --  CREATININE 0.67 0.63  CALCIUM 10.0 --  MG -- --  PHOS -- --     Recent Results (from the past 240 hour(s))  CULTURE, BLOOD (ROUTINE X 2)     Status: Normal   Collection Time   06/23/12  1:15 PM      Component Value Range Status Comment   Specimen Description BLOOD RIGHT ARM   Final    Special Requests BOTTLES DRAWN AEROBIC AND ANAEROBIC 5CC   Final    Culture  Setup Time 06/23/2012 21:08   Final    Culture NO GROWTH 5 DAYS   Final    Report Status 06/29/2012 FINAL   Final   CULTURE, BLOOD (ROUTINE X 2)     Status: Normal   Collection Time   06/23/12  1:15 PM      Component  Value Range Status Comment   Specimen Description BLOOD LEFT ARM   Final    Special Requests BOTTLES DRAWN AEROBIC ONLY 10CC   Final    Culture  Setup Time 06/23/2012 21:09   Final    Culture NO GROWTH 5 DAYS   Final    Report Status 06/29/2012 FINAL   Final   URINE CULTURE     Status: Normal   Collection Time   06/23/12 11:09 PM      Component Value Range Status Comment   Specimen Description URINE, RANDOM   Final    Special Requests NONE   Final    Culture  Setup Time 06/23/2012 23:42   Final    Colony Count NO GROWTH   Final    Culture NO GROWTH   Final    Report Status 06/25/2012 FINAL   Final      Studies: No results found.  Scheduled Meds:    . cefTRIAXone (ROCEPHIN)  IV  2 g Intravenous Q24H  .  heparin  5,000 Units Subcutaneous Q8H  . hepatitis A virus (PF) vaccine  1 mL Intramuscular Once  . methocarbamol (ROBAXIN) IV  500 mg Intravenous Q6H  . morphine  15 mg Oral Q12H  . sodium chloride  3 mL Intravenous Q12H  . vancomycin  1,250 mg Intravenous Q8H   Continuous Infusions:    . sodium chloride 250 mL (06/30/12 1200)    Principal Problem:  *Abscess in epidural space of cervical spine Active Problems:  Cervical discitis  Polysubstance abuse  Nicotine abuse  Intractable neck pain with left UE weakness    Meredeth Ide, MD  [name] Pager [phone]. If 7PM-7AM, please contact night-coverage at [email or web address], password [facility] 07/02/2012, 12:28 PM  LOS: 9 days

## 2012-07-02 NOTE — Progress Notes (Signed)
Pt was requesting for something stronger for his pain and also anxiety, Dr. Sharl Ma notified, orders received.----Ryer Asato, rn

## 2012-07-02 NOTE — Clinical Social Work Note (Signed)
Clinical Social Work   CSW met with pt to update him on discharge plan. Pt is agreeable to [facility].   Pt inquired about assistance with making his child support payments. CSW referred pt to his case worker for further assistance. CSW will assist, if appropriate. Pt will call case worker. CSW will follow up with pt.   CSW will continue to follow.   Dede Query, MSW, [facility]resia Majors [phone]

## 2012-07-03 MED ORDER — CYCLOBENZAPRINE HCL 10 MG PO TABS
10.0000 mg | ORAL_TABLET | Freq: Three times a day (TID) | ORAL | Status: DC
  Administered 2012-07-03 – 2012-07-08 (×17): 10 mg via ORAL
  Filled 2012-07-03 (×24): qty 1

## 2012-07-03 MED ORDER — OXYCODONE HCL ER 15 MG PO T12A
15.0000 mg | EXTENDED_RELEASE_TABLET | Freq: Two times a day (BID) | ORAL | Status: DC
  Administered 2012-07-03 – 2012-07-08 (×11): 15 mg via ORAL
  Filled 2012-07-03 (×11): qty 1

## 2012-07-03 NOTE — Progress Notes (Signed)
ANTIBIOTIC CONSULT NOTE - FOLLOW UP  Pharmacy Consult for Vanco Indication: cervical epidural abscess with diskitis  No Known Allergies  Patient Measurements: Height: 5\' 11"  (180.3 cm) Weight: 145 lb 8.1 oz (66 kg) IBW/kg (Calculated) : 75.3  Adjusted Body Weight:   Vital Signs: Temp: 98.3 F (36.8 C) (01/31 0545) Temp src: Oral (01/31 0545) BP: 122/73 mmHg (01/31 0545) Pulse Rate: 74  (01/31 0545) Intake/Output from previous day: 01/30 0701 - 01/31 0700 In: 600 [P.O.:600] Out: -  Intake/Output from this shift:    Labs:  Basename 07/01/12 0433  WBC --  HGB --  PLT --  LABCREA --  CREATININE 0.67   Estimated Creatinine Clearance: 126 ml/min (by C-G formula based on Cr of 0.67).  Basename 07/01/12 0433  VANCOTROUGH 15.5  VANCOPEAK --  VANCORANDOM --  GENTTROUGH --  GENTPEAK --  GENTRANDOM --  TOBRATROUGH --  TOBRAPEAK --  TOBRARND --  AMIKACINPEAK --  AMIKACINTROU --  AMIKACIN --     Microbiology: Recent Results (from the past 720 hour(s))  CULTURE, BLOOD (ROUTINE X 2)     Status: Normal   Collection Time   06/23/12  1:15 PM      Component Value Range Status Comment   Specimen Description BLOOD RIGHT ARM   Final    Special Requests BOTTLES DRAWN AEROBIC AND ANAEROBIC 5CC   Final    Culture  Setup Time 06/23/2012 21:08   Final    Culture NO GROWTH 5 DAYS   Final    Report Status 06/29/2012 FINAL   Final   CULTURE, BLOOD (ROUTINE X 2)     Status: Normal   Collection Time   06/23/12  1:15 PM      Component Value Range Status Comment   Specimen Description BLOOD LEFT ARM   Final    Special Requests BOTTLES DRAWN AEROBIC ONLY 10CC   Final    Culture  Setup Time 06/23/2012 21:09   Final    Culture NO GROWTH 5 DAYS   Final    Report Status 06/29/2012 FINAL   Final   URINE CULTURE     Status: Normal   Collection Time   06/23/12 11:09 PM      Component Value Range Status Comment   Specimen Description URINE, RANDOM   Final    Special Requests NONE    Final    Culture  Setup Time 06/23/2012 23:42   Final    Colony Count NO GROWTH   Final    Culture NO GROWTH   Final    Report Status 06/25/2012 FINAL   Final     Anti-infectives     Start     Dose/Rate Route Frequency Ordered Stop   06/26/12 0000   dextrose 5 % SOLN 50 mL with cefTRIAXone 2 G SOLR 2 g        2 g 100 mL/hr over 30 Minutes Intravenous Every 24 hours 06/26/12 1200     06/26/12 0000   sodium chloride 0.9 % SOLN 250 mL with vancomycin 10 G SOLR 1,250 mg        1,250 mg 166.7 mL/hr over 90 Minutes Intravenous Every 8 hours 06/26/12 1200     06/25/12 1200   cefTRIAXone (ROCEPHIN) 2 g in dextrose 5 % 50 mL IVPB        2 g 100 mL/hr over 30 Minutes Intravenous Every 24 hours 06/25/12 1102     06/24/12 2200   ceFEPIme (MAXIPIME) 2 g in dextrose  5 % 50 mL IVPB  Status:  Discontinued        2 g 100 mL/hr over 30 Minutes Intravenous Every 12 hours 06/24/12 1027 06/25/12 1102   06/24/12 2100   vancomycin (VANCOCIN) 1,250 mg in sodium chloride 0.9 % 250 mL IVPB        1,250 mg 166.7 mL/hr over 90 Minutes Intravenous Every 8 hours 06/24/12 1426     06/23/12 2200   vancomycin (VANCOCIN) IVPB 1000 mg/200 mL premix  Status:  Discontinued        1,000 mg 200 mL/hr over 60 Minutes Intravenous Every 8 hours 06/23/12 1933 06/24/12 1425   06/23/12 1400   ceFEPIme (MAXIPIME) 1 g in dextrose 5 % 50 mL IVPB  Status:  Discontinued        1 g 100 mL/hr over 30 Minutes Intravenous Every 12 hours 06/23/12 1238 06/24/12 1027   06/23/12 1245   vancomycin (VANCOCIN) IVPB 1000 mg/200 mL premix        1,000 mg 200 mL/hr over 60 Minutes Intravenous  Once 06/23/12 1237 06/23/12 1851   06/23/12 1245   metroNIDAZOLE (FLAGYL) IVPB 500 mg        500 mg 100 mL/hr over 60 Minutes Intravenous  Once 06/23/12 1238 06/23/12 1505          Assessment: 32 y/o male patient admitted with neck pain, found to have C5-6 diskitis with abscess requiring broad spectrum antibiotics.  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 ID on board. Afeb, WBC wnl. For Hep A & B vaccines, to check Hep C DNA and phenotype  1/23 CTX >> 1/21 Vanc >> 1/22 VT= 8.7 on 1g/8h 1/23 VT=14.3 on 1250mg /8h 1/29 VT=15.5 on 1250mg /8hr 1/21 Cefepime >> 1/23  1/21 blood x2>>negative 1/21 urine>> neg  Cardiovascular: VSS - no meds  Endocrinology: not diabetic, glucose 95  Gastrointestinal / Nutrition: regular diet, + Hep C.  Neurology: A&O- hx substance abuse + nicotine. Start oxycodone 5-10 mg q4h PRN pain, start MS contin 15 mg BID for long acting pain control, Flexeril  Nephrology: Last Scr 0.67 stable, K+ 4.1, crcl 100+.  Pulmonary: hx Tobacco, 97% on RA  Hematology / Oncology: CBC good  PTA Medication Issues: only on prn pain meds and Flexeril prn pta   Goal of Therapy:  Vancomycin trough level 15-20 mcg/ml  Plan:   Cont Vanc 1250 mg/8h. Recheck trough weekly (next Wednesday) D/C to SNF when bed available.   Merilynn Finland, [employer] 07/03/2012,10:16 AM

## 2012-07-03 NOTE — Progress Notes (Signed)
[name] PROGRESS NOTE  Christopher Berg ZOX:[id number] DOB: 06/08/1980 DOA: 06/23/2012 PCP: Default, Provider, MD  Assessment/Plan: 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 Otelia Sergeant, ortho. Improvement with antibiotics. Currently on Vancomycin and Ceftriaxone. Dr. Ninetta Lights, ID, onboard. Discussed with interventional radiology, no amendable to a tap. Immobilization of the C-spine with soft cervical collar while seated or up with activity. PICC line. Will need long term antibiotics at least 4-6 weeks, Start oxycodone 5-10 mg q4h PRN pain, Will change the MS contin to oxycontin 15 mg Po BID. Also change Robaxin to flexeril 10 mg Po TID Polysubstance abuse  Patient interested in cessation. Social work consulted, patient was counseled against substance abuse.   Nicotine abuse  Patient was strongly counseled against polysubstance abuse and nicotine abuse. He declined a nicotine patches.   Hypokalemia Resolved with replacement.  Hep C  Hep C genotype pending. Hep C RNA is positive. Further management as outpatient. Patient to start Hep A and B vaccines on 06/26/2012.  Prophylaxis Heparin subcutaneous.  Code Status: Full code. Family Communication: No family at bedside. Patient updated. Disposition Plan: Pending. DC to SNF when bed is available.    Consultants:  Dr. Otelia Sergeant, Ortho  Dr. Ninetta Lights, ID  Procedures:  None  Antibiotics:  Cefepime 06/23/2012 >> 06/25/2012  Vancomycin 06/23/2012 >>  Ceftriaxone 06/25/2012 >>  HPI/Subjective: Patient seen,complains of worsening pain. Pain not well controlled with MS contin  Objective: Filed Vitals:   07/02/12 1400 07/02/12 2217 07/03/12 0545 07/03/12 1000  BP: 115/79 120/84 122/73 117/76  Pulse: 82 79 74 78  Temp: 97.8 F (36.6 C) 97.8 F (36.6 C) 98.3 F (36.8 C) 97.7 F (36.5 C)  TempSrc: Oral Oral Oral Oral  Resp: 18 18 15 18    Height:      Weight:      SpO2: 96% 98% 98% 96%   No intake or output data in the 24 hours ending 07/03/12 1436 Filed Weights   06/23/12 1356 06/23/12 2145  Weight: 72.576 kg (160 lb) 66 kg (145 lb 8.1 oz)    Exam: Physical Exam: General: Awake, Oriented, No acute distress. HEENT: EOMI. Neck: Supple CV: S1 and S2 Lungs: Clear to ascultation bilaterally Abdomen: Soft, Nontender, Nondistended, +bowel sounds. Ext : No edema  Data Reviewed: Basic Metabolic Panel:  Lab 07/01/12 8119 06/29/12 0430  NA 139 --  K 4.1 --  CL 99 --  CO2 28 --  GLUCOSE 95 --  BUN 11 --  CREATININE 0.67 0.63  CALCIUM 10.0 --  MG -- --  PHOS -- --     Recent Results (from the past 240 hour(s))  URINE CULTURE     Status: Normal   Collection Time   06/23/12 11:09 PM      Component Value Range Status Comment   Specimen Description URINE, RANDOM   Final    Special Requests NONE   Final    Culture  Setup Time 06/23/2012 23:42   Final    Colony Count NO GROWTH   Final    Culture NO GROWTH   Final    Report Status 06/25/2012 FINAL   Final      Studies: No results found.  Scheduled Meds:    . cefTRIAXone (ROCEPHIN)  IV  2 g Intravenous Q24H  . cyclobenzaprine  10 mg Oral TID  . heparin  5,000 Units Subcutaneous Q8H  . hepatitis A virus (PF) vaccine  1  mL Intramuscular Once  . OxyCODONE  15 mg Oral Q12H  . sodium chloride  3 mL Intravenous Q12H  . vancomycin  1,250 mg Intravenous Q8H   Continuous Infusions:    . sodium chloride 250 mL (06/30/12 1200)    Principal Problem:  *Abscess in epidural space of cervical spine Active Problems:  Cervical discitis  Polysubstance abuse  Nicotine abuse  Intractable neck pain with left UE weakness    Meredeth Ide, MD  [name] Pager [phone]. If 7PM-7AM, please contact night-coverage at [email or web address], password [facility] 07/03/2012, 2:36 PM  LOS: 10 days

## 2012-07-04 DIAGNOSIS — G062 Extradural and subdural abscess, unspecified: Secondary | ICD-10-CM

## 2012-07-04 NOTE — Progress Notes (Signed)
[name] PROGRESS NOTE  Christopher Berg AVW:[id number] DOB: 1980-11-23 DOA: 06/23/2012 PCP: Default, Provider, MD  Assessment/Plan:  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 Otelia Sergeant, ortho. Improvement with antibiotics. Currently on Vancomycin and Ceftriaxone. Dr. Ninetta Lights, ID, onboard. Discussed with interventional radiology, no amendable to a tap. Immobilization of the C-spine with soft cervical collar while seated or up with activity. PICC line. Will need long term antibiotics at least 4-6 weeks, Start oxycodone 5-10 mg q4h PRN pain, Will change the MS contin to oxycontin 15 mg Po BID. Also change Robaxin to flexeril 10 mg Po TID  Polysubstance abuse  Patient interested in cessation. Social work consulted, patient was counseled against substance abuse.   Nicotine abuse  Patient was strongly counseled against polysubstance abuse and nicotine abuse. He declined a nicotine patches.   Hypokalemia Resolved with replacement.  Hep C  Hep C genotype pending. Hep C RNA is positive. Further management as outpatient. Patient to start Hep A and B vaccines on 06/26/2012.  Prophylaxis Heparin subcutaneous.  Code Status: Full code. Family Communication: No family at bedside. Patient updated. Disposition Plan: Pending. DC to SNF when bed is available.    Consultants:  Dr. Otelia Sergeant, Ortho  Dr. Ninetta Lights, ID  Procedures:  None  Antibiotics:  Cefepime 06/23/2012 >> 06/25/2012  Vancomycin 06/23/2012 >>  Ceftriaxone 06/25/2012 >>  HPI/Subjective: Patient seen, says the pain is better today.  Objective: Filed Vitals:   07/03/12 1514 07/03/12 2200 07/04/12 0539 07/04/12 0953  BP: 104/69 121/87 105/69 116/71  Pulse: 75 100 79 81  Temp: 98.1 F (36.7 C) 98.5 F (36.9 C) 98.6 F (37 C) 99 F (37.2 C)  TempSrc: Oral Oral Oral Oral  Resp: 18 17 16 16   Height:      Weight:      SpO2:  97% 99% 98% 97%    Intake/Output Summary (Last 24 hours) at 07/04/12 1315 Last data filed at 07/03/12 1700  Gross per 24 hour  Intake    360 ml  Output      0 ml  Net    360 ml   Filed Weights   06/23/12 1356 06/23/12 2145  Weight: 72.576 kg (160 lb) 66 kg (145 lb 8.1 oz)    Exam: Physical Exam: General: Awake, Oriented, No acute distress. HEENT: EOMI. Neck: Supple CV: S1 and S2 Lungs: Clear to ascultation bilaterally Abdomen: Soft, Nontender, Nondistended, +bowel sounds. Ext : No edema  Data Reviewed: Basic Metabolic Panel:  Lab 07/01/12 8295 06/29/12 0430  NA 139 --  K 4.1 --  CL 99 --  CO2 28 --  GLUCOSE 95 --  BUN 11 --  CREATININE 0.67 0.63  CALCIUM 10.0 --  MG -- --  PHOS -- --     No results found for this or any previous visit (from the past 240 hour(s)).   Studies: No results found.  Scheduled Meds:    . cefTRIAXone (ROCEPHIN)  IV  2 g Intravenous Q24H  . cyclobenzaprine  10 mg Oral TID  . heparin  5,000 Units Subcutaneous Q8H  . hepatitis A virus (PF) vaccine  1 mL Intramuscular Once  . OxyCODONE  15 mg Oral Q12H  . sodium chloride  3 mL Intravenous Q12H  . vancomycin  1,250 mg Intravenous Q8H   Continuous Infusions:    . sodium chloride 250 mL (06/30/12 1200)    Principal Problem:  *Abscess in epidural space  of cervical spine Active Problems:  Cervical discitis  Polysubstance abuse  Nicotine abuse  Intractable neck pain with left UE weakness    Meredeth Ide, MD  [name] Pager [phone]. If 7PM-7AM, please contact night-coverage at [email or web address], password [facility] 07/04/2012, 1:15 PM  LOS: 11 days

## 2012-07-04 NOTE — Progress Notes (Signed)
Clinical Social Work  CSW Pharmacist, community Nursing SNF regarding any beds available. No beds available. CSW will continue to follow.  Unk Lightning, LCSW (Weekend Coverage)

## 2012-07-05 LAB — BASIC METABOLIC PANEL
GFR calc Af Amer: >90 mL/min (ref >90)
GFR calc non Af Amer: >90 mL/min (ref >90)
Glucose, Bld: 93 mg/dL (ref 70–99)
Potassium: 4.2 mEq/L (ref 3.5–5.1)
Sodium: 132 mEq/L — ABNORMAL LOW (ref 135–145)

## 2012-07-05 NOTE — Progress Notes (Signed)
[name] PROGRESS NOTE  Christopher Berg WUJ:[id number] DOB: [birth year] DOA: 06/23/2012 PCP: Default, Provider, MD  Assessment/Plan: 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 Otelia Sergeant, ortho. Improvement with antibiotics. Currently on Vancomycin and Ceftriaxone. Dr. Ninetta Lights, ID, onboard. Discussed with interventional radiology, no amendable to a tap. Immobilization of the C-spine with soft cervical collar while seated or up with activity. PICC line. Will need long term antibiotics at least 4-6 weeks, Start oxycodone 5-10 mg q4h PRN pain, Will change the MS contin to oxycontin 15 mg Po BID. Also change Robaxin to flexeril 10 mg Po TID  Polysubstance abuse  Patient interested in cessation. Social work consulted, patient was counseled against substance abuse.   Nicotine abuse  Patient was strongly counseled against polysubstance abuse and nicotine abuse. He declined a nicotine patches.   Hypokalemia Resolved with replacement.  Hep C  Hep C genotype pending. Hep C RNA is positive. Further management as outpatient. Patient to start Hep A and B vaccines on 06/26/2012.  Prophylaxis Heparin subcutaneous.  Code Status: Full code. Family Communication: No family at bedside. Patient updated. Disposition Plan: Pending. DC to SNF when bed is available.    Consultants:  Dr. Otelia Sergeant, Ortho  Dr. Ninetta Lights, ID  Procedures:  None  Antibiotics:  Cefepime 06/23/2012 >> 06/25/2012  Vancomycin 06/23/2012 >>  Ceftriaxone 06/25/2012 >>  HPI/Subjective: Patient seen,complains of worsening pain. Pain not well controlled with oxycontin and prn oxycodone.  Objective: Filed Vitals:   07/04/12 1320 07/04/12 2208 07/05/12 0238 07/05/12 0523  BP: 123/73 122/82 104/66 107/71  Pulse: 85 107 88 79  Temp: 97.4 F (36.3 C) 98.5 F (36.9 C) 98.3 F (36.8 C) 98.1 F (36.7 C)  TempSrc: Oral Oral Oral Oral   Resp: 16 18 20 18   Height:      Weight:      SpO2: 97% 99% 96% 97%    Intake/Output Summary (Last 24 hours) at 07/05/12 1150 Last data filed at 07/05/12 0600  Gross per 24 hour  Intake 5424.83 ml  Output      0 ml  Net 5424.83 ml   Filed Weights   06/23/12 1356 06/23/12 2145  Weight: 72.576 kg (160 lb) 66 kg (145 lb 8.1 oz)    Exam: Physical Exam: General: Awake, Oriented, No acute distress. HEENT: EOMI. Neck: Supple CV: S1 and S2 Lungs: Clear to ascultation bilaterally Abdomen: Soft, Nontender, Nondistended, +bowel sounds. Ext : No edema  Data Reviewed: Basic Metabolic Panel:  Lab 07/05/12 9562 07/01/12 0433 06/29/12 0430  NA 132* 139 --  K 4.2 4.1 --  CL 94* 99 --  CO2 28 28 --  GLUCOSE 93 95 --  BUN 14 11 --  CREATININE 0.82 0.67 0.63  CALCIUM 9.8 10.0 --  MG -- -- --  PHOS -- -- --     No results found for this or any previous visit (from the past 240 hour(s)).   Studies: No results found.  Scheduled Meds:    . cefTRIAXone (ROCEPHIN)  IV  2 g Intravenous Q24H  . cyclobenzaprine  10 mg Oral TID  . heparin  5,000 Units Subcutaneous Q8H  . hepatitis A virus (PF) vaccine  1 mL Intramuscular Once  . OxyCODONE  15 mg Oral Q12H  . sodium chloride  3 mL Intravenous Q12H  . vancomycin  1,250 mg Intravenous Q8H   Continuous Infusions:    . sodium chloride 20 mL/hr (07/05/12  0300)    Principal Problem:  *Abscess in epidural space of cervical spine Active Problems:  Cervical discitis  Polysubstance abuse  Nicotine abuse  Intractable neck pain with left UE weakness    Meredeth Ide, MD  [name] Pager [phone]. If 7PM-7AM, please contact night-coverage at [email or web address], password [facility] 07/05/2012, 11:50 AM  LOS: 12 days

## 2012-07-06 MED ORDER — PHENOL 1.4 % MT LIQD
1.0000 | OROMUCOSAL | Status: DC | PRN
  Administered 2012-07-06: 1 via OROMUCOSAL
  Filled 2012-07-06: qty 177

## 2012-07-06 MED ORDER — SENNOSIDES-DOCUSATE SODIUM 8.6-50 MG PO TABS
1.0000 | ORAL_TABLET | Freq: Every day | ORAL | Status: DC
Start: 2012-07-06 — End: 2012-07-08
  Administered 2012-07-06 – 2012-07-07 (×2): 1 via ORAL
  Filled 2012-07-06 (×2): qty 1

## 2012-07-06 NOTE — Progress Notes (Signed)
ANTIBIOTIC CONSULT NOTE - FOLLOW UP  Pharmacy Consult for Vanco Indication: cervical epidural abscess with diskitis  No Known Allergies  Patient Measurements: Height: 5\' 11"  (180.3 cm) Weight: 145 lb 8.1 oz (66 kg) IBW/kg (Calculated) : 75.3    Vital Signs: Temp: 97.9 F (36.6 C) (02/03 0615) Temp src: Oral (02/03 0615) BP: 110/74 mmHg (02/03 0615) Pulse Rate: 93  (02/03 0615) Intake/Output from previous day: 02/02 0701 - 02/03 0700 In: 1080 [P.O.:1080] Out: -  Intake/Output from this shift: Total I/O In: 480 [P.O.:480] Out: -   Labs:  Basename 07/05/12 0510  WBC --  HGB --  PLT --  LABCREA --  CREATININE 0.82   Estimated Creatinine Clearance: 123 ml/min (by C-G formula based on Cr of 0.82). No results found for this basename: VANCOTROUGH:2,VANCOPEAK:2,VANCORANDOM:2,GENTTROUGH:2,GENTPEAK:2,GENTRANDOM:2,TOBRATROUGH:2,TOBRAPEAK:2,TOBRARND:2,AMIKACINPEAK:2,AMIKACINTROU:2,AMIKACIN:2, in the last 72 hours   Microbiology: Recent Results (from the past 720 hour(s))  CULTURE, BLOOD (ROUTINE X 2)     Status: Normal   Collection Time   06/23/12  1:15 PM      Component Value Range Status Comment   Specimen Description BLOOD RIGHT ARM   Final    Special Requests BOTTLES DRAWN AEROBIC AND ANAEROBIC 5CC   Final    Culture  Setup Time 06/23/2012 21:08   Final    Culture NO GROWTH 5 DAYS   Final    Report Status 06/29/2012 FINAL   Final   CULTURE, BLOOD (ROUTINE X 2)     Status: Normal   Collection Time   06/23/12  1:15 PM      Component Value Range Status Comment   Specimen Description BLOOD LEFT ARM   Final    Special Requests BOTTLES DRAWN AEROBIC ONLY 10CC   Final    Culture  Setup Time 06/23/2012 21:09   Final    Culture NO GROWTH 5 DAYS   Final    Report Status 06/29/2012 FINAL   Final   URINE CULTURE     Status: Normal   Collection Time   06/23/12 11:09 PM      Component Value Range Status Comment   Specimen Description URINE, RANDOM   Final    Special Requests  NONE   Final    Culture  Setup Time 06/23/2012 23:42   Final    Colony Count NO GROWTH   Final    Culture NO GROWTH   Final    Report Status 06/25/2012 FINAL   Final     Anti-infectives     Start     Dose/Rate Route Frequency Ordered Stop   06/26/12 0000   dextrose 5 % SOLN 50 mL with cefTRIAXone 2 G SOLR 2 g        2 g 100 mL/hr over 30 Minutes Intravenous Every 24 hours 06/26/12 1200     06/26/12 0000   sodium chloride 0.9 % SOLN 250 mL with vancomycin 10 G SOLR 1,250 mg        1,250 mg 166.7 mL/hr over 90 Minutes Intravenous Every 8 hours 06/26/12 1200     06/25/12 1200   cefTRIAXone (ROCEPHIN) 2 g in dextrose 5 % 50 mL IVPB        2 g 100 mL/hr over 30 Minutes Intravenous Every 24 hours 06/25/12 1102     06/24/12 2200   ceFEPIme (MAXIPIME) 2 g in dextrose 5 % 50 mL IVPB  Status:  Discontinued        2 g 100 mL/hr over 30 Minutes Intravenous Every 12 hours  06/24/12 1027 06/25/12 1102   06/24/12 2100   vancomycin (VANCOCIN) 1,250 mg in sodium chloride 0.9 % 250 mL IVPB        1,250 mg 166.7 mL/hr over 90 Minutes Intravenous Every 8 hours 06/24/12 1426     06/23/12 2200   vancomycin (VANCOCIN) IVPB 1000 mg/200 mL premix  Status:  Discontinued        1,000 mg 200 mL/hr over 60 Minutes Intravenous Every 8 hours 06/23/12 1933 06/24/12 1425   06/23/12 1400   ceFEPIme (MAXIPIME) 1 g in dextrose 5 % 50 mL IVPB  Status:  Discontinued        1 g 100 mL/hr over 30 Minutes Intravenous Every 12 hours 06/23/12 1238 06/24/12 1027   06/23/12 1245   vancomycin (VANCOCIN) IVPB 1000 mg/200 mL premix        1,000 mg 200 mL/hr over 60 Minutes Intravenous  Once 06/23/12 1237 06/23/12 1851   06/23/12 1245   metroNIDAZOLE (FLAGYL) IVPB 500 mg        500 mg 100 mL/hr over 60 Minutes Intravenous  Once 06/23/12 1238 06/23/12 1505          Assessment: Pt on Vanc/Rocephin (Day#14/42) for cervical epidural abscess with diskitis, 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 ID on board. Afeb, WBC wnl. For Hep A & B  vaccines, to check Hep C DNA. Hep C genotype 2.   1/23 CTX >> 1/21 Vanc >> 1/22 VT= 8.7 on 1g/8h 1/23 VT=14.3 on 1250mg /8h 1/29 VT=15.5 on 1250mg /8hr 1/21 Cefepime >> 1/23  1/21 blood x2>>neg 1/21 urine>> neg   Goal of Therapy:  Vancomycin trough level 15-20 mcg/ml  Plan:   Cont Vanc 1250 mg/8h. Recheck trough weekly (Wednesday) D/C to SNF when bed available.  Christoper Fabian, PharmD, BCPS Clinical pharmacist, pager [phone] 07/06/2012,12:58 PM

## 2012-07-06 NOTE — Progress Notes (Signed)
Patient ID: Christopher Berg, male   DOB: [birth year], 32 y.o.   MRN: [id number] Subjective:      Patient reports pain as moderate.   Complains of sore throat and mild discomfort with swallowing. Objective:   VITALS:  Temp:  [97.8 F (36.6 C)-98.7 F (37.1 C)] 97.9 F (36.6 C) (02/03 0615) Pulse Rate:  [71-93] 93  (02/03 0615) Resp:  [18-20] 20  (02/03 0615) BP: (104-124)/(68-84) 110/74 mmHg (02/03 0615) SpO2:  [98 %-99 %] 99 % (02/03 0615)  Neurologically intact ABD soft Neurovascular intact Dorsiflexion/Plantar flexion intact Neck without swelling, no masses or fluctuance.   LABS No results found for this basename: HGB:5,WBC:2,PLT:2 in the last 72 hours  Basename 07/05/12 0510  NA 132*  K 4.2  CL 94*  CO2 28  BUN 14  CREATININE 0.82  GLUCOSE 93   No results found for this basename: LABPT:2,INR:2 in the last 72 hours   Assessment/Plan:    Discitis C5-6 with epidural abscess. Neurologically normal exam no upper extremity weakness.  Advance diet Continue ABX therapy due to C5-6 discitis infection Discharge to SNF Check CRP with next lab.  Naylea Wigington E 07/06/2012, 10:30 AM

## 2012-07-06 NOTE — Plan of Care (Signed)
Problem: Discharge Progression Outcomes Goal: Complications resolved/controlled Outcome: Progressing Patient remains on rocephin and vanco. IV via PICC line .

## 2012-07-06 NOTE — Progress Notes (Signed)
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 Otelia Sergeant, ortho. Improvement with antibiotics. Currently on Vancomycin and Ceftriaxone. Dr. Ninetta Lights, ID, onboard. Discussed with interventional radiology, no amendable to a tap. Immobilization of the C-spine with soft cervical collar while seated or up with activity. PICC line. Will need long term antibiotics at least 4-6 weeks, Start oxycodone 5-10 mg q4h PRN pain, Will change the MS contin to oxycontin 15 mg Po BID. Also change Robaxin to flexeril 10 mg Po TID  Polysubstance abuse  Patient interested in cessation. Social work consulted, patient was counseled against substance abuse.   Nicotine abuse  Patient was strongly counseled against polysubstance abuse and nicotine abuse. He declined a nicotine patches.   Hypokalemia Resolved with replacement.  Hep C  Hep C genotype pending. Hep C RNA is positive. Further management as outpatient. Patient to start Hep A and B vaccines on 06/26/2012.  Sore throat Chloraseptic cherry  Prophylaxis Heparin subcutaneous.  Code Status: Full code. Family Communication: No family at bedside. Patient updated. Disposition Plan: Pending. DC to SNF when bed is available.    Consultants:  Dr. Otelia Sergeant, Ortho  Dr. Ninetta Lights, ID  Procedures:  None  Antibiotics:  Cefepime 06/23/2012 >> 06/25/2012  Vancomycin 06/23/2012 >>  Ceftriaxone 06/25/2012 >>  HPI/Subjective: Patient seen,complains of worsening pain. Pain not well controlled with oxycontin and prn oxycodone. C/O sore throat  Objective: Filed Vitals:   07/05/12 0523 07/05/12 1345 07/06/12 0406 07/06/12 0615  BP: 107/71 104/68 124/84 110/74  Pulse: 79 73 71 93  Temp: 98.1 F (36.7 C) 98.7 F (37.1 C) 97.8 F (36.6  C) 97.9 F (36.6 C)  TempSrc: Oral Oral Oral Oral  Resp: 18 18 20 20   Height:      Weight:      SpO2: 97% 98% 98% 99%    Intake/Output Summary (Last 24 hours) at 07/06/12 1210 Last data filed at 07/06/12 0900  Gross per 24 hour  Intake   1200 ml  Output      0 ml  Net   1200 ml   Filed Weights   06/23/12 1356 06/23/12 2145  Weight: 72.576 kg (160 lb) 66 kg (145 lb 8.1 oz)    Exam: Physical Exam: General: Awake, Oriented, No acute distress. HEENT: Pharyngeal mucosa is not erythematous Neck: Supple CV: S1 and S2 Lungs: Clear to ascultation bilaterally Abdomen: Soft, Nontender, Nondistended, +bowel sounds. Ext : No edema  Data Reviewed: Basic Metabolic Panel:  Lab 07/05/12 8119 07/01/12 0433  NA 132* 139  K 4.2 4.1  CL 94* 99  CO2 28 28  GLUCOSE 93 95  BUN 14 11  CREATININE 0.82 0.67  CALCIUM 9.8 10.0  MG -- --  PHOS -- --     No results found for this or any previous visit (from the past 240 hour(s)).   Studies: No results found.  Scheduled Meds:    . cefTRIAXone (ROCEPHIN)  IV  2 g Intravenous Q24H  . cyclobenzaprine  10 mg Oral TID  . heparin  5,000 Units Subcutaneous Q8H  . hepatitis A virus (PF) vaccine  1 mL Intramuscular Once  . OxyCODONE  15 mg Oral Q12H  . senna-docusate  1 tablet Oral QHS  . sodium chloride  3 mL Intravenous Q12H  . vancomycin  1,250 mg Intravenous Q8H  Continuous Infusions:    . sodium chloride 20 mL/hr (07/05/12 0300)    Principal Problem:  *Abscess in epidural space of cervical spine Active Problems:  Cervical discitis  Polysubstance abuse  Nicotine abuse  Intractable neck pain with left UE weakness    Meredeth Ide, MD  [name] Pager [phone]. If 7PM-7AM, please contact night-coverage at [email or web address], password [facility] 07/06/2012, 12:10 PM  LOS: 13 days   Him he him he him he he him he him

## 2012-07-07 LAB — C-REACTIVE PROTEIN: CRP: 2.1 mg/dL — ABNORMAL HIGH (ref <0.60)

## 2012-07-07 MED ORDER — CYCLOBENZAPRINE HCL 10 MG PO TABS: 10.0000 mg | ORAL_TABLET | Freq: Three times a day (TID) | ORAL | Status: DC

## 2012-07-07 MED ORDER — DEXTROSE 5 % IV SOLN: 2.0000 g | INTRAVENOUS | Status: AC

## 2012-07-07 MED ORDER — SENNOSIDES-DOCUSATE SODIUM 8.6-50 MG PO TABS: 1.0000 | ORAL_TABLET | Freq: Every day | ORAL | Status: DC

## 2012-07-07 MED ORDER — VANCOMYCIN HCL 10 G IV SOLR: 1250.0000 mg | Freq: Three times a day (TID) | INTRAVENOUS | Status: DC

## 2012-07-07 MED ORDER — OXYCODONE HCL 5 MG PO TABS: 5.0000 mg | ORAL_TABLET | ORAL | Status: DC | PRN

## 2012-07-07 MED ORDER — ALPRAZOLAM 0.5 MG PO TABS: 0.5000 mg | ORAL_TABLET | Freq: Three times a day (TID) | ORAL | Status: DC | PRN

## 2012-07-07 MED ORDER — OXYCODONE HCL ER 15 MG PO T12A: 15.0000 mg | EXTENDED_RELEASE_TABLET | Freq: Two times a day (BID) | ORAL | Status: DC

## 2012-07-07 NOTE — Discharge Summary (Addendum)
Physician Discharge Summary  Christopher Berg ZOX:[id number] DOB: Jun 01, 1981 DOA: 06/23/2012  PCP: Default, Provider, MD  Admit date: 06/23/2012 Discharge date: 07/07/2012  Time spent: 25 minutes  Recommendations for Outpatient Follow-up:   Picc line needs to be removed after completing antibiotics as patient as history of IV drug abuse  Please followup in ID clinic, Dr. Ninetta Lights in 2-3 weeks.  Please followup with Dr. Otelia Sergeant, ortho in 2-3 weeks.  Hep C genotype pending  Discharge Diagnoses:  Principal Problem:  *Abscess in epidural space of cervical spine Active Problems:  Cervical discitis  Polysubstance abuse  Nicotine abuse  Intractable neck pain with left UE weakness   Discharge Condition: Stable  Diet recommendation: General diet  Filed Weights   06/23/12 1356 06/23/12 2145  Weight: 72.576 kg (160 lb) 66 kg (145 lb 8.1 oz)    History of present illness:   Patient is a 32 year old male with IV drug abuse history, polysubstance abuse has been complaining of intractable back pain for the last 2 weeks. History was obtained from the patient who stated that he was only is seen at the [facility] 2 weeks ago, then again [facility] ED on 06/13/2012 and he was referred to orthopedics for neck pain. CT scan on 06/13/2012 had just shown degenerative changes of the C5-C6 and C6-C7. Patient was subsequently seen by Dr. Otelia Sergeant in his office on 06/15/2012 and outpatient MRI was ordered. MRI done on 06/22/2012 showed discitis in C5-C6 with epidural abscess and prevertebral abscess with the edema in the C5-C6 vertebrae. The results were called to Dr. Otelia Sergeant, and patient was recommended to come to ER.   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 Otelia Sergeant, ortho. Improvement with antibiotics. Currently on Vancomycin and Ceftriaxone. Dr. Ninetta Lights, ID, onboard. Discussed with  interventional radiology, no amendable to a tap. Immobilization of the C-spine with soft cervical collar while seated or up with activity. PICC line. Patient will be on Rocephin and vancomycin for 6 weeks the end date at this time is fourth of March 08/04/12. Start oxycodone 5-10 mg q4h PRN pain, OxyContin 15 mg by mouth twice a day for pain.Picc line needs to be removed after completing antibiotics as patient as history of IV drug abuse   Polysubstance abuse  Patient interested in cessation. Social work consulted, patient was counseled against substance abuse.   Nicotine abuse  Patient was strongly counseled against polysubstance abuse and nicotine abuse. He declined a nicotine patches.    Hep C Hep C genotype pending. Hep C RNA positive. Further management as outpatient. Patient to start Hep A and B vaccines on 06/26/2012.  Consultants:  Dr. Otelia Sergeant, Ortho  Dr. Ninetta Lights, ID  Procedures:  None  Antibiotics:  Cefepime 06/23/2012 >> 06/25/2012  Vancomycin 06/23/2012 >>  Ceftriaxone 06/25/2012 >>  Discharge Exam: Filed Vitals:   07/06/12 1411 07/06/12 1725 07/06/12 2200 07/07/12 0600  BP: 115/74 105/66 109/64 108/64  Pulse: 98 89 94 89  Temp: 97.7 F (36.5 C) 97.9 F (36.6 C) 99.8 F (37.7 C) 98.3 F (36.8 C)  TempSrc:   Oral Oral  Resp: 20 20 20 20   Height:      Weight:      SpO2: 98% 96% 100% 100%    Discharge Instructions      Discharge Orders    Future Appointments: Provider: Department: Dept Phone: Center:   07/13/2012 10:45 AM Ginnie Smart, MD Patrcia Dolly  [facility] [phone] [facility]     Future Orders Please Complete By Expires   Diet general      Diet - low sodium heart healthy      Increase activity slowly      Discharge instructions      Comments:   Please followup in ID clinic, Dr. Ninetta Lights in 2-3 weeks.  Please followup with Dr. Otelia Sergeant, ortho in 2-3 weeks.   Increase activity slowly          Medication List     As of  07/07/2012  5:36 PM    STOP taking these medications         HYDROcodone-acetaminophen 10-325 MG per tablet   Commonly known as: NORCO      TAKE these medications         acetaminophen 325 MG tablet   Commonly known as: TYLENOL   Take 2 tablets (650 mg total) by mouth every 6 (six) hours as needed (or Fever >/= 101).      ALPRAZolam 0.5 MG tablet   Commonly known as: XANAX   Take 1 tablet (0.5 mg total) by mouth 3 (three) times daily as needed for anxiety (q8 hrs prn).      alum & mag hydroxide-simeth 200-200-20 MG/5ML suspension   Commonly known as: MAALOX/MYLANTA   Take 30 mLs by mouth every 6 (six) hours as needed (dyspepsia).      cyclobenzaprine 10 MG tablet   Commonly known as: FLEXERIL   Take 10 mg by mouth every 8 (eight) hours as needed. For muscle spasms      cyclobenzaprine 10 MG tablet   Commonly known as: FLEXERIL   Take 1 tablet (10 mg total) by mouth 3 (three) times daily.      dextrose 5 % SOLN 50 mL with cefTRIAXone 2 G SOLR 2 g   Inject 2 g into the vein daily. For atleast 4 weeks from 07/07/12      ibuprofen 600 MG tablet   Commonly known as: ADVIL,MOTRIN   Take 600 mg by mouth every 6 (six) hours as needed. For pain.      oxyCODONE 5 MG immediate release tablet   Commonly known as: Oxy IR/ROXICODONE   Take 1-2 tablets (5-10 mg total) by mouth every 4 (four) hours as needed for pain.      oxyCODONE 5 MG immediate release tablet   Commonly known as: Oxy IR/ROXICODONE   Take 1-2 tablets (5-10 mg total) by mouth every 4 (four) hours as needed.      OxyCODONE 15 mg T12a   Commonly known as: OXYCONTIN   Take 1 tablet (15 mg total) by mouth every 12 (twelve) hours.      senna-docusate 8.6-50 MG per tablet   Commonly known as: Senokot-S   Take 1 tablet by mouth at bedtime.      sodium chloride 0.9 % SOLN 250 mL with vancomycin 10 G SOLR 1,250 mg   Inject 1,250 mg into the vein every 8 (eight) hours. For atleast 4 weeks from 07/07/12.         Follow-up  Information    Follow up with [name], MD. Schedule an appointment as soon as possible for a visit in 2 weeks.   Contact information:   [address]  [address] [place] [address] [phone]       Follow up with Kerrin Champagne, MD. Schedule an appointment as soon as possible  for a visit in 2 weeks.   Contact information:   [address] [place] [address] [phone]           The results of significant diagnostics from this hospitalization (including imaging, microbiology, ancillary and laboratory) are listed below for reference.    Significant Diagnostic Studies: Ct Cervical Spine Wo Contrast  06/13/2012  *RADIOLOGY REPORT*  Clinical Data: Chronic neck pain.  CT CERVICAL SPINE WITHOUT CONTRAST  Technique:  Multidetector CT imaging of the cervical spine was performed. Multiplanar CT image reconstructions were also generated.  Comparison: MRI 04/12/2009  Findings: Normal alignment.  Mild degenerative disc disease changes diffusely with disc space narrowing and spurring, most pronounced at C5-6 and C6-7.  The previously described left paracentral disc protrusion by MRI cannot be appreciated on CT and could be further evaluated with elective MRI if felt clinically indicated.  No fracture.  No epidural or paraspinal hematoma.  IMPRESSION: Degenerative changes.  No acute findings.   Original Report Authenticated By: Charlett Nose, M.D.    Mr Cervical Spine Wo Contrast  06/23/2012  *RADIOLOGY REPORT*  Clinical Data: Severe neck pain and stiffness.  Bilateral shoulder pain and upper extremity weakness since a lifting injury 2 weeks ago.  MRI CERVICAL SPINE WITHOUT CONTRAST  Technique:  Multiplanar and multiecho pulse sequences of the cervical spine, to include the craniocervical junction and cervicothoracic junction, were obtained according to standard protocol without intravenous contrast.  Comparison: CT scan dated 06/13/2012 and cervical MRI dated  04/12/2009  Findings: The patient has diskitis at C5-6 with an anterior epidural abscess and a prevertebral soft tissue abscess.  The posterior longitudinal ligament is elevated from the C4-5 level to the C6-7 level.  Prevertebral edema extends from C2-3 to C6-7.  There is narrowing of the C5-6 disc space with edema in the C5 and C6 vertebra.  There is a small erosion of the posterior-superior aspect of C6 and there is a 2.5 mm retrolisthesis of C5 on C6. The thecal sac is compressed by the abscess and there is slight compression of the spinal cord without myelopathy.  C1-2 through C4-5:  The discs are normal.  C6-7:  Small disc protrusion with accompanying osteophytes centrally and asymmetric into the left lateral recess, decreased in size since the prior MRI. 2 mm retrolisthesis of C6 on C7.  C7-T1 through T2-3:  Normal.  IMPRESSION: 1.  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 2.  Thecal sac and spinal cord are slightly compressed at C5-6 without myelopathy. 3.  2.5 mm retrolisthesis of C5 on C6. 3.  Edema in the C5 and C6 vertebra.  This could represent reactive edema or osteomyelitis.  There is a small focal area of erosion of the posterior-superior aspect  of C6 in the midline but the margin is sclerotic suggesting this is chronic.           Emergent results were called by phone at the time of interpretation on 06/23/2012 at 9:25 a.m. to Dr. Otelia Sergeant,, who verbally acknowledged the results.   Original Report Authenticated By: Francene Boyers, M.D.     Microbiology: No results found for this or any previous visit (from the past 240 hour(s)).   Labs: Basic Metabolic Panel:  Lab 07/05/12 6962 07/01/12 0433  NA 132* 139  K 4.2 4.1  CL 94* 99  CO2 28 28  GLUCOSE 93 95  BUN 14 11  CREATININE 0.82 0.67  CALCIUM 9.8 10.0  MG -- --  PHOS -- --  Liver Function Tests: No results found for this basename: AST:5,ALT:5,ALKPHOS:5,BILITOT:5,PROT:5,ALBUMIN:5 in the last  168 hours No results found for this basename: LIPASE:5,AMYLASE:5 in the last 168 hours No results found for this basename: AMMONIA:5 in the last 168 hours CBC: No results found for this basename: WBC:5,NEUTROABS:5,HGB:5,HCT:5,MCV:5,PLT:5 in the last 168 hours Cardiac Enzymes: No results found for this basename: CKTOTAL:5,CKMB:5,CKMBINDEX:5,TROPONINI:5 in the last 168 hours BNP: BNP (last 3 results) No results found for this basename: PROBNP:3 in the last 8760 hours CBG: No results found for this basename: GLUCAP:5 in the last 168 hours     Signed:  Crescentia Boutwell S  [name] 07/07/2012, 5:36 PM

## 2012-07-07 NOTE — Clinical Social Work Note (Signed)
Clinical Social Work   CSW was notified by [employer] coordinator that bed will be available tomorrow, 07/08/2012. CSW updated MD. CSW will facilitate discharge to [facility] in AM.   Dede Query, MSW, [place] [phone]

## 2012-07-08 ENCOUNTER — Inpatient Hospital Stay
Admission: RE | Admit: 2012-07-08 | Discharge: 2012-08-08 | Disposition: A | Discharge status: Other Acute Inpatient Hospital | Payer: PRIVATE HEALTH INSURANCE | Source: Ambulatory Visit | Attending: Internal Medicine | Admitting: Internal Medicine

## 2012-07-08 DIAGNOSIS — G062 Extradural and subdural abscess, unspecified: Principal | ICD-10-CM

## 2012-07-08 LAB — VANCOMYCIN, TROUGH: Vancomycin Tr: 13.6 ug/mL (ref 10.0–20.0)

## 2012-07-08 MED ORDER — OXYCODONE HCL 5 MG PO TABS: 5.0000 mg | ORAL_TABLET | ORAL | Status: DC | PRN

## 2012-07-08 MED ORDER — VANCOMYCIN HCL 10 G IV SOLR: 1750.0000 mg | Freq: Three times a day (TID) | INTRAVENOUS | Status: AC

## 2012-07-08 MED ORDER — ALPRAZOLAM 0.5 MG PO TABS: 0.5000 mg | ORAL_TABLET | Freq: Three times a day (TID) | ORAL | Status: DC | PRN

## 2012-07-08 MED ORDER — OXYCODONE HCL ER 15 MG PO T12A: 15.0000 mg | EXTENDED_RELEASE_TABLET | Freq: Two times a day (BID) | ORAL | Status: DC

## 2012-07-08 MED ORDER — VANCOMYCIN HCL 10 G IV SOLR
1750.0000 mg | Freq: Three times a day (TID) | INTRAVENOUS | Status: DC
  Administered 2012-07-08: 1750 mg via INTRAVENOUS
  Filled 2012-07-08 (×4): qty 1750

## 2012-07-08 NOTE — Progress Notes (Signed)
[name] PROGRESS NOTE  Christopher Berg JYN:[id number] DOB: 01/11/1981 DOA: 06/23/2012 PCP: Default, Provider, MD   No change in discharge summary from 07/07/2012  Assessment/Plan: Principal Problem:  *Abscess in epidural space of cervical spine Active Problems:  Cervical discitis  Polysubstance abuse  Nicotine abuse  Intractable neck pain with left UE weakness    Discharge Medication List   As of 07/08/2012  7:41 AM  STOP taking these medications        HYDROcodone-acetaminophen 10-325 MG per tablet  Commonly known as: NORCO   TAKE these medications        acetaminophen 325 MG tablet  Commonly known as: TYLENOL  Take 2 tablets (650 mg total) by mouth every 6 (six) hours as needed (or Fever >/= 101).     ALPRAZolam 0.5 MG tablet  Commonly known as: XANAX  Take 1 tablet (0.5 mg total) by mouth 3 (three) times daily as needed for anxiety (q8 hrs prn).     alum & mag hydroxide-simeth 200-200-20 MG/5ML suspension  Commonly known as: MAALOX/MYLANTA  Take 30 mLs by mouth every 6 (six) hours as needed (dyspepsia).     cyclobenzaprine 10 MG tablet  Commonly known as: FLEXERIL  Take 1 tablet (10 mg total) by mouth 3 (three) times daily.     dextrose 5 % SOLN 50 mL with cefTRIAXone 2 G SOLR 2 g  Inject 2 g into the vein daily. For atleast 4 weeks from 07/07/12     ibuprofen 600 MG tablet  Commonly known as: ADVIL,MOTRIN  Take 600 mg by mouth every 6 (six) hours as needed. For pain.     oxyCODONE 5 MG immediate release tablet  Commonly known as: Oxy IR/ROXICODONE  Take 1-2 tablets (5-10 mg total) by mouth every 4 (four) hours as needed.     OxyCODONE 15 mg T12a  Commonly known as: OXYCONTIN  Take 1 tablet (15 mg total) by mouth every 12 (twelve) hours.     senna-docusate 8.6-50 MG per tablet  Commonly known as: Senokot-S  Take 1 tablet by mouth at bedtime.       sodium chloride 0.9 % SOLN 500 mL with vancomycin 10 G SOLR 1,750 mg  Inject 1,750 mg  into the vein 3 (three) times daily.          Followup Recommendations for Outpatient Follow-up:  Picc line needs to be removed after completing antibiotics as patient as history of IV drug abuse  Please followup in ID clinic, Dr. Ninetta Lights in 2-3 weeks.  Please followup with Dr. Otelia Sergeant, ortho in 2-3 weeks.  Hep C genotype pending   History of present illness:  Patient is a 32 year old male with IV drug abuse history, polysubstance abuse has been complaining of intractable back pain for the last 2 weeks. History was obtained from the patient who stated that he was only is seen at the [facility] 2 weeks ago, then again [facility] ED on 06/13/2012 and he was referred to orthopedics for neck pain. CT scan on 06/13/2012 had just shown degenerative changes of the C5-C6 and C6-C7. Patient was subsequently seen by Dr. Otelia Sergeant in his office on 06/15/2012 and outpatient MRI was ordered. MRI done on 06/22/2012 showed discitis in C5-C6 with epidural abscess and prevertebral abscess with the edema in the C5-C6 vertebrae. The results were called to Dr. Otelia Sergeant, and patient was recommended to come to ER.  Hospital Course:  Abscess in epidural space of cervical spine with Cervical discitis, Intractable neck pain with left  UE weakness  Continue management as per Dr. Otelia Sergeant, ortho. Improvement with antibiotics. Currently on Vancomycin and Ceftriaxone. Dr. Ninetta Lights, ID, onboard. Discussed with interventional radiology, no amendable to a tap. Immobilization of the C-spine with soft cervical collar while seated or up with activity. PICC line. Patient will be on Rocephin and vancomycin for 6 weeks the end date at this time is fourth of March 08/04/12. Start oxycodone 5-10 mg q4h PRN pain, OxyContin 15 mg by mouth twice a day for pain.Picc line needs to be removed after completing antibiotics as patient as history of IV drug abuse    Polysubstance abuse  Patient interested in cessation. Social work consulted, patient was  counseled against substance abuse.  Nicotine abuse  Patient was strongly counseled against polysubstance abuse and nicotine abuse. He declined a nicotine patches.  Hep C  Hep C genotype pending. Hep C RNA positive. Further management as outpatient. Patient to start Hep A and B vaccines on 06/26/2012.  Consultants:  Dr. Otelia Sergeant, Ortho  Dr. Ninetta Lights, ID Procedures:  None Antibiotics:  Cefepime 06/23/2012 >> 06/25/2012  Vancomycin 06/23/2012 >> Ceftriaxone 06/25/2012 >>   HPI/Subjective: No complaints overnight, stable for discharge  Objective: Filed Vitals:   07/07/12 1400 07/07/12 2113 07/08/12 0144 07/08/12 0551  BP: 107/73 119/75 108/71 94/64  Pulse: 85 117 88 80  Temp: 98 F (36.7 C) 99.1 F (37.3 C) 98.4 F (36.9 C) 98.3 F (36.8 C)  TempSrc: Oral Oral Oral Oral  Resp: 18 20 20 20   Height:      Weight:      SpO2: 100% 99% 98% 98%    Intake/Output Summary (Last 24 hours) at 07/08/12 0737 Last data filed at 07/07/12 1610  Gross per 24 hour  Intake      0 ml  Output      0 ml  Net      0 ml    Exam:  HENT:  Head: Atraumatic.  Nose: Nose normal.  Mouth/Throat: Oropharynx is clear and moist.  Eyes: Conjunctivae are normal. Pupils are equal, round, and reactive to light. No scleral icterus.  Neck: Neck supple. No tracheal deviation present.  Cardiovascular: Normal rate, regular rhythm, normal heart sounds and intact distal pulses.  Pulmonary/Chest: Effort normal and breath sounds normal. No respiratory distress.  Abdominal: Soft. Normal appearance and bowel sounds are normal. She exhibits no distension. There is no tenderness.  Musculoskeletal: She exhibits no edema and no tenderness.  Neurological: She is alert. No cranial nerve deficit.    Data Reviewed: Basic Metabolic Panel:  Lab 07/05/12 9604  NA 132*  K 4.2  CL 94*  CO2 28  GLUCOSE 93  BUN 14  CREATININE 0.82  CALCIUM 9.8  MG --  PHOS --    Liver Function Tests: No results found for this  basename: AST:5,ALT:5,ALKPHOS:5,BILITOT:5,PROT:5,ALBUMIN:5 in the last 168 hours No results found for this basename: LIPASE:5,AMYLASE:5 in the last 168 hours No results found for this basename: AMMONIA:5 in the last 168 hours  CBC: No results found for this basename: WBC:5,NEUTROABS:5,HGB:5,HCT:5,MCV:5,PLT:5 in the last 168 hours  Cardiac Enzymes: No results found for this basename: CKTOTAL:5,CKMB:5,CKMBINDEX:5,TROPONINI:5 in the last 168 hours BNP (last 3 results) No results found for this basename: PROBNP:3 in the last 8760 hours   CBG: No results found for this basename: GLUCAP:5 in the last 168 hours  No results found for this or any previous visit (from the past 240 hour(s)).   Studies: Ct Cervical Spine Wo Contrast  06/13/2012  *RADIOLOGY  REPORT*  Clinical Data: Chronic neck pain.  CT CERVICAL SPINE WITHOUT CONTRAST  Technique:  Multidetector CT imaging of the cervical spine was performed. Multiplanar CT image reconstructions were also generated.  Comparison: MRI 04/12/2009  Findings: Normal alignment.  Mild degenerative disc disease changes diffusely with disc space narrowing and spurring, most pronounced at C5-6 and C6-7.  The previously described left paracentral disc protrusion by MRI cannot be appreciated on CT and could be further evaluated with elective MRI if felt clinically indicated.  No fracture.  No epidural or paraspinal hematoma.  IMPRESSION: Degenerative changes.  No acute findings.   Original Report Authenticated By: Charlett Nose, M.D.    Mr Cervical Spine Wo Contrast  06/23/2012  *RADIOLOGY REPORT*  Clinical Data: Severe neck pain and stiffness.  Bilateral shoulder pain and upper extremity weakness since a lifting injury 2 weeks ago.  MRI CERVICAL SPINE WITHOUT CONTRAST  Technique:  Multiplanar and multiecho pulse sequences of the cervical spine, to include the craniocervical junction and cervicothoracic junction, were obtained according to standard protocol without  intravenous contrast.  Comparison: CT scan dated 06/13/2012 and cervical MRI dated 04/12/2009  Findings: The patient has diskitis at C5-6 with an anterior epidural abscess and a prevertebral soft tissue abscess.  The posterior longitudinal ligament is elevated from the C4-5 level to the C6-7 level.  Prevertebral edema extends from C2-3 to C6-7.  There is narrowing of the C5-6 disc space with edema in the C5 and C6 vertebra.  There is a small erosion of the posterior-superior aspect of C6 and there is a 2.5 mm retrolisthesis of C5 on C6. The thecal sac is compressed by the abscess and there is slight compression of the spinal cord without myelopathy.  C1-2 through C4-5:  The discs are normal.  C6-7:  Small disc protrusion with accompanying osteophytes centrally and asymmetric into the left lateral recess, decreased in size since the prior MRI. 2 mm retrolisthesis of C6 on C7.  C7-T1 through T2-3:  Normal.  IMPRESSION: 1.  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 2.  Thecal sac and spinal cord are slightly compressed at C5-6 without myelopathy. 3.  2.5 mm retrolisthesis of C5 on C6. 3.  Edema in the C5 and C6 vertebra.  This could represent reactive edema or osteomyelitis.  There is a small focal area of erosion of the posterior-superior aspect  of C6 in the midline but the margin is sclerotic suggesting this is chronic.           Emergent results were called by phone at the time of interpretation on 06/23/2012 at 9:25 a.m. to Dr. Otelia Sergeant,, who verbally acknowledged the results.   Original Report Authenticated By: Francene Boyers, M.D.     Scheduled Meds:   . cefTRIAXone (ROCEPHIN)  IV  2 g Intravenous Q24H  . cyclobenzaprine  10 mg Oral TID  . heparin  5,000 Units Subcutaneous Q8H  . hepatitis A virus (PF) vaccine  1 mL Intramuscular Once  . OxyCODONE  15 mg Oral Q12H  . senna-docusate  1 tablet Oral QHS  . sodium chloride  3 mL Intravenous Q12H  . vancomycin  1,750 mg  Intravenous Q8H   Continuous Infusions:   . sodium chloride 20 mL/hr (07/05/12 0300)    Principal Problem:  *Abscess in epidural space of cervical spine Active Problems:  Cervical discitis  Polysubstance abuse  Nicotine abuse  Intractable neck pain with left UE weakness    Time spent: 40 minutes  [facility]  [name] Pager [phone]. If 8PM-8AM, please contact night-coverage at [email or web address], password [facility] 07/08/2012, 7:37 AM  LOS: 15 days

## 2012-07-08 NOTE — Progress Notes (Signed)
Pt went of the floor to smoke without permission, he walked straight through the exit doors, pt stated he just needed to smoke when staff went and found him outside. --Areebah Meinders., rn

## 2012-07-08 NOTE — Progress Notes (Signed)
ANTIBIOTIC CONSULT NOTE - FOLLOW UP  Pharmacy Consult for vancomycin Indication: cervical epidural abscess with diskitis  Labs: Estimated Creatinine Clearance: 123 ml/min (by C-G formula based on Cr of 0.82).  Basename 07/08/12 0419  VANCOTROUGH 13.6  VANCOPEAK --  VANCORANDOM --  GENTTROUGH --  GENTPEAK --  GENTRANDOM --  TOBRATROUGH --  TOBRAPEAK --  TOBRARND --  AMIKACINPEAK --  AMIKACINTROU --  AMIKACIN --     Assessment: 32yo male now subtherapeutic on vancomycin after one trough at goal with current dose.  Lab drawn 7hr after last dose, true trough likely slightly lower.  Goal of Therapy:  Vancomycin trough level 15-20 mcg/ml  Plan:  Will increase vancomycin to 1750mg  IV Q8H for calculated trough of ~18 and continue to monitor.  Colleen Can PharmD BCPS 07/08/2012,5:38 AM

## 2012-07-08 NOTE — Progress Notes (Signed)
Pt has had all his antibiotics and has been d/ced to [facility]------Doroteo Nickolson, rn

## 2012-07-08 NOTE — Clinical Social Work Note (Signed)
Clinical Social Work   Pt is ready for discharge today to [facility]. Facility has received discharge summary and is ready to  admit pt. Pt is agreeable to discharge plan. Pt will be transported by [facility]. CSW is signing off as no further needs identified.   Dede Query, MSW, Theresia Majors [phone]

## 2012-07-09 NOTE — Care Management Note (Signed)
    Page 1 of 1   07/09/2012     8:02:03 AM   CARE MANAGEMENT NOTE 07/09/2012  Patient:  AMIER, HOYT   Account Number:  [id number]  Date Initiated:  06/30/2012  Documentation initiated by:  [facility]  Subjective/Objective Assessment:   admitted with epidural abscess     Action/Plan:   Iv antibiotics for 4-6wks at SNF secondary to substance abuse hx   Anticipated DC Date:  07/01/2012   Anticipated DC Plan:  SKILLED NURSING FACILITY  In-house referral  Clinical Social Worker      DC Planning Services  CM consult      Choice offered to / List presented to:             Status of service:  Completed, signed off Medicare Important Message given?   (If response is "NO", the following Medicare IM given date fields will be blank) Date Medicare IM given:   Date Additional Medicare IM given:    Discharge Disposition:  SKILLED NURSING FACILITY  Per UR Regulation:  Reviewed for med. necessity/level of care/duration of stay  If discussed at Long Length of Stay Meetings, dates discussed:   06/30/2012    Comments:

## 2012-07-13 ENCOUNTER — Inpatient Hospital Stay: Payer: PRIVATE HEALTH INSURANCE | Admitting: Infectious Diseases

## 2012-07-28 ENCOUNTER — Encounter (HOSPITAL_COMMUNITY): Payer: Self-pay

## 2012-07-28 ENCOUNTER — Telehealth: Payer: Self-pay | Admitting: Infectious Disease

## 2012-07-28 ENCOUNTER — Ambulatory Visit (HOSPITAL_COMMUNITY)
Admit: 2012-07-28 | Discharge: 2012-07-28 | Disposition: A | Discharge status: Home / Self Care | Payer: PRIVATE HEALTH INSURANCE | Source: Ambulatory Visit | Attending: Specialist | Admitting: Specialist

## 2012-07-28 ENCOUNTER — Ambulatory Visit (INDEPENDENT_AMBULATORY_CARE_PROVIDER_SITE_OTHER): Payer: PRIVATE HEALTH INSURANCE | Admitting: Infectious Diseases

## 2012-07-28 ENCOUNTER — Encounter: Payer: Self-pay | Admitting: Infectious Diseases

## 2012-07-28 VITALS — BP 123/73 | HR 98 | Temp 98.3°F | Ht 70.0 in | Wt 150.0 lb

## 2012-07-28 DIAGNOSIS — M5090 Cervical disc disorder, unspecified, unspecified cervical region: Secondary | ICD-10-CM | POA: Insufficient documentation

## 2012-07-28 DIAGNOSIS — M869 Osteomyelitis, unspecified: Secondary | ICD-10-CM | POA: Insufficient documentation

## 2012-07-28 DIAGNOSIS — M542 Cervicalgia: Secondary | ICD-10-CM | POA: Insufficient documentation

## 2012-07-28 DIAGNOSIS — G061 Intraspinal abscess and granuloma: Secondary | ICD-10-CM

## 2012-07-28 NOTE — Telephone Encounter (Signed)
Received phone call from [facility] asking for verbal order to continue current abx in accordance with plan by Dr. Ninetta Lights. Order given to April at 6pm.

## 2012-07-28 NOTE — Assessment & Plan Note (Signed)
His MRI is not encouraging. I discussed this with him. Will plan to continue his anbx an additional 6 weeks. He will f/u with Dr Otelia Sergeant as soon as we can get appt. Will see him back in 4 weeks, repeat MRI at that time.

## 2012-07-28 NOTE — Progress Notes (Signed)
  Subjective:    Patient ID: Christopher Berg, male    DOB: Oct 04, 1980, 32 y.o.   MRN: [id number]  HPI 32 yo M with hx of polysubstance abuse admitted 06-23-12 with severe neck pain. He was found on MRI to have: 1.  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 2.  Thecal sac and spinal cord are slightly compressed at C5-6 without myelopathy. 3.  2.5 mm retrolisthesis of C5 on C6. 3.  Edema in the C5 and C6 vertebra.  This could represent reactive edema or osteomyelitis.  There is a small focal area of erosion of the posterior-superior aspect  of C6 in the midline but the margin is sclerotic suggesting this is chronic.   He was eval by ortho and IR and was not felt to be a candidate for drainage. He was started on vanco/ceftriaxone and d/c to SNF on 2-6.  His course was also notable for new finding of Hep C + (type 2).   Today is day 36 of anbx.   Still has some neck pain (mostly in AM), nagging. Can feel himself getting better- decreased pain, ROM (some limitation with looking straight up), strength. No paresthesias in hands/feet. No problems with PIC. Nurse wasn't able to draw blood from line yesterday.   Review of Systems  Constitutional: Negative for fever and chills.  Gastrointestinal: Negative for diarrhea and constipation.  Genitourinary: Negative for dysuria.       Hesitancy  Musculoskeletal: Positive for arthralgias.       Objective:   Physical Exam  Constitutional: He appears well-developed and well-nourished.  HENT:  Mouth/Throat: No oropharyngeal exudate.  Eyes: EOM are normal. Pupils are equal, round, and reactive to light.  Neck: Neck supple.  Cardiovascular: Normal rate, regular rhythm and normal heart sounds.   Pulmonary/Chest: Effort normal and breath sounds normal.  Abdominal: Soft. Bowel sounds are normal. He exhibits no distension.  Musculoskeletal:       Back:  PIC is in RUE, non-tender, no cordis.   Lymphadenopathy:    He has no  cervical adenopathy.  Neurological: He is alert. He has normal strength. He is not disoriented. No cranial nerve deficit or sensory deficit. Coordination normal.          Assessment & Plan:

## 2012-08-06 ENCOUNTER — Other Ambulatory Visit (HOSPITAL_COMMUNITY): Payer: Self-pay | Admitting: Specialist

## 2012-08-06 ENCOUNTER — Encounter (HOSPITAL_COMMUNITY): Payer: Self-pay

## 2012-08-07 NOTE — H&P (Signed)
Christopher Berg is an 32 y.o. male.   Chief Complaint:Worsening epidural and prevertebral abscess C5-6.  HPI:32 year old male presented to [facility] 1/11 referred by [facility] for follow up of neck Pain. Had been seen in [place] and [facility] 3 times. Complaining of neck pain and left upper extremity Weakness. Exam was non focal with diffuse left upper extremity weakness and severe guarding of cervical Movement. MRi showed C5-6 Discitis with epidural abscess. He was admitted 06/23/2012 when the results of The MRI were available. ID consult and he was placed on IV antibiotics. Left arm weakness improved over the Next several days and has remained stable and normal. PICCU line was placed. Due to history of multisubstance Abuse he was discharged to [facility] in [place] to continue IV antibiotic treatment. Underwent follow up MRI 07/28/2012 which shows a increased size of prevertebral abscess now C3 to C6 with increasing kyphosis At C5-6. Persistant epidural abscess at C5-6. Due to worsening size of the prevertebral abscess and concern Of worsening Kyphosis at the C5-6 level I have recommended open Incision and Drainage and Debridement of The prevertebral abscess and biopsy for definitive organism. Plan to perform discectomy C5-6 with partial vertebrectomy and decompression with iliac crest bone graft harvest to restore lordosis at C5-6. Antibiotics were discontinued 08/06/2012 in Anticipation of biopsy to try to improve chances of growing the organism. NO ANTIBIOTICS SHOULD BE GIVEN AS A BIOPSY IS BEING DONE TO GROWN THE INFECTING ORGANISM.  Past Medical History  Diagnosis Date  . Hepatitis C infection   . Depression   . Anxiety   . Asthma     "as a child"  . Kidney stone     hx of  . Urinary tract infection     hx of  . Headache     "occas"  . Chronic neck pain     Past Surgical History  Procedure Laterality Date  . No past surgeries      History reviewed. No pertinent family  history. Social History:  reports that he has been smoking Cigarettes.  He has been smoking about 0.50 packs per day. He has never used smokeless tobacco. He reports that  drinks alcohol. He reports that he uses illicit drugs.  Allergies:  Allergies  Allergen Reactions  . Ace Inhibitors     Medications Prior to Admission  Medication Sig Dispense Refill  . acetaminophen (TYLENOL) 325 MG tablet Take 2 tablets (650 mg total) by mouth every 6 (six) hours as needed (or Fever >/= 101).      Marland Kitchen ALPRAZolam (XANAX) 0.5 MG tablet Take 1 tablet (0.5 mg total) by mouth 3 (three) times daily as needed for anxiety (q8 hrs prn).      Marland Kitchen alum & mag hydroxide-simeth (MAALOX/MYLANTA) 200-200-20 MG/5ML suspension Take 30 mLs by mouth every 6 (six) hours as needed (dyspepsia).  355 mL    . cyclobenzaprine (FLEXERIL) 10 MG tablet Take 1 tablet (10 mg total) by mouth 3 (three) times daily.  30 tablet    . ibuprofen (ADVIL,MOTRIN) 600 MG tablet Take 600 mg by mouth every 6 (six) hours as needed. For pain.      Marland Kitchen oxyCODONE (OXY IR/ROXICODONE) 5 MG immediate release tablet Take 1-2 tablets (5-10 mg total) by mouth every 4 (four) hours as needed.  30 tablet  0  . OxyCODONE (OXYCONTIN) 15 mg T12A Take 1 tablet (15 mg total) by mouth every 12 (twelve) hours.  30 tablet  0  . senna-docusate (SENOKOT-S) 8.6-50  MG per tablet Take 1 tablet by mouth at bedtime.      . sertraline (ZOLOFT) 100 MG tablet Take 100 mg by mouth daily.        No results found for this or any previous visit (from the past 48 hour(s)). Dg Cervical Spine 2 Or 3 Views  08/08/2012  *RADIOLOGY REPORT*  Clinical Data: C5-C6 graft.  DG C-ARM 1-60 MIN,CERVICAL SPINE - 2-3 VIEW  Comparison:  MRI of 07/28/2012  Findings: 3 intraoperative views.  The first demonstrates a surgical device projecting over the C5-C6 interspace.  The second image is a lateral which images through the top of C7.  The third image is an attempted frontal.  Extensive artifact degradation.   Nondiagnostic.  IMPRESSION: Intraoperative imaging.   Original Report Authenticated By: Jeronimo Greaves, M.D.    Dg C-arm 1-60 Min  08/08/2012  *RADIOLOGY REPORT*  Clinical Data: C5-C6 graft.  DG C-ARM 1-60 MIN,CERVICAL SPINE - 2-3 VIEW  Comparison:  MRI of 07/28/2012  Findings: 3 intraoperative views.  The first demonstrates a surgical device projecting over the C5-C6 interspace.  The second image is a lateral which images through the top of C7.  The third image is an attempted frontal.  Extensive artifact degradation.  Nondiagnostic.  IMPRESSION: Intraoperative imaging.   Original Report Authenticated By: Jeronimo Greaves, M.D.     Review of Systems  Constitutional: Positive for weight loss. Negative for fever, chills, malaise/fatigue and diaphoresis.  HENT: Positive for sore throat and neck pain. Negative for hearing loss, ear pain, nosebleeds, congestion, tinnitus and ear discharge.   Eyes: Negative for blurred vision, double vision, photophobia, pain, discharge and redness.  Respiratory: Negative.  Negative for cough, hemoptysis, sputum production, shortness of breath, wheezing and stridor.   Cardiovascular: Negative.  Negative for chest pain, palpitations, orthopnea, claudication, leg swelling and PND.  Gastrointestinal: Negative for heartburn, nausea, vomiting, abdominal pain, diarrhea, constipation, blood in stool and melena.  Genitourinary: Negative.  Negative for dysuria, urgency, frequency, hematuria and flank pain.  Musculoskeletal: Positive for back pain. Negative for myalgias, joint pain and falls.  Skin: Negative.  Negative for itching and rash.  Neurological: Negative for dizziness, tingling, tremors, sensory change, speech change, focal weakness, seizures, loss of consciousness, weakness and headaches.  Endo/Heme/Allergies: Negative for environmental allergies and polydipsia. Does not bruise/bleed easily.  Psychiatric/Behavioral: Negative for depression, suicidal ideas, hallucinations,  memory loss and substance abuse. The patient is not nervous/anxious and does not have insomnia.     Blood pressure 119/78, pulse 95, temperature 97.6 F (36.4 C), temperature source Oral, resp. rate 18, SpO2 98.00%. Physical Exam  Constitutional: He is oriented to person, place, and time. He appears well-developed and well-nourished. No distress.  HENT:  Head: Normocephalic and atraumatic.  Right Ear: External ear normal.  Left Ear: External ear normal.  Nose: Nose normal.  Mouth/Throat: Oropharyngeal exudate present.  Eyes: Conjunctivae and EOM are normal. Pupils are equal, round, and reactive to light. Right eye exhibits no discharge. Left eye exhibits no discharge. No scleral icterus.  Neck: No JVD present. No tracheal deviation present. No thyromegaly present.  Cardiovascular: Normal rate, regular rhythm and intact distal pulses.  Exam reveals no gallop and no friction rub.   No murmur heard. Respiratory: Effort normal and breath sounds normal. No stridor. No respiratory distress. He has no wheezes. He has no rales. He exhibits no tenderness.  GI: Soft. Bowel sounds are normal. He exhibits no distension and no mass. There is no tenderness. There is  no rebound and no guarding.  Musculoskeletal: He exhibits edema and tenderness.  Lymphadenopathy:    He has cervical adenopathy.  Neurological: He is alert and oriented to person, place, and time. He has normal reflexes. He displays normal reflexes. No cranial nerve deficit. He exhibits normal muscle tone. Coordination normal.  Skin: Skin is warm and dry. No rash noted. He is not diaphoretic. No erythema. No pallor.   Ortho Eval: Decreased ROM C-Spine by 50% all directions. Upper extremity motor is normal including shoulder shrug, shoulder abduction internal and external rotation, biceps, triceps, wrist dorsiflexion And wrist volar flexion strength. Finger extention and flexion strength and intrinsic motor strength is normal. No long track  findings. No clonus or spasticity. No ataxia. Xray with 15 degree kyphosis at the C5-6 level with disc space loss at C5-6. Anterior prevertebral soft tissue swelling is evident and greater than with previous xrays.  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  Plan: OR for Incision and Drainage and Debridement of Prevertebral abscess and discectomy C5-6 with partial vertebrectomy To decompress epidural abscess with left iliac crest bone graft, to improve and restore lordosis C5-6.  [name],[name] 08/08/2012, 3:49 PM

## 2012-08-08 ENCOUNTER — Encounter (HOSPITAL_COMMUNITY)
Admission: RE | Disposition: A | Discharge status: Home / Self Care | Payer: Self-pay | Source: Ambulatory Visit | Attending: Specialist

## 2012-08-08 ENCOUNTER — Encounter (HOSPITAL_COMMUNITY): Payer: Self-pay | Admitting: Anesthesiology

## 2012-08-08 ENCOUNTER — Encounter (HOSPITAL_COMMUNITY): Payer: Self-pay | Admitting: Specialist

## 2012-08-08 ENCOUNTER — Inpatient Hospital Stay (HOSPITAL_COMMUNITY)
Admission: RE | Admit: 2012-08-08 | Discharge: 2012-08-12 | DRG: 029 | Disposition: A | Discharge status: Home / Self Care | Payer: PRIVATE HEALTH INSURANCE | Source: Ambulatory Visit | Attending: Specialist | Admitting: Specialist

## 2012-08-08 ENCOUNTER — Non-Acute Institutional Stay: Payer: Self-pay | Admitting: *Deleted

## 2012-08-08 ENCOUNTER — Ambulatory Visit (HOSPITAL_COMMUNITY): Payer: PRIVATE HEALTH INSURANCE

## 2012-08-08 DIAGNOSIS — B1920 Unspecified viral hepatitis C without hepatic coma: Secondary | ICD-10-CM | POA: Diagnosis present

## 2012-08-08 DIAGNOSIS — M4642 Discitis, unspecified, cervical region: Secondary | ICD-10-CM

## 2012-08-08 DIAGNOSIS — M5090 Cervical disc disorder, unspecified, unspecified cervical region: Secondary | ICD-10-CM | POA: Diagnosis present

## 2012-08-08 DIAGNOSIS — G061 Intraspinal abscess and granuloma: Principal | ICD-10-CM | POA: Diagnosis present

## 2012-08-08 DIAGNOSIS — M869 Osteomyelitis, unspecified: Secondary | ICD-10-CM | POA: Diagnosis present

## 2012-08-08 DIAGNOSIS — J45909 Unspecified asthma, uncomplicated: Secondary | ICD-10-CM | POA: Diagnosis present

## 2012-08-08 DIAGNOSIS — F3289 Other specified depressive episodes: Secondary | ICD-10-CM | POA: Diagnosis present

## 2012-08-08 DIAGNOSIS — F411 Generalized anxiety disorder: Secondary | ICD-10-CM | POA: Diagnosis present

## 2012-08-08 DIAGNOSIS — Z87442 Personal history of urinary calculi: Secondary | ICD-10-CM

## 2012-08-08 DIAGNOSIS — Z79899 Other long term (current) drug therapy: Secondary | ICD-10-CM

## 2012-08-08 DIAGNOSIS — F17200 Nicotine dependence, unspecified, uncomplicated: Secondary | ICD-10-CM | POA: Diagnosis present

## 2012-08-08 DIAGNOSIS — B965 Pseudomonas (aeruginosa) (mallei) (pseudomallei) as the cause of diseases classified elsewhere: Secondary | ICD-10-CM | POA: Diagnosis present

## 2012-08-08 HISTORY — PX: ANTERIOR CERVICAL DECOMP/DISCECTOMY FUSION: SHX1161

## 2012-08-08 HISTORY — DX: Urinary tract infection, site not specified: N39.0

## 2012-08-08 HISTORY — DX: Calculus of kidney: N20.0

## 2012-08-08 HISTORY — DX: Unspecified asthma, uncomplicated: J45.909

## 2012-08-08 HISTORY — DX: Major depressive disorder, single episode, unspecified: F32.9

## 2012-08-08 HISTORY — DX: Headache: R51

## 2012-08-08 HISTORY — DX: Other chronic pain: G89.29

## 2012-08-08 HISTORY — DX: Depression, unspecified: F32.A

## 2012-08-08 HISTORY — DX: Cervicalgia: M54.2

## 2012-08-08 HISTORY — DX: Anxiety disorder, unspecified: F41.9

## 2012-08-08 LAB — CBC WITH DIFFERENTIAL/PLATELET
Basophils Absolute: 0 10*3/uL (ref 0.0–0.1)
Eosinophils Relative: 6 % — ABNORMAL HIGH (ref 0–5)
HCT: 42.9 % (ref 39.0–52.0)
Hemoglobin: 15.4 g/dL (ref 13.0–17.0)
Lymphocytes Relative: 52 % — ABNORMAL HIGH (ref 12–46)
MCV: 85.6 fL (ref 78.0–100.0)
Monocytes Absolute: 0.4 10*3/uL (ref 0.1–1.0)
Monocytes Relative: 8 % (ref 3–12)
Neutro Abs: 1.6 10*3/uL — ABNORMAL LOW (ref 1.7–7.7)
RDW: 12.6 % (ref 11.5–15.5)
WBC: 4.9 10*3/uL (ref 4.0–10.5)

## 2012-08-08 LAB — COMPREHENSIVE METABOLIC PANEL
BUN: 18 mg/dL (ref 6–23)
CO2: 27 mEq/L (ref 19–32)
Calcium: 10.1 mg/dL (ref 8.4–10.5)
Chloride: 99 mEq/L (ref 96–112)
Creatinine, Ser: 0.81 mg/dL (ref 0.50–1.35)
GFR calc Af Amer: >90 mL/min (ref >90)
GFR calc non Af Amer: >90 mL/min (ref >90)
Glucose, Bld: 93 mg/dL (ref 70–99)
Total Bilirubin: 0.8 mg/dL (ref 0.3–1.2)

## 2012-08-08 LAB — GRAM STAIN: Gram Stain: NONE SEEN

## 2012-08-08 LAB — PROTIME-INR
INR: 1 (ref 0.00–1.49)
Prothrombin Time: 13.1 seconds (ref 11.6–15.2)

## 2012-08-08 LAB — APTT: aPTT: 40 seconds — ABNORMAL HIGH (ref 24–37)

## 2012-08-08 SURGERY — ANTERIOR CERVICAL DECOMPRESSION/DISCECTOMY FUSION 1 LEVEL
Anesthesia: General | Site: Neck | Wound class: Clean Contaminated

## 2012-08-08 SURGERY — CANCELLED PROCEDURE

## 2012-08-08 MED ORDER — HYDROMORPHONE HCL PF 1 MG/ML IJ SOLN
INTRAMUSCULAR | Status: AC
  Filled 2012-08-08: qty 1

## 2012-08-08 MED ORDER — THROMBIN 20000 UNITS EX KIT
PACK | CUTANEOUS | Status: AC
  Filled 2012-08-08: qty 1

## 2012-08-08 MED ORDER — KETOROLAC TROMETHAMINE 30 MG/ML IJ SOLN
INTRAMUSCULAR | Status: AC
  Filled 2012-08-08: qty 1

## 2012-08-08 MED ORDER — SODIUM CHLORIDE 0.9 % IJ SOLN
10.0000 mL | Freq: Two times a day (BID) | INTRAMUSCULAR | Status: DC
  Administered 2012-08-09 (×2): 10 mL

## 2012-08-08 MED ORDER — SUFENTANIL CITRATE 50 MCG/ML IV SOLN
INTRAVENOUS | Status: DC | PRN
  Administered 2012-08-08: 35 ug via INTRAVENOUS
  Administered 2012-08-08: 10 ug via INTRAVENOUS
  Administered 2012-08-08: 15 ug via INTRAVENOUS
  Administered 2012-08-08 (×4): 10 ug via INTRAVENOUS

## 2012-08-08 MED ORDER — HYDROCODONE-ACETAMINOPHEN 5-325 MG PO TABS
1.0000 | ORAL_TABLET | ORAL | Status: DC | PRN
  Administered 2012-08-11: 2 via ORAL
  Filled 2012-08-08: qty 2

## 2012-08-08 MED ORDER — HYDROMORPHONE HCL PF 1 MG/ML IJ SOLN
0.2500 mg | INTRAMUSCULAR | Status: DC | PRN
  Administered 2012-08-08 (×4): 0.5 mg via INTRAVENOUS

## 2012-08-08 MED ORDER — ALUM & MAG HYDROXIDE-SIMETH 200-200-20 MG/5ML PO SUSP
30.0000 mL | Freq: Four times a day (QID) | ORAL | Status: DC | PRN
  Administered 2012-08-11: 30 mL via ORAL
  Filled 2012-08-08: qty 30

## 2012-08-08 MED ORDER — GLYCOPYRROLATE 0.2 MG/ML IJ SOLN
INTRAMUSCULAR | Status: DC | PRN
  Administered 2012-08-08: .6 mg via INTRAVENOUS

## 2012-08-08 MED ORDER — OXYCODONE HCL 5 MG PO TABS
ORAL_TABLET | ORAL | Status: AC
  Filled 2012-08-08: qty 1

## 2012-08-08 MED ORDER — PHENYLEPHRINE HCL 10 MG/ML IJ SOLN
10.0000 mg | INTRAVENOUS | Status: DC | PRN
  Administered 2012-08-08: 10 ug/min via INTRAVENOUS

## 2012-08-08 MED ORDER — ONDANSETRON HCL 4 MG/2ML IJ SOLN
INTRAMUSCULAR | Status: DC | PRN
  Administered 2012-08-08: 4 mg via INTRAVENOUS

## 2012-08-08 MED ORDER — ACETAMINOPHEN 650 MG RE SUPP: 650.0000 mg | RECTAL | Status: DC | PRN

## 2012-08-08 MED ORDER — ALPRAZOLAM 0.5 MG PO TABS
0.5000 mg | ORAL_TABLET | Freq: Three times a day (TID) | ORAL | Status: DC | PRN
  Administered 2012-08-08 – 2012-08-11 (×5): 0.5 mg via ORAL
  Filled 2012-08-08 (×5): qty 1

## 2012-08-08 MED ORDER — MIDAZOLAM HCL 2 MG/2ML IJ SOLN
0.5000 mg | Freq: Once | INTRAMUSCULAR | Status: AC | PRN
  Administered 2012-08-08: 2 mg via INTRAVENOUS

## 2012-08-08 MED ORDER — SODIUM CHLORIDE 0.9 % IJ SOLN
3.0000 mL | Freq: Two times a day (BID) | INTRAMUSCULAR | Status: DC
  Administered 2012-08-08 – 2012-08-12 (×7): 3 mL via INTRAVENOUS

## 2012-08-08 MED ORDER — KETOROLAC TROMETHAMINE 30 MG/ML IJ SOLN
30.0000 mg | Freq: Once | INTRAMUSCULAR | Status: AC
  Administered 2012-08-08: 30 mg via INTRAVENOUS
  Filled 2012-08-08: qty 1

## 2012-08-08 MED ORDER — OXYCODONE HCL 5 MG PO TABS
5.0000 mg | ORAL_TABLET | Freq: Once | ORAL | Status: AC | PRN
  Administered 2012-08-08: 5 mg via ORAL

## 2012-08-08 MED ORDER — LACTATED RINGERS IV SOLN
INTRAVENOUS | Status: DC | PRN
  Administered 2012-08-08 (×3): via INTRAVENOUS

## 2012-08-08 MED ORDER — ONDANSETRON HCL 4 MG/2ML IJ SOLN: 4.0000 mg | INTRAMUSCULAR | Status: DC | PRN

## 2012-08-08 MED ORDER — POLYETHYLENE GLYCOL 3350 17 G PO PACK: 17.0000 g | PACK | Freq: Every day | ORAL | Status: DC | PRN

## 2012-08-08 MED ORDER — PROPOFOL 10 MG/ML IV BOLUS
INTRAVENOUS | Status: DC | PRN
  Administered 2012-08-08: 200 mg via INTRAVENOUS

## 2012-08-08 MED ORDER — MENTHOL 3 MG MT LOZG
1.0000 | LOZENGE | OROMUCOSAL | Status: DC | PRN
  Filled 2012-08-08: qty 9

## 2012-08-08 MED ORDER — ACETAMINOPHEN 325 MG PO TABS: 650.0000 mg | ORAL_TABLET | ORAL | Status: DC | PRN

## 2012-08-08 MED ORDER — HYDROMORPHONE HCL PF 1 MG/ML IJ SOLN
0.5000 mg | INTRAMUSCULAR | Status: DC | PRN
  Administered 2012-08-08 – 2012-08-11 (×17): 1 mg via INTRAVENOUS
  Filled 2012-08-08 (×17): qty 1

## 2012-08-08 MED ORDER — OXYCODONE-ACETAMINOPHEN 5-325 MG PO TABS
ORAL_TABLET | ORAL | Status: AC
  Filled 2012-08-08: qty 2

## 2012-08-08 MED ORDER — POTASSIUM CHLORIDE IN NACL 20-0.45 MEQ/L-% IV SOLN
INTRAVENOUS | Status: DC
  Administered 2012-08-08 – 2012-08-10 (×3): via INTRAVENOUS
  Filled 2012-08-08 (×9): qty 1000

## 2012-08-08 MED ORDER — OXYCODONE-ACETAMINOPHEN 5-325 MG PO TABS
1.0000 | ORAL_TABLET | ORAL | Status: DC | PRN
  Administered 2012-08-08 – 2012-08-09 (×2): 2 via ORAL
  Administered 2012-08-09: 1 via ORAL
  Administered 2012-08-11 – 2012-08-12 (×5): 2 via ORAL
  Filled 2012-08-08 (×8): qty 2

## 2012-08-08 MED ORDER — OXYCODONE HCL ER 10 MG PO T12A
20.0000 mg | EXTENDED_RELEASE_TABLET | Freq: Two times a day (BID) | ORAL | Status: DC
  Administered 2012-08-08 – 2012-08-12 (×8): 20 mg via ORAL
  Filled 2012-08-08 (×7): qty 2
  Filled 2012-08-08 (×2): qty 1

## 2012-08-08 MED ORDER — BISACODYL 5 MG PO TBEC: 5.0000 mg | DELAYED_RELEASE_TABLET | Freq: Every day | ORAL | Status: DC | PRN

## 2012-08-08 MED ORDER — SODIUM CHLORIDE 0.9 % IV SOLN: 250.0000 mL | INTRAVENOUS | Status: DC

## 2012-08-08 MED ORDER — DOCUSATE SODIUM 100 MG PO CAPS
100.0000 mg | ORAL_CAPSULE | Freq: Two times a day (BID) | ORAL | Status: DC
  Administered 2012-08-08 – 2012-08-12 (×8): 100 mg via ORAL
  Filled 2012-08-08 (×11): qty 1

## 2012-08-08 MED ORDER — MIDAZOLAM HCL 5 MG/5ML IJ SOLN
INTRAMUSCULAR | Status: DC | PRN
  Administered 2012-08-08: 2 mg via INTRAVENOUS

## 2012-08-08 MED ORDER — MIDAZOLAM HCL 2 MG/2ML IJ SOLN
INTRAMUSCULAR | Status: AC
  Filled 2012-08-08: qty 2

## 2012-08-08 MED ORDER — OXYCODONE HCL 5 MG/5ML PO SOLN: 5.0000 mg | Freq: Once | ORAL | Status: AC | PRN

## 2012-08-08 MED ORDER — SODIUM CHLORIDE 0.9 % IJ SOLN
10.0000 mL | INTRAMUSCULAR | Status: DC | PRN
  Administered 2012-08-08 – 2012-08-11 (×3): 10 mL

## 2012-08-08 MED ORDER — BUPIVACAINE-EPINEPHRINE 0.25% -1:200000 IJ SOLN
INTRAMUSCULAR | Status: AC
  Filled 2012-08-08: qty 1

## 2012-08-08 MED ORDER — MAGNESIUM CITRATE PO SOLN: 1.0000 | Freq: Once | ORAL | Status: AC | PRN

## 2012-08-08 MED ORDER — PHENOL 1.4 % MT LIQD
1.0000 | OROMUCOSAL | Status: DC | PRN
  Administered 2012-08-10: 1 via OROMUCOSAL
  Filled 2012-08-08: qty 177

## 2012-08-08 MED ORDER — BUPIVACAINE-EPINEPHRINE (PF) 0.5% -1:200000 IJ SOLN
INTRAMUSCULAR | Status: AC
  Filled 2012-08-08: qty 10

## 2012-08-08 MED ORDER — PROMETHAZINE HCL 25 MG/ML IJ SOLN: 6.2500 mg | INTRAMUSCULAR | Status: DC | PRN

## 2012-08-08 MED ORDER — ROCURONIUM BROMIDE 100 MG/10ML IV SOLN
INTRAVENOUS | Status: DC | PRN
  Administered 2012-08-08 (×2): 20 mg via INTRAVENOUS
  Administered 2012-08-08 (×2): 10 mg via INTRAVENOUS
  Administered 2012-08-08: 50 mg via INTRAVENOUS
  Administered 2012-08-08: 10 mg via INTRAVENOUS

## 2012-08-08 MED ORDER — NEOSTIGMINE METHYLSULFATE 1 MG/ML IJ SOLN
INTRAMUSCULAR | Status: DC | PRN
  Administered 2012-08-08: 5 mg via INTRAVENOUS

## 2012-08-08 MED ORDER — SODIUM CHLORIDE 0.9 % IJ SOLN: 3.0000 mL | INTRAMUSCULAR | Status: DC | PRN

## 2012-08-08 MED ORDER — MENTHOL 3 MG MT LOZG: 1.0000 | LOZENGE | OROMUCOSAL | Status: DC | PRN

## 2012-08-08 MED ORDER — LIDOCAINE HCL (CARDIAC) 20 MG/ML IV SOLN
INTRAVENOUS | Status: DC | PRN
  Administered 2012-08-08: 20 mg via INTRAVENOUS

## 2012-08-08 MED ORDER — MEPERIDINE HCL 25 MG/ML IJ SOLN: 6.2500 mg | INTRAMUSCULAR | Status: DC | PRN

## 2012-08-08 MED ORDER — VANCOMYCIN HCL IN DEXTROSE 1-5 GM/200ML-% IV SOLN
1000.0000 mg | Freq: Three times a day (TID) | INTRAVENOUS | Status: DC
  Administered 2012-08-08 – 2012-08-10 (×6): 1000 mg via INTRAVENOUS
  Filled 2012-08-08 (×8): qty 200

## 2012-08-08 MED ORDER — THROMBIN 5000 UNITS EX SOLR
CUTANEOUS | Status: AC
  Filled 2012-08-08: qty 5000

## 2012-08-08 MED ORDER — SERTRALINE HCL 100 MG PO TABS
100.0000 mg | ORAL_TABLET | Freq: Every day | ORAL | Status: DC
  Administered 2012-08-09 – 2012-08-12 (×4): 100 mg via ORAL
  Filled 2012-08-08 (×5): qty 1

## 2012-08-08 MED ORDER — BUPIVACAINE-EPINEPHRINE 0.5% -1:200000 IJ SOLN
INTRAMUSCULAR | Status: DC | PRN
  Administered 2012-08-08: 10 mL

## 2012-08-08 MED ORDER — CYCLOBENZAPRINE HCL 10 MG PO TABS
10.0000 mg | ORAL_TABLET | Freq: Three times a day (TID) | ORAL | Status: DC
  Administered 2012-08-08 – 2012-08-12 (×12): 10 mg via ORAL
  Filled 2012-08-08 (×17): qty 1

## 2012-08-08 MED ORDER — DEXTROSE 5 % IV SOLN
1.0000 g | Freq: Two times a day (BID) | INTRAVENOUS | Status: DC
  Administered 2012-08-08 – 2012-08-10 (×4): 1 g via INTRAVENOUS
  Filled 2012-08-08 (×6): qty 10

## 2012-08-08 MED ORDER — MUPIROCIN 2 % EX OINT
TOPICAL_OINTMENT | CUTANEOUS | Status: AC
  Filled 2012-08-08: qty 22

## 2012-08-08 MED ORDER — SENNA 8.6 MG PO TABS
1.0000 | ORAL_TABLET | Freq: Two times a day (BID) | ORAL | Status: DC
  Administered 2012-08-08 – 2012-08-12 (×8): 8.6 mg via ORAL
  Filled 2012-08-08 (×9): qty 1

## 2012-08-08 SURGICAL SUPPLY — 62 items
BLADE SURG ROTATE 9660 (MISCELLANEOUS) IMPLANT
BUR ROUND FLUTED 4 SOFT TCH (BURR) IMPLANT
BUR SABER RD CUTTING 3.0 (BURR) ×4 IMPLANT
CLOTH BEACON ORANGE TIMEOUT ST (SAFETY) ×2 IMPLANT
CORDS BIPOLAR (ELECTRODE) ×4 IMPLANT
COVER MAYO STAND STRL (DRAPES) ×2 IMPLANT
COVER SURGICAL LIGHT HANDLE (MISCELLANEOUS) ×2 IMPLANT
DERMABOND ADVANCED (GAUZE/BANDAGES/DRESSINGS) ×1
DERMABOND ADVANCED .7 DNX12 (GAUZE/BANDAGES/DRESSINGS) ×1 IMPLANT
DRAIN JACKSON PRATT 10MM FLAT (MISCELLANEOUS) ×2 IMPLANT
DRAIN JACKSON RD 7FR 3/32 (WOUND CARE) ×2 IMPLANT
DRAIN TLS ROUND 10FR (DRAIN) IMPLANT
DRAPE C-ARM 42X72 X-RAY (DRAPES) ×4 IMPLANT
DRAPE MICROSCOPE LEICA (MISCELLANEOUS) ×2 IMPLANT
DRAPE POUCH INSTRU U-SHP 10X18 (DRAPES) ×2 IMPLANT
DRAPE SURG 17X23 STRL (DRAPES) ×12 IMPLANT
DRSG MEPILEX BORDER 4X4 (GAUZE/BANDAGES/DRESSINGS) ×4 IMPLANT
DURAPREP 26ML APPLICATOR (WOUND CARE) ×2 IMPLANT
ELECT BLADE 4.0 EZ CLEAN MEGAD (MISCELLANEOUS)
ELECT COATED BLADE 2.86 ST (ELECTRODE) ×2 IMPLANT
ELECT REM PT RETURN 9FT ADLT (ELECTROSURGICAL) ×2
ELECTRODE BLDE 4.0 EZ CLN MEGD (MISCELLANEOUS) IMPLANT
ELECTRODE REM PT RTRN 9FT ADLT (ELECTROSURGICAL) ×1 IMPLANT
EVACUATOR SILICONE 100CC (DRAIN) ×2 IMPLANT
GLOVE BIOGEL PI IND STRL 7.5 (GLOVE) ×1 IMPLANT
GLOVE BIOGEL PI INDICATOR 7.5 (GLOVE) ×1
GLOVE ECLIPSE 7.0 STRL STRAW (GLOVE) ×2 IMPLANT
GLOVE ECLIPSE 8.5 STRL (GLOVE) ×2 IMPLANT
GLOVE SURG 8.5 LATEX PF (GLOVE) ×2 IMPLANT
GOWN PREVENTION PLUS LG XLONG (DISPOSABLE) IMPLANT
GOWN PREVENTION PLUS XXLARGE (GOWN DISPOSABLE) ×2 IMPLANT
GOWN STRL NON-REIN LRG LVL3 (GOWN DISPOSABLE) ×4 IMPLANT
HEAD HALTER (SOFTGOODS) ×2 IMPLANT
KIT BASIN OR (CUSTOM PROCEDURE TRAY) ×2 IMPLANT
KIT ROOM TURNOVER OR (KITS) ×2 IMPLANT
MANIFOLD NEPTUNE II (INSTRUMENTS) ×2 IMPLANT
NEEDLE SPNL 20GX3.5 QUINCKE YW (NEEDLE) ×2 IMPLANT
NS IRRIG 1000ML POUR BTL (IV SOLUTION) ×2 IMPLANT
PACK ORTHO CERVICAL (CUSTOM PROCEDURE TRAY) ×2 IMPLANT
PAD ARMBOARD 7.5X6 YLW CONV (MISCELLANEOUS) ×4 IMPLANT
PATTIES SURGICAL .5 X.5 (GAUZE/BANDAGES/DRESSINGS) IMPLANT
PIN DISTRACTION 14MM (PIN) ×4 IMPLANT
SPONGE INTESTINAL PEANUT (DISPOSABLE) ×2 IMPLANT
SPONGE LAP 4X18 X RAY DECT (DISPOSABLE) IMPLANT
SPONGE SURGIFOAM ABS GEL 100 (HEMOSTASIS) IMPLANT
SUT ETHILON 2 0 FS 18 (SUTURE) ×2 IMPLANT
SUT PROLENE 3 0 PS 2 (SUTURE) ×2 IMPLANT
SUT VIC AB 0 CT1 27 (SUTURE) ×1
SUT VIC AB 0 CT1 27XBRD ANBCTR (SUTURE) ×1 IMPLANT
SUT VIC AB 1 CT1 27 (SUTURE) ×1
SUT VIC AB 1 CT1 27XBRD ANBCTR (SUTURE) ×1 IMPLANT
SUT VIC AB 2-0 CT1 27 (SUTURE) ×1
SUT VIC AB 2-0 CT1 36 (SUTURE) ×2 IMPLANT
SUT VIC AB 2-0 CT1 TAPERPNT 27 (SUTURE) ×1 IMPLANT
SUT VIC AB 3-0 FS2 27 (SUTURE) IMPLANT
SUT VICRYL 4-0 PS2 18IN ABS (SUTURE) ×4 IMPLANT
SYR 30ML LL (SYRINGE) ×2 IMPLANT
SYSTEM CHEST DRAIN TLS 7FR (DRAIN) IMPLANT
TOWEL OR 17X24 6PK STRL BLUE (TOWEL DISPOSABLE) ×2 IMPLANT
TOWEL OR 17X26 10 PK STRL BLUE (TOWEL DISPOSABLE) ×2 IMPLANT
TRAY FOLEY CATH 14FR (SET/KITS/TRAYS/PACK) IMPLANT
WATER STERILE IRR 1000ML POUR (IV SOLUTION) ×2 IMPLANT

## 2012-08-08 SURGICAL SUPPLY — 51 items
BLADE SURG ROTATE 9660 (MISCELLANEOUS) IMPLANT
BUR ROUND FLUTED 4 SOFT TCH (BURR) ×2 IMPLANT
BUR SABER RD CUTTING 3.0 (BURR) ×2 IMPLANT
CLOTH BEACON ORANGE TIMEOUT ST (SAFETY) ×2 IMPLANT
CORDS BIPOLAR (ELECTRODE) ×2 IMPLANT
COVER SURGICAL LIGHT HANDLE (MISCELLANEOUS) ×2 IMPLANT
DERMABOND ADVANCED (GAUZE/BANDAGES/DRESSINGS) ×1
DERMABOND ADVANCED .7 DNX12 (GAUZE/BANDAGES/DRESSINGS) ×1 IMPLANT
DRAIN TLS ROUND 10FR (DRAIN) IMPLANT
DRAPE C-ARM 42X72 X-RAY (DRAPES) ×2 IMPLANT
DRAPE MICROSCOPE LEICA (MISCELLANEOUS) ×2 IMPLANT
DRAPE POUCH INSTRU U-SHP 10X18 (DRAPES) ×2 IMPLANT
DRAPE SURG 17X23 STRL (DRAPES) ×6 IMPLANT
DRSG MEPILEX BORDER 4X4 (GAUZE/BANDAGES/DRESSINGS) IMPLANT
DURAPREP 6ML APPLICATOR 50/CS (WOUND CARE) ×2 IMPLANT
ELECT BLADE 4.0 EZ CLEAN MEGAD (MISCELLANEOUS)
ELECT COATED BLADE 2.86 ST (ELECTRODE) ×2 IMPLANT
ELECT REM PT RETURN 9FT ADLT (ELECTROSURGICAL) ×2
ELECTRODE BLDE 4.0 EZ CLN MEGD (MISCELLANEOUS) IMPLANT
ELECTRODE REM PT RTRN 9FT ADLT (ELECTROSURGICAL) ×1 IMPLANT
GLOVE BIOGEL PI IND STRL 7.5 (GLOVE) ×1 IMPLANT
GLOVE BIOGEL PI INDICATOR 7.5 (GLOVE) ×1
GLOVE ECLIPSE 7.0 STRL STRAW (GLOVE) ×2 IMPLANT
GLOVE ECLIPSE 8.5 STRL (GLOVE) ×2 IMPLANT
GLOVE SURG 8.5 LATEX PF (GLOVE) ×2 IMPLANT
GOWN PREVENTION PLUS LG XLONG (DISPOSABLE) IMPLANT
GOWN PREVENTION PLUS XXLARGE (GOWN DISPOSABLE) ×2 IMPLANT
GOWN STRL NON-REIN LRG LVL3 (GOWN DISPOSABLE) ×4 IMPLANT
HEAD HALTER (SOFTGOODS) ×2 IMPLANT
KIT BASIN OR (CUSTOM PROCEDURE TRAY) ×2 IMPLANT
KIT ROOM TURNOVER OR (KITS) ×2 IMPLANT
MANIFOLD NEPTUNE II (INSTRUMENTS) ×2 IMPLANT
NEEDLE SPNL 20GX3.5 QUINCKE YW (NEEDLE) ×4 IMPLANT
NS IRRIG 1000ML POUR BTL (IV SOLUTION) ×2 IMPLANT
PACK ORTHO CERVICAL (CUSTOM PROCEDURE TRAY) ×2 IMPLANT
PAD ARMBOARD 7.5X6 YLW CONV (MISCELLANEOUS) ×4 IMPLANT
PATTIES SURGICAL .5 X.5 (GAUZE/BANDAGES/DRESSINGS) IMPLANT
PIN DISTRACTION 14MM (PIN) ×4 IMPLANT
SPONGE INTESTINAL PEANUT (DISPOSABLE) IMPLANT
SPONGE LAP 4X18 X RAY DECT (DISPOSABLE) IMPLANT
SPONGE SURGIFOAM ABS GEL 100 (HEMOSTASIS) IMPLANT
SUT VIC AB 2-0 CT1 27 (SUTURE) ×1
SUT VIC AB 2-0 CT1 TAPERPNT 27 (SUTURE) ×1 IMPLANT
SUT VIC AB 3-0 FS2 27 (SUTURE) IMPLANT
SUT VICRYL 4-0 PS2 18IN ABS (SUTURE) ×2 IMPLANT
SYR 30ML LL (SYRINGE) ×2 IMPLANT
SYSTEM CHEST DRAIN TLS 7FR (DRAIN) IMPLANT
TOWEL OR 17X24 6PK STRL BLUE (TOWEL DISPOSABLE) ×2 IMPLANT
TOWEL OR 17X26 10 PK STRL BLUE (TOWEL DISPOSABLE) ×2 IMPLANT
TRAY FOLEY CATH 14FR (SET/KITS/TRAYS/PACK) IMPLANT
WATER STERILE IRR 1000ML POUR (IV SOLUTION) ×2 IMPLANT

## 2012-08-08 NOTE — Transfer of Care (Signed)
Immediate Anesthesia Transfer of Care Note  Patient: Christopher Berg  Procedure(s) Performed: Procedure(s): Incision, drainage and debridement of C5-6 prevertebral and epidural abscess with fusion and left iliac crest bone graft (N/A)  Patient Location: PACU  Anesthesia Type:General  Level of Consciousness: awake, alert , oriented and patient cooperative  Airway & Oxygen Therapy: Patient Spontanous Breathing and Patient connected to nasal cannula oxygen  Post-op Assessment: Report given to PACU RN, Post -op Vital signs reviewed and stable, Patient moving all extremities and Patient moving all extremities X 4  Post vital signs: Reviewed and stable  Complications: No apparent anesthesia complications

## 2012-08-08 NOTE — Anesthesia Procedure Notes (Signed)
Date/Time: 08/08/2012 9:41 AM Performed by: Coralee Rud Pre-anesthesia Checklist: Patient identified, Emergency Drugs available, Suction available, Patient being monitored and Timeout performed Patient Re-evaluated:Patient Re-evaluated prior to inductionOxygen Delivery Method: Circle system utilized Preoxygenation: Pre-oxygenation with 100% oxygen Intubation Type: IV induction Ventilation: Mask ventilation without difficulty Laryngoscope size: Elective glidescope. Grade View: Grade I Tube size: 7.5 mm Number of attempts: 1 Airway Equipment and Method: Stylet and Video-laryngoscopy Post-procedure assessment: Glidescope. Secured at: 23 cm Tube secured with: Tape

## 2012-08-08 NOTE — Brief Op Note (Signed)
08/08/2012  2:07 PM  PATIENT:  Christopher Berg  32 y.o. male  PRE-OPERATIVE DIAGNOSIS:  cervical five six discitis, epidural and prevertebral abscess  POST-OPERATIVE DIAGNOSIS:  cerivical five six discitis, epidural and prevertebral abscess  PROCEDURE:  Procedure(s): Incision, drainage and debridement of C5-6 prevertebral and epidural abscess with fusion and left iliac crest bone graft (N/A)strut graft.  SURGEON:  Surgeon(s) and Role: Kerrin Champagne, MD - Primary  ANESTHESIA:  local, general Dr. Jean Rosenthal.   EBL:  Total I/O In: 2600 [I.V.:2600] Out: 800 [Urine:800]  BLOOD ADMINISTERED:none  DRAINS: (One ) Jackson-Pratt drain(s) with closed bulb suction in the left anterior neck and Urinary Catheter (Foley)   LOCAL MEDICATIONS USED:  MARCAINE    and Amount: 25 ml  SPECIMEN:  Source of Specimen:  Prevertebral abscess and disc C5-6 and Aspirate  DISPOSITION OF SPECIMEN:  microbiology  COUNTS:  YES  TOURNIQUET:  * No tourniquets in log *  DICTATION: .Dragon Dictation  PLAN OF CARE: Admit to inpatient   PATIENT DISPOSITION:  PACU - hemodynamically stable.   Delay start of Pharmacological VTE agent (>24hrs) due to surgical blood loss or risk of bleeding: yes

## 2012-08-08 NOTE — H&P (Signed)
Patient was seen and examined in the preop holding area. There has been no interval  Change in this patient's exam preop  history and physical exam  Lab tests and images have been examined and reviewed.  The Risks benefits and alternative treatments have been discussed  extensively,questions answered.  The patient has elected to undergo the discussed surgical treatment. 

## 2012-08-08 NOTE — Anesthesia Postprocedure Evaluation (Signed)
  Anesthesia Post-op Note  Patient: Christopher Berg  Procedure(s) Performed: Procedure(s): Incision, drainage and debridement of C5-6 prevertebral and epidural abscess with fusion and left iliac crest bone graft (N/A)  Patient Location: PACU  Anesthesia Type:General  Level of Consciousness: awake, alert , oriented and patient cooperative  Airway and Oxygen Therapy: Patient Spontanous Breathing and Patient connected to nasal cannula oxygen  Post-op Pain: mild  Post-op Assessment: Post-op Vital signs reviewed, Patient's Cardiovascular Status Stable, Respiratory Function Stable, Patent Airway, No signs of Nausea or vomiting and Pain level controlled  Post-op Vital Signs: Reviewed and stable  Complications: No apparent anesthesia complications

## 2012-08-08 NOTE — Progress Notes (Signed)
ANTIBIOTIC CONSULT NOTE - INITIAL  Pharmacy Consult for Vancomycin / Rocephin Indication: diskitis, s/p I+D, epidural abscess  Allergies  Allergen Reactions  . Ace Inhibitors     Patient Measurements: Height: 5\' 10"  (177.8 cm) Weight: 150 lb (68.04 kg) IBW/kg (Calculated) : 73  Labs:  Recent Labs  08/08/12 0740  WBC 4.9  HGB 15.4  PLT 172  CREATININE 0.81   Estimated Creatinine Clearance: 128.3 ml/min (by C-G formula based on Cr of 0.81). No results found for this basename: VANCOTROUGH, Leodis Binet, VANCORANDOM, GENTTROUGH, GENTPEAK, GENTRANDOM, TOBRATROUGH, TOBRAPEAK, TOBRARND, AMIKACINPEAK, AMIKACINTROU, AMIKACIN,  in the last 72 hours   Microbiology: Recent Results (from the past 720 hour(s))  MRSA PCR SCREENING     Status: None   Collection Time    08/08/12  7:40 AM      Result Value Range Status   MRSA by PCR NEGATIVE  NEGATIVE Final   Comment:            The GeneXpert MRSA Assay (FDA     approved for NASAL specimens     only), is one component of a     comprehensive MRSA colonization     surveillance program. It is not     intended to diagnose MRSA     infection nor to guide or     monitor treatment for     MRSA infections.  GRAM STAIN     Status: None   Collection Time    08/08/12 12:00 PM      Result Value Range Status   Specimen Description ABSCESS LEFT NECK C5 6   Final   Special Requests NONE   Final   Gram Stain     Final   Value: NO WBC SEEN     NO ORGANISMS SEEN   Report Status 08/08/2012 FINAL   Final  GRAM STAIN     Status: None   Collection Time    08/08/12  1:32 PM      Result Value Range Status   Specimen Description TISSUE PREVERTEBRAL C 5 6   Final   Special Requests NONE   Final   Gram Stain     Final   Value: NO WBC SEEN     NO ORGANISMS SEEN   Report Status 08/08/2012 FINAL   Final    Medical History: Past Medical History  Diagnosis Date  . Hepatitis C infection   . Depression   . Anxiety   . Asthma     "as a child"  .  Kidney stone     hx of  . Urinary tract infection     hx of  . Headache     "occas"  . Chronic neck pain    Assessment: 32 year old with diskitis.  S/p I+D of C5-6 prevertebral and epidural abscess with fusion and left ilac chest bone graft  Goal of Therapy:  Vancomycin trough level 15-20 mcg/ml Appropriate Rocephin dosing  Plan:  1) Rocephin 1 Gram iv Q 12 hours 2) Vancomycin 1 Gram iv Q 8 hours 3) Follow up Scr, cultures, plan  Thank you. Okey Regal, PharmD [phone]  Elwin Sleight 08/08/2012,4:32 PM

## 2012-08-08 NOTE — Op Note (Addendum)
08/08/2012  2:15 PM  PATIENT:  [name]  32 y.o. male  MRN: [id number]  OPERATIVE REPORT  PRE-OPERATIVE DIAGNOSIS:  cervical five six discitis, epidural and prevertebral abscess  POST-OPERATIVE DIAGNOSIS:  cerivical five six discitis, epidural and prevertebral abscess, vertebral osteomyelitis C5 and C6.  PROCEDURE:  Procedure(s): Incision, drainage and debridement of C5-6 prevertebral and epidural abscess with fusion with left iliac crest bone graft. Partial vertebrectomy on either side of the C5-6 disc space with excision of epidural phlegmon. Portions of bone and soft tissue from the prevertebral abscess and disc C5-6 sent for cultures.    SURGEON:  Kerrin Champagne, MD     ANESTHESIA:  General, supplemented with local Marcaine 1/2% with epi 25 cc. Dr. Jean Rosenthal.    COMPLICATIONS:  None.     DRAINS: Flat large Al Pimple to Bulb. Foley during case d/ced at the end of OR case.  COMPONENTS: 14mm x 12mm x17mm left tricortical iliac bone graft C5-6.  PROCEDURE:The patient was met in the holding area, and the appropriate  left C5.6 cervical level identified and marked with an "x" and my initials. The patient was then transported to OR and was placed on the operative table in a supine position head supported on the well padded Mayfield horseshoe. The patient was then placed under  general anesthesia without difficulty intubated atraumaticly. A Foley catheter was placed. The catheter was removed at the end of the case.     Cervical spine was positioned with a Mayfield horseshoe and 5 pound cervical halter traction. All pressure points well padded and semi-beach chair position. Arm holder both arms. Standard prep with DuraPrep solution the anterior cervical spine chest. Bump elevating the left iliac crest. Left iliac crest prepped with dura prep. Draped in the usual manner. Iodine vi drape was used. Standard timeout protocol was carried out identifying the patient procedure side of the  procedure and level. First left iliac crest bone graft was harvested through a separate incision 2 inches posterior to the anterior superior iliac spine along the lateral aspect of the iliac crest incision with #10 blade scalpel. Incision carried down to the iliac crest superiorly and laterally using electrocautery. Subperiosteal dissection used to expose the superior medial and lateral aspects of the crest over an area of an inch and a half. Cobb was used to protect medial structures and lateral structures while a small oscillating saw used to divide the iliac crest transversely from superficial to deep. Osteotome was then used to divide the deep cortex and a section of bone approximately an inch in length by 15-16 mm in depth was removed. Irrigation then carried out  With copious amounts of irrigant solution bone wax applied to the bleeding cancellus bone surfaces. No active bleeding present periosteum then reapproximated with interrupted #1 Vicryl sutures  Subcutaneous layers approximated with interrupted 0 and 2-0 Vicryl sutures and the skin closed with a running subcutaneous stitch of 3-0 Vicryl. Dermabond was applied. The skin the left neck was infiltrated with Marcaine with epinephrine total of 7 cc. This at the level of expected C5-6 incision and also along the anterior aspect of the patient's sternocleidomastoid. Incision carried down to the level of the platysma. The platysma was then incised in line with the skin incision . Then was carried down to the anterior aspect of the sternocleidomastoid muscle. The interval between the trachea and esophagus medially and the carotid sheath laterally was developed as a Metzenbaum scissors and blunt dissection exposing the anterior aspect  of cervical spine at the C6 level.  The prevertebral fascia anterior to the cervical was cauterized with bipolar and teased across the midline with a Barista. An 18-gauge spinal needle was placed with sheath intact allowing  only a centimeter to extend into the C5-6 disc and observed on lateral C-arm  at the C5-6 level. Handheld Cloward retraction of the soft tissues while identifying the level at C5-6 and also while removing a portion of the anterior aspect of the disc with15 blade scalpel and pituitary. The disc space was opened over its anterior aspect secondary to infection. Soft bone noted to be present inferior and superior to the disc. Soft tissue edema noted over the prevertebral fascia extending upwards to C3. Irrigation was carried out with nearly 700 cc of normal saline solution following removal of soft tissue and bone about the C5-6 disc is for culture. Medial border of the longus collie muscles was carefully elevated bilaterally and self-retaining retractors were introduced the foot of the blade beneath the medial border of the longus colli muscles. Soft tissue overlying the anterior borders of the disc space at C5-6 level carefully debridement of soft tissue back to bony edges with a small key elevator.The anterior lip osteophytes were then resected using rongeur. This bone was sent for soft tissue culture along with swabs from the patient's disc space an anterior cervical prevertebral region. These percent for anaerobe, aerobic, fungal and AFB.Marland Kitchen14 mm screw posts were placed into the superior aspect of the vertebral body of C5 and the inferior aspect of the vertebral body of C6 in order to distract the disc space and restore lordosis.  The disc space was then first prepared using loupe magnification and headlight with resection of degenerative disc annulus anteriorly and posteriorly and cartilaginous endplates using micro-curettes pituitaries and a high-speed bur. The operating room microscope was draped sterilely and brought into the field. Under the operating room microscope and posterior aspect of the disc was excised using micro-curettes pituitary rongeur and times per. Posterior lip osteophytes were drilled using a  high-speed bur and a carefully resected with 1 and 2 mm Kerrison foraminotomy performed over both C6 nerve roots using 1 and 2 mm Kerrisons phlegmon material was noted centrally and into the foramen this was debrided using pituitary Aundria Rud and [employer]. Following decompression of the spinal cord and both C6 nerve roots, irrigation was carried out. The vertebral body of the inferior aspect of C5 and superior aspect of C6 were carefully prepared using a high-speed bur to parallel carried down to bleeding normal appearing bone.. The disc space was then sounded utilized and measured for depth at 19-20 mm height of 15 mm with a 12 mm.iliac crest bone graft spacer was felt to provide best fit for the C5-6 disc space. The tricortical iliac crest bone graft cut to the dimensions using an oscillating saw and high-speed bur  was then brought onto the field. The iliac crest graft  was then impacted into place with the head placed in longitudinal cervical traction.  The implant was felt to be in excellent position alignment. Cervical distraction instrumentation was removed and the screw post holes coated with wax for hemostasis. Intraoperative lateral  C-arm  demonstrated the  iliac crest strut graft  in good position alignment no sign of impingement upon the cervical canal.  This point irrigation was carried out at cervical incision site.The esophagus examined at the cervical level  and found to be normal. Irrigation was again carried out there was no  active bleeding present.  A flat Jackson-Pratt drain cut obliquely to allow for active draining closer to the skin was sewn into place exiting through a separate stab incision anterior and distal to the cervical spine incision site. Sewn in place with the 4-0 nylon stitch. The  cervical incision was  then closed by approximating the deep layers with 2-0 Vicryl suture and  subcutaneous layers the platysma layer with interrupted 3-0 Vicryl suture and the superficial fascia  overlying the sternocleidomastoid muscle with interrupted 3-0 Vicryl sutures. The subcutaneous layers were approximated with interrupted 3-0 Vicryl sutures as were the superficial layers. The skin was closed with a running subcutaneous stitch of  3-0 Prolene.  Mepilex bandage was applied. A [place] collar was then applied to the cervical spine released on the cervical spine and drapes were removed.   [name],[name] 08/08/2012, 2:15 PM

## 2012-08-08 NOTE — Progress Notes (Signed)
Subjective: Day of Surgery Procedure(s) (LRB): Incision, drainage and debridement of C5-6 prevertebral and epidural abscess with fusion and left iliac crest bone graft (N/A)Awake in the recovery room. Alert and O x4.[place] collar in place should change to an Biomedical engineer and Keep the philadephia collar for use while bathing. Collar to be used at all times.  Patient reports pain as mild.    Objective:   VITALS:  Temp:  [97.6 F (36.4 C)-97.8 F (36.6 C)] 97.8 F (36.6 C) (03/08 1400) Pulse Rate:  [93-103] 93 (03/08 1530) Resp:  [13-22] 14 (03/08 1530) BP: (90-125)/(60-78) 113/60 mmHg (03/08 1515) SpO2:  [96 %-100 %] 97 % (03/08 1530)  Neurologically intact ABD soft Neurovascular intact Sensation intact distally Intact pulses distally Dorsiflexion/Plantar flexion intact Incision: dressing C/D/I   LABS  Recent Labs  08/08/12 0740  HGB 15.4  WBC 4.9  PLT 172    Recent Labs  08/08/12 0740  NA 138  K 3.7  CL 99  CO2 27  BUN 18  CREATININE 0.81  GLUCOSE 93    Recent Labs  08/08/12 0740  INR 1.00     Assessment/Plan: Day of Surgery Procedure(s) (LRB): Incision, drainage and debridement of C5-6 prevertebral and epidural abscess with fusion and left iliac crest bone graft (N/A) OPERATIVE REPORT  Up with therapy Continue ABX therapy due to Culture taken of surgical site and treatment of abscess and vertebral osteomyelitis. Dr. Ninetta Lights ID contacted for consultation and assistance with IV antibiotic selection. Currently on Vanco/Ceftriaxone. Drain anterior neck is sewn in and should remain until there is no drainage. Keep until results of culture and sensitivity are available. Christopher Berg,Christopher Berg 08/08/2012, 3:40 PM

## 2012-08-08 NOTE — Anesthesia Preprocedure Evaluation (Addendum)
Anesthesia Evaluation  Patient identified by MRN, date of birth, ID band Patient awake    Reviewed: Allergy & Precautions, H&P , NPO status , Patient's Chart, lab work & pertinent test results  History of Anesthesia Complications Negative for: history of anesthetic complications  Airway Mallampati: I TM Distance: >3 FB Neck ROM: Full    Dental  (+) Teeth Intact and Dental Advisory Given   Pulmonary asthma , Current Smoker,  As a child breath sounds clear to auscultation  Pulmonary exam normal       Cardiovascular negative cardio ROS  Rhythm:Regular Rate:Normal     Neuro/Psych  Headaches, PSYCHIATRIC DISORDERS Anxiety Depression    GI/Hepatic negative GI ROS, (+)     substance abuse (IV opiates)  alcohol use, cocaine use, methamphetamine use and IV drug use, Hepatitis - (elevated LFTs), C  Endo/Other    Renal/GU Renal diseasenegative Renal ROS     Musculoskeletal   Abdominal   Peds  Hematology   Anesthesia Other Findings   Reproductive/Obstetrics                        Anesthesia Physical Anesthesia Plan  ASA: III  Anesthesia Plan: General   Post-op Pain Management:    Induction: Intravenous  Airway Management Planned: Oral ETT  Additional Equipment:   Intra-op Plan:   Post-operative Plan: Possible Post-op intubation/ventilation  Informed Consent: I have reviewed the patients History and Physical, chart, labs and discussed the procedure including the risks, benefits and alternatives for the proposed anesthesia with the patient or authorized representative who has indicated his/her understanding and acceptance.   Dental advisory given  Plan Discussed with: Surgeon and CRNA  Anesthesia Plan Comments: (Plan routine monitors, GETA)        Anesthesia Quick Evaluation

## 2012-08-08 NOTE — Consult Note (Signed)
INFECTIOUS DISEASE CONSULT NOTE  Date of Admission:  08/08/2012  Date of Consult:  08/08/2012  Reason for Consult: Epidural Abscess Referring Physician: Otelia Sergeant   Impression/Recommendation Epidural Abscess Hepatitis C  Will stop ceftriaxone and change to imipenem to add pseudomonas/anaerobe coverage.  Await his routine, AFB, fungal cx as well as his bone bx.  Continue to watch his LFTs  My great appreciation to Dr Otelia Sergeant for partnering with [place] in the care of this patient.   Thank you so much for this interesting consult,   [name] [phone]  Christopher Berg is an 32 y.o. male.  HPI: 32 yo M with hx of polysubstance abuse admitted 06-23-12 with severe neck pain. He was found on MRI to have: 1. 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 2. Thecal sac and spinal cord are slightly compressed at C5-6 without myelopathy.  He was eval by ortho and IR and was not felt to be a candidate for drainage. He was started on vanco/ceftriaxone and d/c to SNF on 2-6.  His course was also notable for new finding of Hep C + (type 2).  He had f/u in ID clinic on 2-25 while still on anbx. He had a MRI prior to the appt which showed worsening disease. He  Was admitted on 3-7 for surgical mgmt. Today he underwent I & D of C5-6 prevertebral and epidural abscess with fusion using iliac crest bone graft.     Past Medical History  Diagnosis Date  . Hepatitis C infection   . Depression   . Anxiety   . Asthma     "as a child"  . Kidney stone     hx of  . Urinary tract infection     hx of  . Headache     "occas"  . Chronic neck pain     Past Surgical History  Procedure Laterality Date  . No past surgeries       Allergies  Allergen Reactions  . Ace Inhibitors     Medications:  Scheduled: . cefTRIAXone (ROCEPHIN)  IV  1 g Intravenous Q12H  . cyclobenzaprine  10 mg Oral TID  . docusate sodium  100 mg Oral BID  . HYDROmorphone      . HYDROmorphone       . ketorolac      . midazolam      . mupirocin ointment      . oxyCODONE      . OxyCODONE  20 mg Oral Q12H  . oxyCODONE-acetaminophen      . senna  1 tablet Oral BID  . sertraline  100 mg Oral Daily  . sodium chloride  3 mL Intravenous Q12H  . vancomycin  1,000 mg Intravenous Q8H    Total days of antibiotics: 10          Social History:  reports that he has been smoking Cigarettes.  He has been smoking about 0.50 packs per day. He has never used smokeless tobacco. He reports that  drinks alcohol. He reports that he uses illicit drugs.  History reviewed. No pertinent family history.  General ROS: + dysphagia, normal BM, normal urination, no fever or chills, no change in strength or sensation, see HPI.   Blood pressure 119/77, pulse 92, temperature 98 F (36.7 C), resp. rate 16, height 5\' 10"  (1.778 m), weight 68.04 kg (150 lb), SpO2 100.00%. General appearance: alert, cooperative and no distress Eyes: negative findings: pupils equal, round, reactive to light and  accomodation Throat: normal findings: oropharynx pink & moist without lesions or evidence of thrush Neck: dressed with drain in L anterior neck Lungs: clear to auscultation bilaterally Heart: regular rate and rhythm Abdomen: normal findings: bowel sounds normal and soft, non-tender Extremities: edema none and RUE PIC is clean, non-tender, no cordis Neurologic: Grossly normal   Results for orders placed during the hospital encounter of 08/08/12 (from the past 48 hour(s))  SEDIMENTATION RATE     Status: None   Collection Time    08/08/12  7:40 AM      Result Value Range   Sed Rate 2  0 - 16 mm/hr  CBC WITH DIFFERENTIAL     Status: Abnormal   Collection Time    08/08/12  7:40 AM      Result Value Range   WBC 4.9  4.0 - 10.5 K/uL   RBC 5.01  4.22 - 5.81 MIL/uL   Hemoglobin 15.4  13.0 - 17.0 g/dL   HCT 21.3  08.6 - 57.8 %   MCV 85.6  78.0 - 100.0 fL   MCH 30.7  26.0 - 34.0 pg   MCHC 35.9  30.0 - 36.0 g/dL   RDW  46.9  62.9 - 52.8 %   Platelets 172  150 - 400 K/uL   Neutrophils Relative 34 (*) 43 - 77 %   Neutro Abs 1.6 (*) 1.7 - 7.7 K/uL   Lymphocytes Relative 52 (*) 12 - 46 %   Lymphs Abs 2.5  0.7 - 4.0 K/uL   Monocytes Relative 8  3 - 12 %   Monocytes Absolute 0.4  0.1 - 1.0 K/uL   Eosinophils Relative 6 (*) 0 - 5 %   Eosinophils Absolute 0.3  0.0 - 0.7 K/uL   Basophils Relative 0  0 - 1 %   Basophils Absolute 0.0  0.0 - 0.1 K/uL  COMPREHENSIVE METABOLIC PANEL     Status: Abnormal   Collection Time    08/08/12  7:40 AM      Result Value Range   Sodium 138  135 - 145 mEq/L   Potassium 3.7  3.5 - 5.1 mEq/L   Chloride 99  96 - 112 mEq/L   CO2 27  19 - 32 mEq/L   Glucose, Bld 93  70 - 99 mg/dL   BUN 18  6 - 23 mg/dL   Creatinine, Ser 4.13  0.50 - 1.35 mg/dL   Calcium 24.4  8.4 - 01.0 mg/dL   Total Protein 8.0  6.0 - 8.3 g/dL   Albumin 4.2  3.5 - 5.2 g/dL   AST 272 (*) 0 - 37 U/L   ALT 219 (*) 0 - 53 U/L   Alkaline Phosphatase 98  39 - 117 U/L   Total Bilirubin 0.8  0.3 - 1.2 mg/dL   GFR calc non Af Amer >90  >90 mL/min   GFR calc Af Amer >90  >90 mL/min   Comment:            The eGFR has been calculated     using the CKD EPI equation.     This calculation has not been     validated in all clinical     situations.     eGFR's persistently     <90 mL/min signify     possible Chronic Kidney Disease.  PROTIME-INR     Status: None   Collection Time    08/08/12  7:40 AM      Result Value Range  Prothrombin Time 13.1  11.6 - 15.2 seconds   INR 1.00  0.00 - 1.49  APTT     Status: Abnormal   Collection Time    08/08/12  7:40 AM      Result Value Range   aPTT 40 (*) 24 - 37 seconds   Comment:            IF BASELINE aPTT IS ELEVATED,     SUGGEST PATIENT RISK ASSESSMENT     BE USED TO DETERMINE APPROPRIATE     ANTICOAGULANT THERAPY.  MRSA PCR SCREENING     Status: None   Collection Time    08/08/12  7:40 AM      Result Value Range   MRSA by PCR NEGATIVE  NEGATIVE   Comment:             The GeneXpert MRSA Assay (FDA     approved for NASAL specimens     only), is one component of a     comprehensive MRSA colonization     surveillance program. It is not     intended to diagnose MRSA     infection nor to guide or     monitor treatment for     MRSA infections.  GRAM STAIN     Status: None   Collection Time    08/08/12 12:00 PM      Result Value Range   Specimen Description ABSCESS LEFT NECK C5 6     Special Requests NONE     Gram Stain       Value: NO WBC SEEN     NO ORGANISMS SEEN   Report Status 08/08/2012 FINAL    GRAM STAIN     Status: None   Collection Time    08/08/12  1:32 PM      Result Value Range   Specimen Description TISSUE PREVERTEBRAL C 5 6     Special Requests NONE     Gram Stain       Value: NO WBC SEEN     NO ORGANISMS SEEN   Report Status 08/08/2012 FINAL        Component Value Date/Time   SDES TISSUE PREVERTEBRAL C 5 6 08/08/2012 1332   SPECREQUEST NONE 08/08/2012 1332   CULT NO GROWTH 06/23/2012 2309   REPTSTATUS 08/08/2012 FINAL 08/08/2012 1332   Dg Cervical Spine 2 Or 3 Views  08/08/2012  *RADIOLOGY REPORT*  Clinical Data: C5-C6 graft.  DG C-ARM 1-60 MIN,CERVICAL SPINE - 2-3 VIEW  Comparison:  MRI of 07/28/2012  Findings: 3 intraoperative views.  The first demonstrates a surgical device projecting over the C5-C6 interspace.  The second image is a lateral which images through the top of C7.  The third image is an attempted frontal.  Extensive artifact degradation.  Nondiagnostic.  IMPRESSION: Intraoperative imaging.   Original Report Authenticated By: Jeronimo Greaves, M.D.    Dg C-arm 1-60 Min  08/08/2012  *RADIOLOGY REPORT*  Clinical Data: C5-C6 graft.  DG C-ARM 1-60 MIN,CERVICAL SPINE - 2-3 VIEW  Comparison:  MRI of 07/28/2012  Findings: 3 intraoperative views.  The first demonstrates a surgical device projecting over the C5-C6 interspace.  The second image is a lateral which images through the top of C7.  The third image is an attempted  frontal.  Extensive artifact degradation.  Nondiagnostic.  IMPRESSION: Intraoperative imaging.   Original Report Authenticated By: Jeronimo Greaves, M.D.    Recent Results (from the past 240 hour(s))  MRSA PCR SCREENING  Status: None   Collection Time    08/08/12  7:40 AM      Result Value Range Status   MRSA by PCR NEGATIVE  NEGATIVE Final   Comment:            The GeneXpert MRSA Assay (FDA     approved for NASAL specimens     only), is one component of a     comprehensive MRSA colonization     surveillance program. It is not     intended to diagnose MRSA     infection nor to guide or     monitor treatment for     MRSA infections.  GRAM STAIN     Status: None   Collection Time    08/08/12 12:00 PM      Result Value Range Status   Specimen Description ABSCESS LEFT NECK C5 6   Final   Special Requests NONE   Final   Gram Stain     Final   Value: NO WBC SEEN     NO ORGANISMS SEEN   Report Status 08/08/2012 FINAL   Final  GRAM STAIN     Status: None   Collection Time    08/08/12  1:32 PM      Result Value Range Status   Specimen Description TISSUE PREVERTEBRAL C 5 6   Final   Special Requests NONE   Final   Gram Stain     Final   Value: NO WBC SEEN     NO ORGANISMS SEEN   Report Status 08/08/2012 FINAL   Final      08/08/2012, 5:32 PM     LOS: 0 days

## 2012-08-08 NOTE — H&P (Addendum)
Christopher Berg is an 32 y.o. male.   Chief Complaint:Worsening epidural and prevertebral abscess C5-6.  HPI:32 year old male presented to [facility] 1/11 referred by [facility] for follow up of neck Pain. Had been seen in [place] and [facility] 3 times. Complaining of neck pain and left upper extremity Weakness. Exam was non focal with diffuse left upper extremity weakness and severe guarding of cervical Movement. MRi showed C5-6 Discitis with epidural abscess. He was admitted 06/23/2012 when the results of The MRI were available. ID consult and he was placed on IV antibiotics. Left arm weakness improved over the Next several days and has remained stable and normal. PICCU line was placed. Due to history of multisubstance Abuse he was discharged to [facility] in [place] to continue IV antibiotic treatment. Underwent follow up MRI 07/28/2012 which shows a increased size of prevertebral abscess now C3 to C6 with increasing kyphosis At C5-6. Persistant epidural abscess at C5-6. Due to worsening size of the prevertebral abscess and concern Of worsening Kyphosis at the C5-6 level I have recommended open Incision and Drainage and Debridement of The prevertebral abscess and biopsy for definitive organism. Plan to perform discectomy C5-6 with partial vertebrectomy and decompression with iliac crest bone graft harvest to restore lordosis at C5-6. Antibiotics were discontinued 08/06/2012 in Anticipation of biopsy to try to improve chances of growing the organism. NO ANTIBIOTICS SHOULD BE GIVEN AS A BIOPSY IS BEING DONE TO GROWN THE INFECTING ORGANISM.  Past Medical History  Diagnosis Date  . Hepatitis C infection   . Depression   . Anxiety   . Asthma     "as a child"  . Kidney stone     hx of  . Urinary tract infection     hx of  . Headache     "occas"  . Chronic neck pain     Past Surgical History  Procedure Laterality Date  . No past surgeries      History reviewed. No pertinent family  history. Social History:  reports that he has been smoking Cigarettes.  He has been smoking about 0.50 packs per day. He has never used smokeless tobacco. He reports that  drinks alcohol. He reports that he uses illicit drugs.  Allergies:  Allergies  Allergen Reactions  . Ace Inhibitors     Medications Prior to Admission  Medication Sig Dispense Refill  . acetaminophen (TYLENOL) 325 MG tablet Take 2 tablets (650 mg total) by mouth every 6 (six) hours as needed (or Fever >/= 101).      Marland Kitchen ALPRAZolam (XANAX) 0.5 MG tablet Take 1 tablet (0.5 mg total) by mouth 3 (three) times daily as needed for anxiety (q8 hrs prn).      Marland Kitchen alum & mag hydroxide-simeth (MAALOX/MYLANTA) 200-200-20 MG/5ML suspension Take 30 mLs by mouth every 6 (six) hours as needed (dyspepsia).  355 mL    . cyclobenzaprine (FLEXERIL) 10 MG tablet Take 1 tablet (10 mg total) by mouth 3 (three) times daily.  30 tablet    . ibuprofen (ADVIL,MOTRIN) 600 MG tablet Take 600 mg by mouth every 6 (six) hours as needed. For pain.      Marland Kitchen oxyCODONE (OXY IR/ROXICODONE) 5 MG immediate release tablet Take 1-2 tablets (5-10 mg total) by mouth every 4 (four) hours as needed.  30 tablet  0  . OxyCODONE (OXYCONTIN) 15 mg T12A Take 1 tablet (15 mg total) by mouth every 12 (twelve) hours.  30 tablet  0  . senna-docusate (SENOKOT-S) 8.6-50  MG per tablet Take 1 tablet by mouth at bedtime.      . sertraline (ZOLOFT) 100 MG tablet Take 100 mg by mouth daily.        Results for orders placed during the hospital encounter of 08/08/12 (from the past 48 hour(s))  SEDIMENTATION RATE     Status: None   Collection Time    08/08/12  7:40 AM      Result Value Range   Sed Rate 2  0 - 16 mm/hr  CBC WITH DIFFERENTIAL     Status: Abnormal   Collection Time    08/08/12  7:40 AM      Result Value Range   WBC 4.9  4.0 - 10.5 K/uL   RBC 5.01  4.22 - 5.81 MIL/uL   Hemoglobin 15.4  13.0 - 17.0 g/dL   HCT 16.1  09.6 - 04.5 %   MCV 85.6  78.0 - 100.0 fL   MCH 30.7   26.0 - 34.0 pg   MCHC 35.9  30.0 - 36.0 g/dL   RDW 40.9  81.1 - 91.4 %   Platelets 172  150 - 400 K/uL   Neutrophils Relative 34 (*) 43 - 77 %   Neutro Abs 1.6 (*) 1.7 - 7.7 K/uL   Lymphocytes Relative 52 (*) 12 - 46 %   Lymphs Abs 2.5  0.7 - 4.0 K/uL   Monocytes Relative 8  3 - 12 %   Monocytes Absolute 0.4  0.1 - 1.0 K/uL   Eosinophils Relative 6 (*) 0 - 5 %   Eosinophils Absolute 0.3  0.0 - 0.7 K/uL   Basophils Relative 0  0 - 1 %   Basophils Absolute 0.0  0.0 - 0.1 K/uL  COMPREHENSIVE METABOLIC PANEL     Status: Abnormal   Collection Time    08/08/12  7:40 AM      Result Value Range   Sodium 138  135 - 145 mEq/L   Potassium 3.7  3.5 - 5.1 mEq/L   Chloride 99  96 - 112 mEq/L   CO2 27  19 - 32 mEq/L   Glucose, Bld 93  70 - 99 mg/dL   BUN 18  6 - 23 mg/dL   Creatinine, Ser 7.82  0.50 - 1.35 mg/dL   Calcium 95.6  8.4 - 21.3 mg/dL   Total Protein 8.0  6.0 - 8.3 g/dL   Albumin 4.2  3.5 - 5.2 g/dL   AST 086 (*) 0 - 37 U/L   ALT 219 (*) 0 - 53 U/L   Alkaline Phosphatase 98  39 - 117 U/L   Total Bilirubin 0.8  0.3 - 1.2 mg/dL   GFR calc non Af Amer >90  >90 mL/min   GFR calc Af Amer >90  >90 mL/min   Comment:            The eGFR has been calculated     using the CKD EPI equation.     This calculation has not been     validated in all clinical     situations.     eGFR's persistently     <90 mL/min signify     possible Chronic Kidney Disease.  PROTIME-INR     Status: None   Collection Time    08/08/12  7:40 AM      Result Value Range   Prothrombin Time 13.1  11.6 - 15.2 seconds   INR 1.00  0.00 - 1.49  APTT  Status: Abnormal   Collection Time    08/08/12  7:40 AM      Result Value Range   aPTT 40 (*) 24 - 37 seconds   Comment:            IF BASELINE aPTT IS ELEVATED,     SUGGEST PATIENT RISK ASSESSMENT     BE USED TO DETERMINE APPROPRIATE     ANTICOAGULANT THERAPY.  MRSA PCR SCREENING     Status: None   Collection Time    08/08/12  7:40 AM      Result Value  Range   MRSA by PCR NEGATIVE  NEGATIVE   Comment:            The GeneXpert MRSA Assay (FDA     approved for NASAL specimens     only), is one component of a     comprehensive MRSA colonization     surveillance program. It is not     intended to diagnose MRSA     infection nor to guide or     monitor treatment for     MRSA infections.   No results found.  Review of Systems  Constitutional: Positive for weight loss. Negative for fever, chills, malaise/fatigue and diaphoresis.  HENT: Positive for sore throat and neck pain. Negative for hearing loss, ear pain, nosebleeds, congestion, tinnitus and ear discharge.   Eyes: Negative for blurred vision, double vision, photophobia, pain, discharge and redness.  Respiratory: Negative.  Negative for cough, hemoptysis, sputum production, shortness of breath, wheezing and stridor.   Cardiovascular: Negative.  Negative for chest pain, palpitations, orthopnea, claudication, leg swelling and PND.  Gastrointestinal: Negative for heartburn, nausea, vomiting, abdominal pain, diarrhea, constipation, blood in stool and melena.  Genitourinary: Negative.  Negative for dysuria, urgency, frequency, hematuria and flank pain.  Musculoskeletal: Positive for back pain. Negative for myalgias, joint pain and falls.  Skin: Negative.  Negative for itching and rash.  Neurological: Negative for dizziness, tingling, tremors, sensory change, speech change, focal weakness, seizures, loss of consciousness, weakness and headaches.  Endo/Heme/Allergies: Negative for environmental allergies and polydipsia. Does not bruise/bleed easily.  Psychiatric/Behavioral: Negative for depression, suicidal ideas, hallucinations, memory loss and substance abuse. The patient is not nervous/anxious and does not have insomnia.     Height 5\' 10"  (1.778 m), weight 68.04 kg (150 lb). Physical Exam  Constitutional: He is oriented to person, place, and time. He appears well-developed and  well-nourished. No distress.  HENT:  Head: Normocephalic and atraumatic.  Right Ear: External ear normal.  Left Ear: External ear normal.  Nose: Nose normal.  Mouth/Throat: Oropharyngeal exudate present.  Eyes: Conjunctivae and EOM are normal. Pupils are equal, round, and reactive to light. Right eye exhibits no discharge. Left eye exhibits no discharge. No scleral icterus.  Neck: No JVD present. No tracheal deviation present. No thyromegaly present.  Cardiovascular: Normal rate, regular rhythm and intact distal pulses.  Exam reveals no gallop and no friction rub.   No murmur heard. Respiratory: Effort normal and breath sounds normal. No stridor. No respiratory distress. He has no wheezes. He has no rales. He exhibits no tenderness.  GI: Soft. Bowel sounds are normal. He exhibits no distension and no mass. There is no tenderness. There is no rebound and no guarding.  Musculoskeletal: He exhibits edema and tenderness.  Lymphadenopathy:    He has cervical adenopathy.  Neurological: He is alert and oriented to person, place, and time. He has normal reflexes. He displays normal reflexes. No  cranial nerve deficit. He exhibits normal muscle tone. Coordination normal.  Skin: Skin is warm and dry. No rash noted. He is not diaphoretic. No erythema. No pallor.   Ortho Eval: Decreased ROM C-Spine by 50% all directions. Upper extremity motor is normal including shoulder shrug, shoulder abduction internal and external rotation, biceps, triceps, wrist dorsiflexion And wrist volar flexion strength. Finger extention and flexion strength and intrinsic motor strength is normal. No long track findings. No clonus or spasticity. No ataxia. Xray with 15 degree kyphosis at the C5-6 level with disc space loss at C5-6. Anterior prevertebral soft tissue swelling is evident and greater than with previous xrays.  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  Plan: OR for Incision and Drainage and Debridement of Prevertebral abscess and discectomy C5-6 with partial vertebrectomy To decompress epidural abscess with left iliac crest bone graft, to improve and restore lordosis C5-6. Patient was seen and examined in the preop holding area. There has been no interval  Change in this patient's exam preop  history and physical exam  Lab tests and images have been examined and reviewed.  The Risks benefits and alternative treatments have been discussed  extensively,questions answered.  The patient has elected to undergo the discussed surgical treatment. [name],[name] 08/08/2012, 2:04 PM

## 2012-08-09 LAB — CBC
MCV: 85.6 fL (ref 78.0–100.0)
Platelets: 142 10*3/uL — ABNORMAL LOW (ref 150–400)
RDW: 12.6 % (ref 11.5–15.5)
WBC: 4.5 10*3/uL (ref 4.0–10.5)

## 2012-08-09 LAB — BASIC METABOLIC PANEL
Calcium: 8.9 mg/dL (ref 8.4–10.5)
Chloride: 103 mEq/L (ref 96–112)
Creatinine, Ser: 0.8 mg/dL (ref 0.50–1.35)
GFR calc Af Amer: >90 mL/min (ref >90)
Sodium: 139 mEq/L (ref 135–145)

## 2012-08-09 NOTE — Care Management (Signed)
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     Action/Plan:   SNF   Anticipated DC Date:     Anticipated DC Plan:  SKILLED NURSING FACILITY  In-house referral  Clinical Social Worker      DC Planning Services  CM consult      Choice offered to / List presented to:             Status of service:  Completed, signed off Medicare Important Message given?   (If response is "NO", the following Medicare IM given date fields will be blank) Date Medicare IM given:   Date Additional Medicare IM given:    Discharge Disposition:    Per UR Regulation:    If discussed at Long Length of Stay Meetings, dates discussed:    Comments:  08/09/12 1507 Tymeeka Davis,RN,BSN [phone] Cm spoke with patient concerning MD order for CM consult for [facility] services. Pt states unable to care for self at home. Pt states wanting to return tp [facility] in [place]. CM to referr pt to CSW.

## 2012-08-09 NOTE — Progress Notes (Signed)
Subjective: 1 Day Post-Op Procedure(s) (LRB): Incision, drainage and debridement of C5-6 prevertebral and epidural abscess with fusion and left iliac crest bone graft (N/A) Patient reports pain as moderate.  Drain has been emptied at least twice with only mild drainage. Denies any other issues.  Reports that he is swallowing fine.  Objective: Vital signs in last 24 hours: Temp:  [97.6 F (36.4 C)-98.5 F (36.9 C)] 97.6 F (36.4 C) (03/09 0550) Pulse Rate:  [63-103] 74 (03/09 0550) Resp:  [13-22] 16 (03/09 0550) BP: (90-125)/(52-77) 100/68 mmHg (03/09 0550) SpO2:  [96 %-100 %] 99 % (03/09 0550) Weight:  [65.04 kg (143 lb 6.2 oz)] 65.04 kg (143 lb 6.2 oz) (03/08 1641)  Intake/Output from previous day: 03/08 0701 - 03/09 0700 In: 4265.5 [P.O.:720; I.V.:3495.5] Out: 830 [Urine:800; Drains:30] Intake/Output this shift:     Recent Labs  08/08/12 0740 08/09/12 0458  HGB 15.4 12.1*    Recent Labs  08/08/12 0740 08/09/12 0458  WBC 4.9 4.5  RBC 5.01 4.04*  HCT 42.9 34.6*  PLT 172 142*    Recent Labs  08/08/12 0740 08/09/12 0458  NA 138 139  K 3.7 3.7  CL 99 103  CO2 27 30  BUN 18 16  CREATININE 0.81 0.80  GLUCOSE 93 88  CALCIUM 10.1 8.9    Recent Labs  08/08/12 0740  INR 1.00    Neurologically intact Collar in place.  Dressing clean and dry. Grossly intact neurologic function upper and lower extremities.  Assessment/Plan: 1 Day Post-Op Procedure(s) (LRB): Incision, drainage and debridement of C5-6 prevertebral and epidural abscess with fusion and left iliac crest bone graft (N/A)  Continue IV antibiotcs. Appreciate ID consult following and recs. Donnis Phaneuf Y 08/09/2012, 8:27 AM

## 2012-08-09 NOTE — Evaluation (Signed)
Occupational Therapy Evaluation Patient Details Name: Christopher Berg MRN: [id number] DOB: 09-Apr-1981 Today's Date: 08/09/2012 Time: 4098-1191 OT Time Calculation (min): 12 min  OT Assessment / Plan / Recommendation Clinical Impression  Pt s/p Incision, drainage and debridement of C5-6 prevertebral and epidural abscess with fusion and left iliac crest bone graft.  Hx of multisubstance abuse.  Pt is functioning at overall sup/min guard level and plans to return to [facility] for antibiotic treatment. Pt with no further acute OT needs. Edcuation completed.    OT Assessment  All further OT needs can be met in the next venue of care    Follow Up Recommendations  Supervision/Assistance - 24 hour    Barriers to Discharge      Equipment Recommendations  None recommended by OT    Recommendations for Other Services    Frequency       Precautions / Restrictions Precautions Precautions: Cervical Precaution Comments: Educated pt on cervical precautions. Required Braces or Orthoses: Cervical Brace Cervical Brace: Hard collar ([place] collar for bathing)   Pertinent Vitals/Pain See vitals    ADL  Eating/Feeding: Performed;Independent Where Assessed - Eating/Feeding: Edge of bed Upper Body Dressing: Performed;Set up Where Assessed - Upper Body Dressing: Unsupported sitting Lower Body Dressing: Performed;Supervision/safety Where Assessed - Lower Body Dressing: Unsupported sitting Toilet Transfer: Simulated;Min guard Toilet Transfer Method: Sit to Barista:  (bed) Equipment Used:  (cervical collar) Transfers/Ambulation Related to ADLs: Min guard for ambulation in room and hall ADL Comments: Pt denies any numbness/weakness in bil UE.  Able to independently open ice cream and spoon packaging.  Pt able to cross ankles over knees but more difficulty with LLE due to pain (anticipate this will improve as pain decreases).    OT Diagnosis:    OT Problem List:   OT  Treatment Interventions:     OT Goals    Visit Information  Last OT Received On: 08/09/12 Assistance Needed: +1    Subjective Data      Prior Functioning     Home Living Available Help at Discharge: Skilled Nursing Facility Additional Comments: [facility]. Communication Communication: No difficulties Dominant Hand: Right         Vision/Perception     Cognition  Cognition Overall Cognitive Status: Appears within functional limits for tasks assessed/performed Arousal/Alertness: Awake/alert Orientation Level: Appears intact for tasks assessed Behavior During Session: [facility] for tasks performed    Extremity/Trunk Assessment Right Upper Extremity Assessment RUE ROM/Strength/Tone: WFL for tasks assessed RUE Sensation: WFL - Light Touch;WFL - Proprioception RUE Coordination: WFL - gross motor Left Upper Extremity Assessment LUE ROM/Strength/Tone: WFL for tasks assessed LUE Sensation: WFL - Light Touch;WFL - Proprioception LUE Coordination: WFL - gross/fine motor     Mobility Bed Mobility Bed Mobility: Supine to Sit;Sitting - Scoot to Edge of Bed;Sit to Supine Supine to Sit: 4: Min guard Sitting - Scoot to [place] of Bed: 6: Modified independent (Device/Increase time) Sit to Supine: 5: Supervision Transfers Transfers: Sit to Stand;Stand to Sit Sit to Stand: 4: Min guard;From bed Stand to Sit: 4: Min guard;To bed Details for Transfer Assistance: min guard for safety     Exercise     Balance     End of Session OT - End of Session Equipment Utilized During Treatment: Cervical collar Activity Tolerance: Patient limited by pain Patient left: in bed;with call bell/phone within reach Nurse Communication: Mobility status  GO    08/09/2012 Cipriano Mile  OTR/L Pager [phone] Office [phone]  Cipriano Mile 08/09/2012, 10:17 AM

## 2012-08-09 NOTE — Progress Notes (Signed)
INFECTIOUS DISEASE PROGRESS NOTE  ID: Christopher Berg is a 32 y.o. male with   Principal Problem:   Abscess in epidural space of cervical spine  Subjective: Cont neck pain  Abtx:  Anti-infectives   Start     Dose/Rate Route Frequency Ordered Stop   08/08/12 1800  vancomycin (VANCOCIN) IVPB 1000 mg/200 mL premix     1,000 mg 200 mL/hr over 60 Minutes Intravenous Every 8 hours 08/08/12 1638     08/08/12 1800  cefTRIAXone (ROCEPHIN) 1 g in dextrose 5 % 50 mL IVPB     1 g 100 mL/hr over 30 Minutes Intravenous Every 12 hours 08/08/12 1638        Medications:  Scheduled: . cefTRIAXone (ROCEPHIN)  IV  1 g Intravenous Q12H  . cyclobenzaprine  10 mg Oral TID  . docusate sodium  100 mg Oral BID  . OxyCODONE  20 mg Oral Q12H  . senna  1 tablet Oral BID  . sertraline  100 mg Oral Daily  . sodium chloride  10-40 mL Intracatheter Q12H  . sodium chloride  3 mL Intravenous Q12H  . vancomycin  1,000 mg Intravenous Q8H    Objective: Vital signs in last 24 hours: Temp:  [97.6 F (36.4 C)-98.5 F (36.9 C)] 97.6 F (36.4 C) (03/09 0550) Pulse Rate:  [63-103] 74 (03/09 0550) Resp:  [13-22] 16 (03/09 0550) BP: (90-125)/(52-77) 100/68 mmHg (03/09 0550) SpO2:  [96 %-100 %] 99 % (03/09 0550) Weight:  [65.04 kg (143 lb 6.2 oz)] 65.04 kg (143 lb 6.2 oz) (03/08 1641)   General appearance: alert, cooperative and no distress Neck: drain in L neck Resp: clear to auscultation bilaterally Cardio: regular rate and rhythm GI: normal findings: bowel sounds normal and soft, non-tender  Lab Results  Recent Labs  08/08/12 0740 08/09/12 0458  WBC 4.9 4.5  HGB 15.4 12.1*  HCT 42.9 34.6*  NA 138 139  K 3.7 3.7  CL 99 103  CO2 27 30  BUN 18 16  CREATININE 0.81 0.80   Liver Panel  Recent Labs  08/08/12 0740  PROT 8.0  ALBUMIN 4.2  AST 102*  ALT 219*  ALKPHOS 98  BILITOT 0.8   Sedimentation Rate  Recent Labs  08/08/12 0740  ESRSEDRATE 2   C-Reactive Protein No results  found for this basename: CRP,  in the last 72 hours  Microbiology: Recent Results (from the past 240 hour(s))  MRSA PCR SCREENING     Status: None   Collection Time    08/08/12  7:40 AM      Result Value Range Status   MRSA by PCR NEGATIVE  NEGATIVE Final   Comment:            The GeneXpert MRSA Assay (FDA     approved for NASAL specimens     only), is one component of a     comprehensive MRSA colonization     surveillance program. It is not     intended to diagnose MRSA     infection nor to guide or     monitor treatment for     MRSA infections.  CULTURE, ROUTINE-ABSCESS     Status: None   Collection Time    08/08/12 12:00 PM      Result Value Range Status   Specimen Description 6 ABSCESS LEFT NECK C5   Final   Special Requests NONE   Final   Gram Stain PENDING   Incomplete   Culture NO  GROWTH 1 DAY   Final   Report Status PENDING   Incomplete  GRAM STAIN     Status: None   Collection Time    08/08/12 12:00 PM      Result Value Range Status   Specimen Description ABSCESS LEFT NECK C5 6   Final   Special Requests NONE   Final   Gram Stain     Final   Value: NO WBC SEEN     NO ORGANISMS SEEN   Report Status 08/08/2012 FINAL   Final  TISSUE CULTURE     Status: None   Collection Time    08/08/12  1:32 PM      Result Value Range Status   Specimen Description TISSUE PREVERTEBRAL C5 6   Final   Special Requests NONE   Final   Gram Stain PENDING   Incomplete   Culture NO GROWTH 1 DAY   Final   Report Status PENDING   Incomplete  GRAM STAIN     Status: None   Collection Time    08/08/12  1:32 PM      Result Value Range Status   Specimen Description TISSUE PREVERTEBRAL C 5 6   Final   Special Requests NONE   Final   Gram Stain     Final   Value: NO WBC SEEN     NO ORGANISMS SEEN   Report Status 08/08/2012 FINAL   Final    Studies/Results: Dg Cervical Spine 2 Or 3 Views  08/08/2012  *RADIOLOGY REPORT*  Clinical Data: C5-C6 graft.  DG C-ARM 1-60 MIN,CERVICAL SPINE -  2-3 VIEW  Comparison:  MRI of 07/28/2012  Findings: 3 intraoperative views.  The first demonstrates a surgical device projecting over the C5-C6 interspace.  The second image is a lateral which images through the top of C7.  The third image is an attempted frontal.  Extensive artifact degradation.  Nondiagnostic.  IMPRESSION: Intraoperative imaging.   Original Report Authenticated By: Jeronimo Greaves, M.D.    Dg C-arm 1-60 Min  08/08/2012  *RADIOLOGY REPORT*  Clinical Data: C5-C6 graft.  DG C-ARM 1-60 MIN,CERVICAL SPINE - 2-3 VIEW  Comparison:  MRI of 07/28/2012  Findings: 3 intraoperative views.  The first demonstrates a surgical device projecting over the C5-C6 interspace.  The second image is a lateral which images through the top of C7.  The third image is an attempted frontal.  Extensive artifact degradation.  Nondiagnostic.  IMPRESSION: Intraoperative imaging.   Original Report Authenticated By: Jeronimo Greaves, M.D.      Assessment/Plan: Epidural Abscess  I & D, bone graft 3-8 Hep C  Total days of antibiotics: 47 (vanco/imipenem)  Cx stain so far unrevealing.... Change diet to regular         [name] Infectious Diseases [phone] 08/09/2012, 11:58 AM   LOS: 1 day

## 2012-08-09 NOTE — Evaluation (Signed)
Physical Therapy Evaluation Patient Details Name: Christopher Berg MRN: [id number] DOB: 08-12-1980 Today's Date: 08/09/2012 Time: 4098-1191 PT Time Calculation (min): 14 min  PT Assessment / Plan / Recommendation Clinical Impression  pt presents with C5-6 I+D and Fusion 2/2 Discitis and Epidural Abscess.  pt needs encouragement to participate, but anticipate good progress.  pt notes plan is to return to [facility] to complete 6wks of IV antibiotics.      PT Assessment  Patient needs continued PT services    Follow Up Recommendations  SNF (Return to SNF)    Does the patient have the potential to tolerate intense rehabilitation      Barriers to Discharge None      Equipment Recommendations  None recommended by PT    Recommendations for Other Services     Frequency Min 5X/week    Precautions / Restrictions Precautions Precautions: Cervical Precaution Comments: Educated pt on cervical precautions. Required Braces or Orthoses: Cervical Brace Cervical Brace: Hard collar ([place] collar for bathing) Restrictions Weight Bearing Restrictions: No   Pertinent Vitals/Pain Did not rate, but stated "It hurts bad."  RN made aware.        Mobility  Bed Mobility Bed Mobility: Supine to Sit;Sitting - Scoot to Edge of Bed;Sit to Supine Supine to Sit: 4: Min guard Sitting - Scoot to [place] of Bed: 6: Modified independent (Device/Increase time) Sit to Supine: 5: Supervision Transfers Transfers: Sit to Stand;Stand to Sit Sit to Stand: 4: Min guard;From bed Stand to Sit: 4: Min guard;To bed Details for Transfer Assistance: min guard for safety Ambulation/Gait Ambulation/Gait Assistance: 4: Min guard Ambulation Distance (Feet): 250 Feet Assistive device:  (IV pole) Ambulation/Gait Assistance Details: pt indicates pain in L hip from bone graft requiring him to hold IV pole.   Gait Pattern: Antalgic Stairs: No Wheelchair Mobility Wheelchair Mobility: No    Exercises     PT  Diagnosis: Difficulty walking  PT Problem List: Decreased activity tolerance;Decreased balance;Decreased mobility;Decreased knowledge of precautions;Pain PT Treatment Interventions: DME instruction;Gait training;Stair training;Functional mobility training;Therapeutic activities;Therapeutic exercise;Balance training;Patient/family education   PT Goals Acute Rehab PT Goals PT Goal Formulation: With patient Time For Goal Achievement: 08/16/12 Potential to Achieve Goals: Good Pt will go Supine/Side to Sit: with modified independence PT Goal: Supine/Side to Sit - Progress: Goal set today Pt will go Sit to Supine/Side: with modified independence PT Goal: Sit to Supine/Side - Progress: Goal set today Pt will go Sit to Stand: with modified independence PT Goal: Sit to Stand - Progress: Goal set today Pt will go Stand to Sit: with modified independence PT Goal: Stand to Sit - Progress: Goal set today Pt will Ambulate: >150 feet;with modified independence;with rolling walker PT Goal: Ambulate - Progress: Goal set today  Visit Information  Last PT Received On: 08/09/12 Assistance Needed: +1 PT/OT Co-Evaluation/Treatment: Yes    Subjective Data  Subjective: I've been staying at that NH.   Patient Stated Goal: Get better.     Prior Functioning  Home Living Available Help at Discharge: Skilled Nursing Facility Additional Comments: Pt returning to  [facility]. Communication Communication: No difficulties Dominant Hand: Right    Cognition  Cognition Overall Cognitive Status: Appears within functional limits for tasks assessed/performed Arousal/Alertness: Awake/alert Orientation Level: Appears intact for tasks assessed Behavior During Session: [facility] for tasks performed    Extremity/Trunk Assessment Right Upper Extremity Assessment RUE ROM/Strength/Tone: [facility] for tasks assessed RUE Sensation: WFL - Light Touch;WFL - Proprioception RUE Coordination: WFL - gross  motor Left Upper Extremity Assessment LUE ROM/Strength/Tone: WFL for tasks assessed LUE Sensation: WFL - Light Touch;WFL - Proprioception LUE Coordination: WFL - gross/fine motor Right Lower Extremity Assessment RLE ROM/Strength/Tone: WFL for tasks assessed RLE Sensation: WFL - Light Touch Left Lower Extremity Assessment LLE ROM/Strength/Tone: WFL for tasks assessed LLE Sensation: WFL - Light Touch Trunk Assessment Trunk Assessment: Normal   Balance Balance Balance Assessed: No  End of Session PT - End of Session Equipment Utilized During Treatment: Cervical collar Activity Tolerance: Patient tolerated treatment well Patient left: in bed;with call bell/phone within reach Nurse Communication: Mobility status  GP     Sunny Schlein, [place] [phone] 08/09/2012, 10:25 AM

## 2012-08-10 ENCOUNTER — Inpatient Hospital Stay (HOSPITAL_COMMUNITY): Payer: PRIVATE HEALTH INSURANCE

## 2012-08-10 ENCOUNTER — Encounter (HOSPITAL_COMMUNITY): Payer: Self-pay | Admitting: Specialist

## 2012-08-10 DIAGNOSIS — G061 Intraspinal abscess and granuloma: Secondary | ICD-10-CM

## 2012-08-10 MED ORDER — SODIUM CHLORIDE 0.9 % IV SOLN
500.0000 mg | Freq: Four times a day (QID) | INTRAVENOUS | Status: DC
  Administered 2012-08-10 – 2012-08-11 (×3): 500 mg via INTRAVENOUS
  Filled 2012-08-10 (×5): qty 500

## 2012-08-10 MED FILL — Mupirocin Oint 2%: CUTANEOUS | Qty: 22 | Status: AC

## 2012-08-10 NOTE — Progress Notes (Signed)
[facility]  Date of Admission:  08/08/2012  Antibiotics: Vancomycin/Rocephin 1/21 - 3/9  Subjective: Feels better  Objective: Temp:  [97.5 F (36.4 C)-98.3 F (36.8 C)] 98.2 F (36.8 C) (03/10 1300) Pulse Rate:  [70-85] 85 (03/10 1300) Resp:  [18] 18 (03/10 1300) BP: (95-117)/(63-68) 117/68 mmHg (03/10 1300) SpO2:  [95 %-100 %] 95 % (03/10 1300)  General: Awake, alert, oriented x 3, neck collar in place Skin: no rashes, + multiple tattoes Lungs: CTA B Cor: RRR without m/r/g Abdomen: soft, ntnd   Lab Results Lab Results  Component Value Date   WBC 4.5 08/09/2012   HGB 12.1* 08/09/2012   HCT 34.6* 08/09/2012   MCV 85.6 08/09/2012   PLT 142* 08/09/2012    Lab Results  Component Value Date   CREATININE 0.80 08/09/2012   BUN 16 08/09/2012   NA 139 08/09/2012   K 3.7 08/09/2012   CL 103 08/09/2012   CO2 30 08/09/2012    Lab Results  Component Value Date   ALT 219* 08/08/2012   AST 102* 08/08/2012   ALKPHOS 98 08/08/2012   BILITOT 0.8 08/08/2012      Microbiology: Recent Results (from the past 240 hour(s))  MRSA PCR SCREENING     Status: None   Collection Time    08/08/12  7:40 AM      Result Value Range Status   MRSA by PCR NEGATIVE  NEGATIVE Final   Comment:            The GeneXpert MRSA Assay (FDA     approved for NASAL specimens     only), is one component of a     comprehensive MRSA colonization     surveillance program. It is not     intended to diagnose MRSA     infection nor to guide or     monitor treatment for     MRSA infections.  CULTURE, ROUTINE-ABSCESS     Status: None   Collection Time    08/08/12 12:00 PM      Result Value Range Status   Specimen Description 6 ABSCESS LEFT NECK C5   Final   Special Requests NONE   Final   Gram Stain     Final   Value: NO WBC SEEN     NO ORGANISMS SEEN   Culture RARE GRAM NEGATIVE RODS   Final   Report Status PENDING   Incomplete  ANAEROBIC CULTURE     Status: None   Collection Time   08/08/12 12:00 PM      Result Value Range Status   Specimen Description ABSCESS LEFT NECK C5 6   Final   Special Requests NONE   Final   Gram Stain     Final   Value: NO WBC SEEN     NO ORGANISMS SEEN   Culture     Final   Value: NO ANAEROBES ISOLATED; CULTURE IN PROGRESS FOR 5 DAYS   Report Status PENDING   Incomplete  GRAM STAIN     Status: None   Collection Time    08/08/12 12:00 PM      Result Value Range Status   Specimen Description ABSCESS LEFT NECK C5 6   Final   Special Requests NONE   Final   Gram Stain     Final   Value: NO WBC SEEN     NO ORGANISMS SEEN   Report Status 08/08/2012 FINAL   Final  FUNGUS CULTURE  W SMEAR     Status: None   Collection Time    08/08/12 12:00 PM      Result Value Range Status   Specimen Description ABSCESS LEFT NECK C 5 6   Final   Special Requests NONE   Final   Fungal Smear NO YEAST OR FUNGAL ELEMENTS SEEN   Final   Culture CULTURE IN PROGRESS FOR FOUR WEEKS   Final   Report Status PENDING   Incomplete  AFB CULTURE WITH SMEAR     Status: None   Collection Time    08/08/12 12:00 PM      Result Value Range Status   Specimen Description ABSCESS LEFT NECK C 5 6   Final   Special Requests NONE   Final   ACID FAST SMEAR NO ACID FAST BACILLI SEEN   Final   Culture     Final   Value: CULTURE WILL BE EXAMINED FOR 6 WEEKS BEFORE ISSUING A FINAL REPORT   Report Status PENDING   Incomplete  TISSUE CULTURE     Status: None   Collection Time    08/08/12  1:32 PM      Result Value Range Status   Specimen Description TISSUE PREVERTEBRAL C5 6   Final   Special Requests NONE   Final   Gram Stain     Final   Value: NO WBC SEEN     NO ORGANISMS SEEN   Culture RARE GRAM NEGATIVE RODS   Final   Report Status PENDING   Incomplete  ANAEROBIC CULTURE     Status: None   Collection Time    08/08/12  1:32 PM      Result Value Range Status   Specimen Description TISSUE PREVERTEBRAL C5 6   Final   Special Requests NONE   Final   Gram Stain PENDING    Incomplete   Culture     Final   Value: NO ANAEROBES ISOLATED; CULTURE IN PROGRESS FOR 5 DAYS   Report Status PENDING   Incomplete  AFB CULTURE WITH SMEAR     Status: None   Collection Time    08/08/12  1:32 PM      Result Value Range Status   Specimen Description TISSUE PREVERTABRAL C5 6   Final   Special Requests NONE   Final   ACID FAST SMEAR NO ACID FAST BACILLI SEEN   Final   Culture     Final   Value: CULTURE WILL BE EXAMINED FOR 6 WEEKS BEFORE ISSUING A FINAL REPORT   Report Status PENDING   Incomplete  FUNGUS CULTURE W SMEAR     Status: None   Collection Time    08/08/12  1:32 PM      Result Value Range Status   Specimen Description TISSUE PREVERTABRAL C5 6   Final   Special Requests NONE   Final   Fungal Smear NO YEAST OR FUNGAL ELEMENTS SEEN   Final   Culture CULTURE IN PROGRESS FOR FOUR WEEKS   Final   Report Status PENDING   Incomplete  GRAM STAIN     Status: None   Collection Time    08/08/12  1:32 PM      Result Value Range Status   Specimen Description TISSUE PREVERTEBRAL C 5 6   Final   Special Requests NONE   Final   Gram Stain     Final   Value: NO WBC SEEN     NO ORGANISMS SEEN   Report  Status 08/08/2012 FINAL   Final    Studies/Results: Dg Cervical Spine 2 Or 3 Views  08/10/2012  *RADIOLOGY REPORT*  Clinical Data: Diskitis and osteomyelitis.  CERVICAL SPINE - 2-3 VIEW  Comparison: Intraoperative radiographs dated 08/08/2012  Findings: The patient has undergone partial vertebrectomies at C5-6 with iliac crest bone graft insertion.  There is prevertebral soft tissue swelling.  Soft tissue drain in place to the left of midline.  Alignment of the cervical vertebra is normal.  Bone graft appears in good position.  IMPRESSION: Postsurgical changes as described.  Soft tissue drain in place with prevertebral soft tissue swelling.   Original Report Authenticated By: Francene Boyers, M.D.     Assessment/Plan: 1) discitis, osteo with prevertebral abscess - s.p  drainage.  Now growing GNR in  2 different cultures.  Since he was on Rocephin, presumably resistant or not covered by Rocephin and certainly with the history of drug abuse, the possibilities are broad.   -I have changed to imipenem pending ID of organism. -he is amenable to return to the [facility] for further IV antibiotics.   Staci Righter, MD [facility] [phone] pager   08/10/2012, 4:18 PM

## 2012-08-10 NOTE — Progress Notes (Signed)
Subjective: 2 Days Post-Op Procedure(s) (LRB): Incision, drainage and debridement of C5-6 prevertebral and epidural abscess with fusion and left iliac crest bone graft (N/A) Patient reports pain as mild. Throat more sore today and feels he may have tried to eat too soon.  Ate 2 slices of bacon this am.  Feels things are getting "stuck".  no choking or coughing. No arm pain.  Complain of numbness at tip of chin. Wearing collar without difficulty.   Objective: Vital signs in last 24 hours: Temp:  [97.5 F (36.4 C)-98.3 F (36.8 C)] 97.5 F (36.4 C) (03/10 0600) Pulse Rate:  [70-83] 70 (03/10 0600) Resp:  [16-18] 18 (03/10 0600) BP: (95-110)/(61-67) 95/63 mmHg (03/10 0600) SpO2:  [97 %-100 %] 100 % (03/10 0600)  Intake/Output from previous day: 03/09 0701 - 03/10 0700 In: 1200 [P.O.:600; IV Piggyback:600] Out: 29 [Drains:29] Intake/Output this shift:     Recent Labs  08/08/12 0740 08/09/12 0458  HGB 15.4 12.1*    Recent Labs  08/08/12 0740 08/09/12 0458  WBC 4.9 4.5  RBC 5.01 4.04*  HCT 42.9 34.6*  PLT 172 142*    Recent Labs  08/08/12 0740 08/09/12 0458  NA 138 139  K 3.7 3.7  CL 99 103  CO2 27 30  BUN 18 16  CREATININE 0.81 0.80  GLUCOSE 93 88  CALCIUM 10.1 8.9    Recent Labs  08/08/12 0740  INR 1.00    Neurovascular intact Incision: no drainage drain with 10cc bloody drainage this am.  will continure with drain until no drainage per Dr Otelia Sergeant.  drain sewn in.  Assessment/Plan: 2 Days Post-Op Procedure(s) (LRB): Incision, drainage and debridement of C5-6 prevertebral and epidural abscess with fusion and left iliac crest bone graft (N/A) Continue IV abx.  Awaiting cultures.  ID consult appreciated.  Will await their advise for final abx needed at discharge Pt wishes to go back to the same facility at dc. Will change diet back to clears for now and monitor swallowing.  VERNON,SHEILA M 08/10/2012, 8:59 AM

## 2012-08-10 NOTE — Progress Notes (Signed)
Physical Therapy Treatment Patient Details Name: Christopher Berg MRN: [id number] DOB: [birth year] Today's Date: 08/10/2012 Time: 7829-5621 PT Time Calculation (min): 11 min  PT Assessment / Plan / Recommendation Comments on Treatment Session  Pt presents with C5-C6 I + D, discitis and epidural abscess. Required max encouragement to participate in therapy. Agreeable to a walk.  Pt amb at supervision level, encouraged to continue mobility throughout the day with nursing. C/o intermittent pain in throat.  Recommend return to SNF for continued therapy upon acute D/C .    Follow Up Recommendations  SNF     Does the patient have the potential to tolerate intense rehabilitation     Barriers to Discharge        Equipment Recommendations  None recommended by PT    Recommendations for Other Services    Frequency Min 5X/week   Plan Discharge plan remains appropriate;Frequency remains appropriate    Precautions / Restrictions Precautions Precautions: Cervical Precaution Comments: Educated pt on cervical precautions. Required Braces or Orthoses: Cervical Brace Cervical Brace: Hard collar ([place] collar for bathing) Restrictions Weight Bearing Restrictions: No   Pertinent Vitals/Pain Intermittent pain in throat.     Mobility  Bed Mobility Bed Mobility: Supine to Sit;Sitting - Scoot to Edge of Bed;Sit to Supine Supine to Sit: 5: Supervision;With rails Sitting - Scoot to Edge of Bed: 6: Modified independent (Device/Increase time) Sit to Supine: 5: Supervision Details for Bed Mobility Assistance: requires use of handrails and cues for safety Transfers Transfers: Sit to Stand;Stand to Sit Sit to Stand: 5: Supervision;From bed;With upper extremity assist Stand to Sit: 5: Supervision;To bed;With upper extremity assist Details for Transfer Assistance: vc's for hand placement and safety Ambulation/Gait Ambulation/Gait Assistance: 5: Supervision Ambulation Distance (Feet): 250  Feet Assistive device: Other (Comment) (IV pole) Gait Pattern: Decreased stride length;Narrow base of support Gait velocity: decreased Stairs: No Wheelchair Mobility Wheelchair Mobility: No    Exercises     PT Diagnosis:    PT Problem List:   PT Treatment Interventions:     PT Goals Acute Rehab PT Goals PT Goal Formulation: With patient Time For Goal Achievement: 08/16/12 Potential to Achieve Goals: Good PT Goal: Supine/Side to Sit - Progress: Progressing toward goal PT Goal: Sit to Supine/Side - Progress: Progressing toward goal PT Goal: Sit to Stand - Progress: Progressing toward goal PT Goal: Stand to Sit - Progress: Progressing toward goal PT Goal: Ambulate - Progress: Progressing toward goal  Visit Information  Last PT Received On: 08/10/12 Assistance Needed: +1    Subjective Data  Subjective: "I dont feel like doing much today" Agreed to go for a walk. "on/off pain in throat"   Cognition  Cognition Overall Cognitive Status: Appears within functional limits for tasks assessed/performed Arousal/Alertness: Awake/alert Orientation Level: Appears intact for tasks assessed Behavior During Session: [facility] for tasks performed    Balance  Balance Balance Assessed: No  End of Session PT - End of Session Equipment Utilized During Treatment: Cervical collar Activity Tolerance: Patient tolerated treatment well Patient left: in bed;with call bell/phone within reach Nurse Communication: Mobility status   GP     Christopher Berg, [place] [phone] 08/10/2012, 11:43 AM

## 2012-08-10 NOTE — Clinical Social Work Psychosocial (Signed)
Clinical Social Work Department  BRIEF PSYCHOSOCIAL ASSESSMENT  Patient: Christopher Berg  Account Number: [id number]  Admit date: 08/10/2012 Clinical Social Worker Sabino Niemann, MSW Date/Time: 08/10/2012  3:30 PM Referred by: Physician Date Referred: 08/10/2012 Referred for   SNF Placement - returning to SNF  Other Referral:  Interview type: Patient  Other interview type: PSYCHOSOCIAL DATA  Living Status: Alone Admitted from facility:  Level of care:  Primary support name: Stuck,Daniel Primary support relationship to patient: father Degree of support available:  Strong and vested  CURRENT CONCERNS  Current Concerns   Post-Acute Placement   Other Concerns:  SOCIAL WORK ASSESSMENT / PLAN  CSW met with pt re: PT recommendation for SNF. Patient is from SNF - [facility]   Pt live alone  CSW explained placement process and answered questions.   Pt reports he is agreeable to returning to sNf once he is medically stable   CSW completed FL2 and sent clinicals to [facility].     Assessment/plan status: Information/Referral to [facility]  Other assessment/ plan:  Information/referral to community resources:  SNF   PTAR   PATIENT'S/FAMILY'S RESPONSE TO PLAN OF CARE:  Pt  Reports he  is agreeable to ST SNF in order to increase strength and independence with mobility prior to return home  Pt verbalized understanding of placement process and appreciation for CSW assist.   Sabino Niemann, MSW [phone]

## 2012-08-10 NOTE — Progress Notes (Signed)
UR COMPLETED  

## 2012-08-10 NOTE — Progress Notes (Signed)
ANTIBIOTIC CONSULT NOTE - Pharmacy Consult for imipenem Indication: diskitis, s/p I+D, epidural abscess  No Known Allergies  Patient Measurements: Height: 5\' 10"  (177.8 cm) Weight: 143 lb 6.2 oz (65.04 kg) IBW/kg (Calculated) : 73  Labs:  Recent Labs  08/08/12 0740 08/09/12 0458  WBC 4.9 4.5  HGB 15.4 12.1*  PLT 172 142*  CREATININE 0.81 0.80   Estimated Creatinine Clearance: 124.1 ml/min (by C-G formula based on Cr of 0.8). No results found for this basename: VANCOTROUGH, Leodis Binet, VANCORANDOM, GENTTROUGH, GENTPEAK, GENTRANDOM, TOBRATROUGH, TOBRAPEAK, TOBRARND, AMIKACINPEAK, AMIKACINTROU, AMIKACIN,  in the last 72 hours   Microbiology: Recent Results (from the past 720 hour(s))  MRSA PCR SCREENING     Status: None   Collection Time    08/08/12  7:40 AM      Result Value Range Status   MRSA by PCR NEGATIVE  NEGATIVE Final   Comment:            The GeneXpert MRSA Assay (FDA     approved for NASAL specimens     only), is one component of a     comprehensive MRSA colonization     surveillance program. It is not     intended to diagnose MRSA     infection nor to guide or     monitor treatment for     MRSA infections.  CULTURE, ROUTINE-ABSCESS     Status: None   Collection Time    08/08/12 12:00 PM      Result Value Range Status   Specimen Description 6 ABSCESS LEFT NECK C5   Final   Special Requests NONE   Final   Gram Stain     Final   Value: NO WBC SEEN     NO ORGANISMS SEEN   Culture RARE GRAM NEGATIVE RODS   Final   Report Status PENDING   Incomplete  ANAEROBIC CULTURE     Status: None   Collection Time    08/08/12 12:00 PM      Result Value Range Status   Specimen Description ABSCESS LEFT NECK C5 6   Final   Special Requests NONE   Final   Gram Stain     Final   Value: NO WBC SEEN     NO ORGANISMS SEEN   Culture     Final   Value: NO ANAEROBES ISOLATED; CULTURE IN PROGRESS FOR 5 DAYS   Report Status PENDING   Incomplete  GRAM STAIN     Status: None    Collection Time    08/08/12 12:00 PM      Result Value Range Status   Specimen Description ABSCESS LEFT NECK C5 6   Final   Special Requests NONE   Final   Gram Stain     Final   Value: NO WBC SEEN     NO ORGANISMS SEEN   Report Status 08/08/2012 FINAL   Final  FUNGUS CULTURE W SMEAR     Status: None   Collection Time    08/08/12 12:00 PM      Result Value Range Status   Specimen Description ABSCESS LEFT NECK C 5 6   Final   Special Requests NONE   Final   Fungal Smear NO YEAST OR FUNGAL ELEMENTS SEEN   Final   Culture CULTURE IN PROGRESS FOR FOUR WEEKS   Final   Report Status PENDING   Incomplete  AFB CULTURE WITH SMEAR     Status: None   Collection Time  08/08/12 12:00 PM      Result Value Range Status   Specimen Description ABSCESS LEFT NECK C 5 6   Final   Special Requests NONE   Final   ACID FAST SMEAR NO ACID FAST BACILLI SEEN   Final   Culture     Final   Value: CULTURE WILL BE EXAMINED FOR 6 WEEKS BEFORE ISSUING A FINAL REPORT   Report Status PENDING   Incomplete  TISSUE CULTURE     Status: None   Collection Time    08/08/12  1:32 PM      Result Value Range Status   Specimen Description TISSUE PREVERTEBRAL C5 6   Final   Special Requests NONE   Final   Gram Stain     Final   Value: NO WBC SEEN     NO ORGANISMS SEEN   Culture RARE GRAM NEGATIVE RODS   Final   Report Status PENDING   Incomplete  ANAEROBIC CULTURE     Status: None   Collection Time    08/08/12  1:32 PM      Result Value Range Status   Specimen Description TISSUE PREVERTEBRAL C5 6   Final   Special Requests NONE   Final   Gram Stain PENDING   Incomplete   Culture     Final   Value: NO ANAEROBES ISOLATED; CULTURE IN PROGRESS FOR 5 DAYS   Report Status PENDING   Incomplete  AFB CULTURE WITH SMEAR     Status: None   Collection Time    08/08/12  1:32 PM      Result Value Range Status   Specimen Description TISSUE PREVERTABRAL C5 6   Final   Special Requests NONE   Final   ACID FAST SMEAR NO  ACID FAST BACILLI SEEN   Final   Culture     Final   Value: CULTURE WILL BE EXAMINED FOR 6 WEEKS BEFORE ISSUING A FINAL REPORT   Report Status PENDING   Incomplete  FUNGUS CULTURE W SMEAR     Status: None   Collection Time    08/08/12  1:32 PM      Result Value Range Status   Specimen Description TISSUE PREVERTABRAL C5 6   Final   Special Requests NONE   Final   Fungal Smear NO YEAST OR FUNGAL ELEMENTS SEEN   Final   Culture CULTURE IN PROGRESS FOR FOUR WEEKS   Final   Report Status PENDING   Incomplete  GRAM STAIN     Status: None   Collection Time    08/08/12  1:32 PM      Result Value Range Status   Specimen Description TISSUE PREVERTEBRAL C 5 6   Final   Special Requests NONE   Final   Gram Stain     Final   Value: NO WBC SEEN     NO ORGANISMS SEEN   Report Status 08/08/2012 FINAL   Final    Assessment: 32 year old with diskitis.  S/p I+D of C5-6 prevertebral and epidural abscess with fusion and left ilac chest bone graft.  He has been on vancomycin and ceftriaxone for 48 days - started January 21.  Now 2 cultures growing GNRs.  Abx to change to imipenem per ID consult.  Wt 65 kg. Creat cl > 100 ml/min.   Goal of Therapy: eradication of infection  Plan:  1. Imipenem 500 mg IV q6h 2. F/u ID of GNRs 3. F/u renal function Michelle T.  Bell, Pharm.D. [phone] 08/10/2012 4:43 PM

## 2012-08-11 LAB — CULTURE, ROUTINE-ABSCESS
Gram Stain: NONE SEEN
Specimen Description: 6

## 2012-08-11 LAB — TISSUE CULTURE

## 2012-08-11 MED ORDER — CIPROFLOXACIN HCL 750 MG PO TABS
750.0000 mg | ORAL_TABLET | Freq: Two times a day (BID) | ORAL | Status: DC
  Administered 2012-08-11 – 2012-08-12 (×3): 750 mg via ORAL
  Filled 2012-08-11 (×5): qty 1

## 2012-08-11 NOTE — Progress Notes (Signed)
CARE MANAGEMENT NOTE 08/11/2012  Patient:  Christopher Berg, Christopher Berg   Account Number:  [id number]  Date Initiated:  08/11/2012  Documentation initiated by:  Vance Peper  Subjective/Objective Assessment:   32 yr old male s/p C5-6 decompression.     Action/Plan:   Patient had been resident at [facility] for antibiotic therapy. Will be able to discharge home on oral antibiotics. CM spoke with Maud Deed, PA concerning patient's ability to afford Cipro 750 mg BID tablets.   Anticipated DC Date:  08/13/2012   Anticipated DC Plan:  HOME/SELF CARE      DC Planning Services  CM consult  MATCH Program      Choice offered to / List presented to:             Status of service:  In process, will continue to follow Medicare Important Message given?   (If response is "NO", the following Medicare IM given date fields will be blank) Date Medicare IM given:   Date Additional Medicare IM given:    Discharge Disposition:    Per UR Regulation:    If discussed at Long Length of Stay Meetings, dates discussed:    Comments:  08/11/12 1100 Vance Peper, RN BSN Case Manager [phone] CM contacted pharmacist at [facility] to discuss cost for cipro. 750mg  tabs will cost $213.18 for 8 weeks supply.Cost would be less if Dr. ordered dose using 500mg  tabs. (patient will have to take 1.5 tabs bid). Patient unable to afford medication. CM will complete match program form.

## 2012-08-11 NOTE — Progress Notes (Signed)
Seen and agreed 08/11/2012 Robinette, Julia Elizabeth PTA [phone] pager [phone] office    

## 2012-08-11 NOTE — Progress Notes (Signed)
Xray reviewed ST swelling  Anterior neck, drain in place, graft is in good position and alignment Patient note and lab reviewed with [employer].

## 2012-08-11 NOTE — Progress Notes (Addendum)
Patient ID: Christopher Berg, male   DOB: 1980-12-31, 31 y.o.   MRN: [id number] CULTURES LOOK LIKE WILL BE PSEUDOMONAS.  COLLAR ON SOME DIFFICULTY SWALLOWING AS EXPECTED.   CONTINUE ABX   FL2 SIGNED.

## 2012-08-11 NOTE — Progress Notes (Signed)
[facility]  Date of Admission:  08/08/2012  Antibiotics: Vancomycin/Rocephin 1/21 - 3/9  Subjective: No pain at this time  Objective: Temp:  [97.4 F (36.3 C)-98.2 F (36.8 C)] 97.4 F (36.3 C) (03/11 0900) Pulse Rate:  [72-85] 80 (03/11 0900) Resp:  [18-20] 20 (03/11 0900) BP: (101-117)/(64-74) 103/64 mmHg (03/11 0900) SpO2:  [95 %-99 %] 96 % (03/11 0554)  General: Awake, alert, oriented x 3, neck collar in place Skin: no rashes, + multiple tattoes Lungs: CTA B Cor: RRR without m/r/g Abdomen: soft, ntnd   Lab Results Lab Results  Component Value Date   WBC 4.5 08/09/2012   HGB 12.1* 08/09/2012   HCT 34.6* 08/09/2012   MCV 85.6 08/09/2012   PLT 142* 08/09/2012    Lab Results  Component Value Date   CREATININE 0.80 08/09/2012   BUN 16 08/09/2012   NA 139 08/09/2012   K 3.7 08/09/2012   CL 103 08/09/2012   CO2 30 08/09/2012    Lab Results  Component Value Date   ALT 219* 08/08/2012   AST 102* 08/08/2012   ALKPHOS 98 08/08/2012   BILITOT 0.8 08/08/2012      Microbiology: Recent Results (from the past 240 hour(s))  MRSA PCR SCREENING     Status: None   Collection Time    08/08/12  7:40 AM      Result Value Range Status   MRSA by PCR NEGATIVE  NEGATIVE Final   Comment:            The GeneXpert MRSA Assay (FDA     approved for NASAL specimens     only), is one component of a     comprehensive MRSA colonization     surveillance program. It is not     intended to diagnose MRSA     infection nor to guide or     monitor treatment for     MRSA infections.  CULTURE, ROUTINE-ABSCESS     Status: None   Collection Time    08/08/12 12:00 PM      Result Value Range Status   Specimen Description 6 ABSCESS LEFT NECK C5   Final   Special Requests NONE   Final   Gram Stain     Final   Value: NO WBC SEEN     NO ORGANISMS SEEN   Culture RARE PSEUDOMONAS AERUGINOSA   Final   Report Status 08/11/2012 FINAL   Final   Organism ID, Bacteria PSEUDOMONAS AERUGINOSA    Final  ANAEROBIC CULTURE     Status: None   Collection Time    08/08/12 12:00 PM      Result Value Range Status   Specimen Description ABSCESS LEFT NECK C5 6   Final   Special Requests NONE   Final   Gram Stain     Final   Value: NO WBC SEEN     NO ORGANISMS SEEN   Culture     Final   Value: NO ANAEROBES ISOLATED; CULTURE IN PROGRESS FOR 5 DAYS   Report Status PENDING   Incomplete  GRAM STAIN     Status: None   Collection Time    08/08/12 12:00 PM      Result Value Range Status   Specimen Description ABSCESS LEFT NECK C5 6   Final   Special Requests NONE   Final   Gram Stain     Final   Value: NO WBC SEEN  NO ORGANISMS SEEN   Report Status 08/08/2012 FINAL   Final  FUNGUS CULTURE W SMEAR     Status: None   Collection Time    08/08/12 12:00 PM      Result Value Range Status   Specimen Description ABSCESS LEFT NECK C 5 6   Final   Special Requests NONE   Final   Fungal Smear NO YEAST OR FUNGAL ELEMENTS SEEN   Final   Culture CULTURE IN PROGRESS FOR FOUR WEEKS   Final   Report Status PENDING   Incomplete  AFB CULTURE WITH SMEAR     Status: None   Collection Time    08/08/12 12:00 PM      Result Value Range Status   Specimen Description ABSCESS LEFT NECK C 5 6   Final   Special Requests NONE   Final   ACID FAST SMEAR NO ACID FAST BACILLI SEEN   Final   Culture     Final   Value: CULTURE WILL BE EXAMINED FOR 6 WEEKS BEFORE ISSUING A FINAL REPORT   Report Status PENDING   Incomplete  TISSUE CULTURE     Status: None   Collection Time    08/08/12  1:32 PM      Result Value Range Status   Specimen Description TISSUE PREVERTEBRAL C5 6   Final   Special Requests NONE   Final   Gram Stain     Final   Value: NO WBC SEEN     NO ORGANISMS SEEN   Culture     Final   Value: RARE PSEUDOMONAS AERUGINOSA     Note: SUSCEPTIBILITIES PERFORMED ON PREVIOUS CULTURE WITHIN THE LAST 5 DAYS.   Report Status 08/11/2012 FINAL   Final  ANAEROBIC CULTURE     Status: None   Collection Time     08/08/12  1:32 PM      Result Value Range Status   Specimen Description TISSUE PREVERTEBRAL C5 6   Final   Special Requests NONE   Final   Gram Stain PENDING   Incomplete   Culture     Final   Value: NO ANAEROBES ISOLATED; CULTURE IN PROGRESS FOR 5 DAYS   Report Status PENDING   Incomplete  AFB CULTURE WITH SMEAR     Status: None   Collection Time    08/08/12  1:32 PM      Result Value Range Status   Specimen Description TISSUE PREVERTABRAL C5 6   Final   Special Requests NONE   Final   ACID FAST SMEAR NO ACID FAST BACILLI SEEN   Final   Culture     Final   Value: CULTURE WILL BE EXAMINED FOR 6 WEEKS BEFORE ISSUING A FINAL REPORT   Report Status PENDING   Incomplete  FUNGUS CULTURE W SMEAR     Status: None   Collection Time    08/08/12  1:32 PM      Result Value Range Status   Specimen Description TISSUE PREVERTABRAL C5 6   Final   Special Requests NONE   Final   Fungal Smear NO YEAST OR FUNGAL ELEMENTS SEEN   Final   Culture CULTURE IN PROGRESS FOR FOUR WEEKS   Final   Report Status PENDING   Incomplete  GRAM STAIN     Status: None   Collection Time    08/08/12  1:32 PM      Result Value Range Status   Specimen Description TISSUE PREVERTEBRAL C 5 6  Final   Special Requests NONE   Final   Gram Stain     Final   Value: NO WBC SEEN     NO ORGANISMS SEEN   Report Status 08/08/2012 FINAL   Final    Studies/Results: Dg Cervical Spine 2 Or 3 Views  08/10/2012  *RADIOLOGY REPORT*  Clinical Data: Diskitis and osteomyelitis.  CERVICAL SPINE - 2-3 VIEW  Comparison: Intraoperative radiographs dated 08/08/2012  Findings: The patient has undergone partial vertebrectomies at C5-6 with iliac crest bone graft insertion.  There is prevertebral soft tissue swelling.  Soft tissue drain in place to the left of midline.  Alignment of the cervical vertebra is normal.  Bone graft appears in good position.  IMPRESSION: Postsurgical changes as described.  Soft tissue drain in place with  prevertebral soft tissue swelling.   Original Report Authenticated By: Francene Boyers, M.D.     Assessment/Plan: 1) discitis, osteo with prevertebral abscess - s.p drainage.  pansensitive Pseudomonas.  This will be ameneable to oral therapy with cipro 750 mg twice a day for 8 weeks at least.   -we will follow up with him in April at [facility]  -I will have picc line removed  Staci Righter, MD [facility] [phone] pager   08/11/2012, 10:05 AM

## 2012-08-11 NOTE — Progress Notes (Signed)
Physical Therapy Treatment Patient Details Name: Christopher Berg MRN: [id number] DOB: 06/21/1980 Today's Date: 08/11/2012 Time: 4098-1191 PT Time Calculation (min): 18 min  PT Assessment / Plan / Recommendation Comments on Treatment Session  Pt was able to perform PT activities Mod I.      Follow Up Recommendations  SNF     Does the patient have the potential to tolerate intense rehabilitation     Barriers to Discharge        Equipment Recommendations  None recommended by PT    Recommendations for Other Services    Frequency Min 5X/week   Plan Discharge plan remains appropriate;Frequency remains appropriate    Precautions / Restrictions Precautions Precautions: Cervical Precaution Comments: Educated pt on cervical precautions. Required Braces or Orthoses: Cervical Brace Cervical Brace: Hard collar Restrictions Weight Bearing Restrictions: No   Pertinent Vitals/Pain Pt reported pain 9/10.  RN provided pain meds prior to session.    Mobility  Bed Mobility Bed Mobility: Supine to Sit Supine to Sit: 6: Modified independent (Device/Increase time) Transfers Transfers: Sit to Stand;Stand to Sit Sit to Stand: 6: Modified independent (Device/Increase time) Stand to Sit: 6: Modified independent (Device/Increase time) Ambulation/Gait Ambulation/Gait Assistance: 6: Modified independent (Device/Increase time) Ambulation Distance (Feet): 600 Feet Stairs: No Wheelchair Mobility Wheelchair Mobility: No    Exercises     PT Diagnosis:    PT Problem List:   PT Treatment Interventions:     PT Goals Acute Rehab PT Goals Time For Goal Achievement: 08/16/12 Potential to Achieve Goals: Good Pt will go Supine/Side to Sit: with modified independence PT Goal: Supine/Side to Sit - Progress: Progressing toward goal Pt will go Sit to Stand: with modified independence PT Goal: Sit to Stand - Progress: Progressing toward goal Pt will go Stand to Sit: with modified independence PT  Goal: Stand to Sit - Progress: Progressing toward goal Pt will Ambulate: >150 feet;with modified independence;with rolling walker PT Goal: Ambulate - Progress: Progressing toward goal  Visit Information  Last PT Received On: 08/11/12 Assistance Needed: +1    Subjective Data  Subjective: I want to go home.    Cognition  Cognition Overall Cognitive Status: Appears within functional limits for tasks assessed/performed Arousal/Alertness: Awake/alert Orientation Level: Appears intact for tasks assessed Behavior During Session: [facility] for tasks performed    Balance     End of Session PT - End of Session Equipment Utilized During Treatment: Cervical collar Activity Tolerance: Patient tolerated treatment well Patient left: in chair;with call bell/phone within reach   GP     Enid Baas, SPTA 08/11/2012, 10:31 AM

## 2012-08-11 NOTE — Progress Notes (Signed)
Subjective: 3 Days Post-Op Procedure(s) (LRB): Incision, drainage and debridement of C5-6 prevertebral and epidural abscess with fusion and left iliac crest bone graft (N/A) Patient reports pain as mild. More comfortable today.  Swallowing improving daily. Walking in hallway. Discussed disposition now that he can be managed on po meds.  He reports he will be staying with his mom and dad in [place]. He is concerned about the cost of the oral antibiotics.  I spoke with case manager who will assist with securing assistance with meds if needed.  Advised pt there will be no assistance with narcotic meds at discharge.  Currently no fever or chills.  Objective: Vital signs in last 24 hours: Temp:  [97.4 F (36.3 C)-98 F (36.7 C)] 98 F (36.7 C) (03/11 1300) Pulse Rate:  [72-82] 82 (03/11 1300) Resp:  [18-20] 20 (03/11 1300) BP: (101-117)/(64-78) 111/78 mmHg (03/11 1300) SpO2:  [96 %-100 %] 100 % (03/11 1300)  Intake/Output from previous day: 03/10 0701 - 03/11 0700 In: 900 [I.V.:900] Out: 15 [Drains:15] Intake/Output this shift:     Recent Labs  08/09/12 0458  HGB 12.1*    Recent Labs  08/09/12 0458  WBC 4.5  RBC 4.04*  HCT 34.6*  PLT 142*    Recent Labs  08/09/12 0458  NA 139  K 3.7  CL 103  CO2 30  BUN 16  CREATININE 0.80  GLUCOSE 88  CALCIUM 8.9   No results found for this basename: LABPT, INR,  in the last 72 hours  Neurovascular intact Sensation intact distally Incision: no drainage and JP drain resevoir with app 5 cc bloody drainage today.  report shows 15cc total yesterday.  will plan to remove tomorrow am and anticipate discharge if he is stable  Assessment/Plan: 3 Days Post-Op Procedure(s) (LRB): Incision, drainage and debridement of C5-6 prevertebral and epidural abscess with fusion and left iliac crest bone graft (N/A) Advance diet Plan for discharge tomorrow if pt stable  Will need Cipro 750mg  bid for 8 weeks per ID. Advised pt of plan    VERNON,SHEILA M 08/11/2012, 1:33 PM

## 2012-08-12 MED ORDER — CIPROFLOXACIN HCL 500 MG PO TABS: 750.0000 mg | ORAL_TABLET | Freq: Two times a day (BID) | ORAL | Status: DC

## 2012-08-12 MED ORDER — OXYCODONE-ACETAMINOPHEN 5-325 MG PO TABS: 1.0000 | ORAL_TABLET | ORAL | Status: DC | PRN

## 2012-08-12 NOTE — Progress Notes (Signed)
CARE MANAGEMENT NOTE 08/12/2012  Patient:  Christopher Berg, Christopher Berg   Account Number:  [id number]  Date Initiated:  08/11/2012  Documentation initiated by:  Vance Peper  Subjective/Objective Assessment:   31 yr old male s/p C5-6 decompression.     Action/Plan:   Patient had been resident at [facility] for [facility]. Will be able to discharge home on oral antibiotics. CM spoke with Maud Deed, PA concerning patient's ability to afford Cipro 750 mg BID tablets.   Anticipated DC Date:  08/12/2012   Anticipated DC Plan:  HOME/SELF CARE      DC Planning Services  CM consult      Choice offered to / List presented to:             Status of service:  Completed, signed off Medicare Important Message given?   (If response is "NO", the following Medicare IM given date fields will be blank) Date Medicare IM given:   Date Additional Medicare IM given:    Discharge Disposition:  HOME/SELF CARE  Per UR Regulation:    If discussed at Long Length of Stay Meetings, dates discussed:    Comments:  08/12/12 11:30 am Vance Peper, RN BSN Case [place] [phone] CM spoke with patient concerning cost of Cipro. Explained to him that he can go to [facility] to fill prescription. Will be able to get filled for $28.00 and still have funds to cover his narcotic prescription. Patient stated that he has sufficient funds.

## 2012-08-12 NOTE — Progress Notes (Signed)
Subjective: 4 Days Post-Op Procedure(s) (LRB): Incision, drainage and debridement of C5-6 prevertebral and epidural abscess with fusion and left iliac crest bone graft (N/A) Patient reports pain as mild.   Pain is improving daily.  Discussed meds and possible assistance with cipro.  We will make sure he can secure the meds before discharge today.  He will be going to his family home. Objective: Vital signs in last 24 hours: Temp:  [97.4 F (36.3 C)-98.8 F (37.1 C)] 98.1 F (36.7 C) (03/12 0553) Pulse Rate:  [80-90] 80 (03/12 0553) Resp:  [18-20] 18 (03/12 0553) BP: (103-111)/(64-78) 108/72 mmHg (03/12 0553) SpO2:  [98 %-100 %] 100 % (03/12 0553)  Intake/Output from previous day: 03/11 0701 - 03/12 0700 In: -  Out: 10 [Drains:10] Intake/Output this shift:    No results found for this basename: HGB,  in the last 72 hours No results found for this basename: WBC, RBC, HCT, PLT,  in the last 72 hours No results found for this basename: NA, K, CL, CO2, BUN, CREATININE, GLUCOSE, CALCIUM,  in the last 72 hours No results found for this basename: LABPT, INR,  in the last 72 hours  Neurovascular intact Incision: drain removed/clotted.  no difficulty removing drain.  no further drainage expressed. wound flat and dry.  Assessment/Plan: 4 Days Post-Op Procedure(s) (LRB): Incision, drainage and debridement of C5-6 prevertebral and epidural abscess with fusion and left iliac crest bone graft (N/A) RX for Cipro and Percocet on chart.  Will see if assistance can be obtained for abx.  Advised pt no assist for narcotic.  He requested Oxy IR.  Request denied and pt agreeable to percocet.  OV 1 week. Will check back mid day to discharge if meds can be obtained.  VERNON,SHEILA M 08/12/2012, 8:23 AM

## 2012-08-12 NOTE — Progress Notes (Signed)
Physical Therapy Treatment Patient Details Name: Christopher Berg MRN: [id number] DOB: 06-Aug-1980 Today's Date: 08/12/2012 Time: 6213-0865 PT Time Calculation (min): 12 min  PT Assessment / Plan / Recommendation Comments on Treatment Session   Had patient complete stair training as he is now discharging home. All goals met, no follow up required    Follow Up Recommendations  No PT follow up     Does the patient have the potential to tolerate intense rehabilitation     Barriers to Discharge        Equipment Recommendations  None recommended by PT    Recommendations for Other Services    Frequency Min 5X/week   Plan All goals met and education completed, patient dischaged from PT services    Precautions / Restrictions Precautions Precautions: Cervical Precaution Comments: Educated pt on cervical precautions. Patient given cervical precautions/positioning handout Required Braces or Orthoses: Cervical Brace Cervical Brace: Hard collar Restrictions Weight Bearing Restrictions: No   Pertinent Vitals/Pain Denied pain    Mobility  Bed Mobility Supine to Sit: 7: Independent Sitting - Scoot to Edge of Bed: 7: Independent Sit to Supine: 7: Independent Transfers Sit to Stand: 7: Independent Stand to Sit: 7: Independent Ambulation/Gait Ambulation/Gait Assistance: 7: Independent Ambulation Distance (Feet): 800 Feet Gait Pattern: Within Functional Limits Stairs: Yes Stairs Assistance: 7: Independent Stair Management Technique: No rails Number of Stairs: 5    Exercises     PT Diagnosis:    PT Problem List:   PT Treatment Interventions:     PT Goals Acute Rehab PT Goals PT Goal: Supine/Side to Sit - Progress: Met PT Goal: Sit to Supine/Side - Progress: Met PT Goal: Sit to Stand - Progress: Met PT Goal: Stand to Sit - Progress: Met PT Goal: Ambulate - Progress: Met  Visit Information  Last PT Received On: 08/12/12 Assistance Needed: +1    Subjective Data       Cognition  Cognition Overall Cognitive Status: Appears within functional limits for tasks assessed/performed Arousal/Alertness: Awake/alert Orientation Level: Appears intact for tasks assessed Behavior During Session: [facility] for tasks performed    Balance     End of Session PT - End of Session Equipment Utilized During Treatment: Cervical collar Activity Tolerance: Patient tolerated treatment well Patient left: in bed;with call bell/phone within reach   GP     Robinette, Adline Potter 08/12/2012, 1:39 PM

## 2012-08-12 NOTE — Progress Notes (Signed)
Agreed with stairs. Pt does not need acute PT at this time. Agree with PTA.  [place], [place], [place] [phone]

## 2012-08-12 NOTE — Progress Notes (Signed)
Patient discharged in stable condition. Discharge instructions and prescriptions were given and explained. 

## 2012-08-13 LAB — ANAEROBIC CULTURE: Gram Stain: NONE SEEN

## 2012-08-16 NOTE — Progress Notes (Signed)
Patient note and lab reviewed with Dondra Spry.

## 2012-08-16 NOTE — Progress Notes (Signed)
Patient note and lab reviewed with Vernon PA-C. 

## 2012-08-18 NOTE — Discharge Summary (Signed)
Physician Discharge Summary  Patient ID: Christopher Berg MRN: [id number] DOB/AGE: [birth year] 31 y.o.  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 Hepatitis C infection   . Depression   . Anxiety   . Asthma     "as a child"  . Kidney stone     hx of  . Urinary tract infection     hx of  . Headache     "occas"  . Chronic neck pain   multi substance abuse  Surgeries: Procedure(s): Incision, drainage and debridement of C5-6 prevertebral and epidural abscess with fusion and left iliac crest bone graft on 08/08/2012   Consultants (if any):  Dr Luciana Axe of Infectious Disease Department.  Discharged Condition: Improved  Brief History: 32 year old male presented to [facility] 1/11 referred by [facility] for follow up of neck  pain. Had been seen in [facility] 3 times complaining of neck pain and left upper extremity  weakness. Exam was non focal with diffuse left upper extremity weakness and severe guarding of cervical  movement. MRi showed C5-6 discitis with epidural abscess. He was admitted 06/23/2012 when the results of  the MRI were available. ID consult obtained and he was placed on IV antibiotics. Left arm weakness improved over the next several days and has remained stable and normal. PICCU line was placed. Due to history of multisubstance abuse he was discharged to [facility] in [place] to continue IV antibiotic treatment. Underwent follow up MRI 07/28/2012 which shows a increased size of prevertebral abscess now C3 to C6 with increasing kyphosis at C5-6. Persistant epidural abscess at C5-6. Due to worsening size of the prevertebral abscess and concern of worsening kyphosis at the C5-6 level I have recommended open incision and  drainage and debridement of the prevertebral abscess and biopsy for definitive organism.  Hospital Course: Christopher Berg is an 32 y.o. male who was admitted 08/08/2012 with a diagnosis of Abscess in epidural space of cervical spine and went to the operating room on 08/08/2012 and underwent the above named procedures.    He was given perioperative antibiotics:  Anti-infectives   Start     Dose/Rate Route Frequency Ordered Stop   08/12/12 0000  ciprofloxacin (CIPRO) 500 MG tablet     750 mg Oral 2 times daily 08/12/12 0830     08/11/12 1200  ciprofloxacin (CIPRO) tablet 750 mg  Status:  Discontinued     750 mg Oral 2 times daily 08/11/12 1001 08/12/12 1705   08/10/12 1800  imipenem-cilastatin (PRIMAXIN) 500 mg in sodium chloride 0.9 % 100 mL IVPB  Status:  Discontinued     500 mg 200 mL/hr over 30 Minutes Intravenous 4 times per day 08/10/12 1637 08/11/12 1001   08/08/12 1800  vancomycin (VANCOCIN) IVPB 1000 mg/200 mL premix  Status:  Discontinued     1,000 mg 200 mL/hr over 60 Minutes Intravenous Every 8 hours 08/08/12 1638 08/10/12 1442   08/08/12 1800  cefTRIAXone (ROCEPHIN) 1 g in dextrose 5 % 50 mL IVPB  Status:  Discontinued     1 g 100 mL/hr over 30 Minutes Intravenous Every 12 hours 08/08/12 1638 08/10/12 1616    Antibiotics held at admission to obtain specimen intraop for culture and sensitivity. Final culture and  sensitivity with pansensitive pseudomonas.  Cipro was initiated and pt was able to be discharged home with continued use of oral Cipro for 8 weeks per ID recommendation.  His hemovac drain was discontinued prior to discharge with the wound healing well and no drainage or edema.  NV and motor function of upper extremities intact.  Pt was fitted with Aspen and Philly collar to use at home. Assistance for medication was provided by the care manager for 8 weeks of Cipro.  He was given sequential compression devices, early ambulation for DVT prophylaxis.  He benefited maximally  from the hospital stay and there were no complications.    Recent vital signs:  Filed Vitals:   08/12/12 1300  BP: 143/90  Pulse: 125  Temp: 98.2 F (36.8 C)  Resp: 18    Recent laboratory studies:  Lab Results  Component Value Date   HGB 12.1* 08/09/2012   HGB 15.4 08/08/2012   HGB 15.6 06/25/2012   Lab Results  Component Value Date   WBC 4.5 08/09/2012   PLT 142* 08/09/2012   Lab Results  Component Value Date   INR 1.00 08/08/2012   Lab Results  Component Value Date   NA 139 08/09/2012   K 3.7 08/09/2012   CL 103 08/09/2012   CO2 30 08/09/2012   BUN 16 08/09/2012   CREATININE 0.80 08/09/2012   GLUCOSE 88 08/09/2012    Discharge Medications:     Medication List    STOP taking these medications       OxyCODONE 15 mg T12a  Commonly known as:  OXYCONTIN     oxyCODONE 5 MG immediate release tablet  Commonly known as:  Oxy IR/ROXICODONE      TAKE these medications       ALPRAZolam 0.5 MG tablet  Commonly known as:  XANAX  Take 0.5 mg by mouth 3 (three) times daily.     ciprofloxacin 500 MG tablet  Commonly known as:  CIPRO  Take 1.5 tablets (750 mg total) by mouth 2 (two) times daily.     cyclobenzaprine 10 MG tablet  Commonly known as:  FLEXERIL  Take 1 tablet (10 mg total) by mouth 3 (three) times daily.     oxyCODONE-acetaminophen 5-325 MG per tablet  Commonly known as:  PERCOCET/ROXICET  Take 1-2 tablets by mouth every 4 (four) hours as needed.     sertraline 100 MG tablet  Commonly known as:  ZOLOFT  Take 100 mg by mouth daily.     STOOL SOFTENER PO  Take 1 tablet by mouth at bedtime.        Diagnostic Studies: Dg Cervical Spine 2 Or 3 Views  08/10/2012  *RADIOLOGY REPORT*  Clinical Data: Diskitis and osteomyelitis.  CERVICAL SPINE - 2-3 VIEW  Comparison: Intraoperative radiographs dated 08/08/2012  Findings: The patient has undergone partial vertebrectomies at C5-6 with iliac crest bone graft insertion.  There is prevertebral soft tissue swelling.  Soft  tissue drain in place to the left of midline.  Alignment of the cervical vertebra is normal.  Bone graft appears in good position.  IMPRESSION: Postsurgical changes as described.  Soft tissue drain in place with prevertebral soft tissue swelling.   Original Report Authenticated By: Francene Boyers, M.D.    Dg Cervical Spine 2 Or 3 Views  08/08/2012  *RADIOLOGY REPORT*  Clinical Data: C5-C6 graft.  DG C-ARM 1-60 MIN,CERVICAL SPINE - 2-3 VIEW  Comparison:  MRI of 07/28/2012  Findings: 3 intraoperative views.  The first demonstrates  a surgical device projecting over the C5-C6 interspace.  The second image is a lateral which images through the top of C7.  The third image is an attempted frontal.  Extensive artifact degradation.  Nondiagnostic.  IMPRESSION: Intraoperative imaging.   Original Report Authenticated By: Jeronimo Greaves, M.D.    Mr Cervical Spine Wo Contrast  07/28/2012  *RADIOLOGY REPORT*  Clinical Data: Continued neck pain and stiffness.  MRI CERVICAL SPINE WITHOUT CONTRAST  Technique:  Multiplanar and multiecho pulse sequences of the cervical spine, to include the craniocervical junction and cervicothoracic junction, were obtained according to standard protocol without intravenous contrast.  Comparison: 06/22/2012.  Findings: Persistent and slight worsening of diskitis and osteomyelitis at C5-6.  The anterior prevertebral abscess is slightly larger and extends up to C3.  The epidural abscess appears relatively stable with maximal diameter of 4 mm.  There is mild mass effect on the ventral thecal sac and obliteration of the ventral CSF space.  Further destruction of the C5-6 disc space is noted with diffuse osteomyelitis in the vertebral bodies.  The remaining intervertebral disc spaces are unremarkable and stable.  IMPRESSION: Persistent and slight worsening of diskitis and osteomyelitis at C5- 6.  Enlarging prevertebral abscess and stable epidural abscess.   Original Report Authenticated By: Rudie Meyer,  M.D.    Dg C-arm 1-60 Min  08/08/2012  *RADIOLOGY REPORT*  Clinical Data: C5-C6 graft.  DG C-ARM 1-60 MIN,CERVICAL SPINE - 2-3 VIEW  Comparison:  MRI of 07/28/2012  Findings: 3 intraoperative views.  The first demonstrates a surgical device projecting over the C5-C6 interspace.  The second image is a lateral which images through the top of C7.  The third image is an attempted frontal.  Extensive artifact degradation.  Nondiagnostic.  IMPRESSION: Intraoperative imaging.   Original Report Authenticated By: Jeronimo Greaves, M.D.     Disposition: 01-Home or Self Care      Discharge Orders   Future Appointments Provider Department Dept Phone   08/26/2012 9:00 AM Ginnie Smart, MD [facility] for Infectious Disease [phone]   Future Orders Complete By Expires     Call MD / Call 911  As directed     Comments:      If you experience chest pain or shortness of breath, CALL 911 and be transported to the hospital emergency room.  If you develope a fever above 101 F, pus (white drainage) or increased drainage or redness at the wound, or calf pain, call your surgeon's office.    Constipation Prevention  As directed     Comments:      Drink plenty of fluids.  Prune juice may be helpful.  You may use a stool softener, such as Colace (over the counter) 100 mg twice a day.  Use MiraLax (over the counter) for constipation as needed.    Diet - low sodium heart healthy  As directed     Comments:      May advance diet as tolerated.  If swallowing difficulty cut back to clear diet.    Discharge instructions  As directed     Comments:      Wear collar at all times.  Keep wound dry and clean until no drainage then may shower with wound covered using Philly (hard plastic collar) for showers.  No lifting.    Driving restrictions  As directed     Comments:      No driving    Increase activity slowly as tolerated  As directed  Lifting restrictions  As directed     Comments:      No lifting        Follow-up Information   Follow up with [name],[name], MD In 1 week. (see in office next week. call for time)    Contact information:   [address] [place] [address] [phone]        Signed: Wende Neighbors 08/18/2012, 3:44 PM

## 2012-08-19 NOTE — Discharge Summary (Signed)
Patient's Discharge Summary and lab reviewed with [facility].

## 2012-08-26 ENCOUNTER — Telehealth: Payer: Self-pay | Admitting: *Deleted

## 2012-08-26 ENCOUNTER — Ambulatory Visit: Payer: PRIVATE HEALTH INSURANCE | Admitting: Infectious Diseases

## 2012-08-26 NOTE — Telephone Encounter (Signed)
Called patient and left message for him to call clinic to reschedule. He no showed today and  still has active infection. Christopher Berg

## 2012-09-03 LAB — FUNGUS CULTURE W SMEAR

## 2012-09-17 ENCOUNTER — Telehealth: Payer: Self-pay | Admitting: Infectious Disease

## 2012-09-17 NOTE — Telephone Encounter (Signed)
Attempted to call patient, no answer and no voice mail Christopher Berg

## 2012-09-17 NOTE — Telephone Encounter (Signed)
Received phone call from Dr. Otelia Sergeant  Pt has been doing well but ran out of his abx  Dr Otelia Sergeant is calling in additional cipro to try to push to at least 8 weeks postop therapy  No hardware placed during surgery  Pt claims he has been unable to make an appt in [facility] (hwat he told Dr. Otelia Sergeant) Looks like he no-showed for scheduled appt  I assured Dr. Otelia Sergeant we would get in touch with pt and get him back into [facility] clinic  His phone number that works is [phone]

## 2012-09-20 LAB — AFB CULTURE WITH SMEAR (NOT AT ARMC): Acid Fast Smear: NONE SEEN

## 2012-12-23 ENCOUNTER — Encounter (HOSPITAL_COMMUNITY): Payer: Self-pay | Admitting: *Deleted

## 2012-12-23 ENCOUNTER — Emergency Department (HOSPITAL_COMMUNITY)
Admission: EM | Admit: 2012-12-23 | Discharge: 2012-12-24 | Disposition: A | Discharge status: Home / Self Care | Payer: Self-pay | Attending: Emergency Medicine | Admitting: Emergency Medicine

## 2012-12-23 DIAGNOSIS — F3289 Other specified depressive episodes: Secondary | ICD-10-CM | POA: Insufficient documentation

## 2012-12-23 DIAGNOSIS — Z8619 Personal history of other infectious and parasitic diseases: Secondary | ICD-10-CM | POA: Insufficient documentation

## 2012-12-23 DIAGNOSIS — Z87440 Personal history of urinary (tract) infections: Secondary | ICD-10-CM | POA: Insufficient documentation

## 2012-12-23 DIAGNOSIS — F329 Major depressive disorder, single episode, unspecified: Secondary | ICD-10-CM | POA: Insufficient documentation

## 2012-12-23 DIAGNOSIS — Z9889 Other specified postprocedural states: Secondary | ICD-10-CM | POA: Insufficient documentation

## 2012-12-23 DIAGNOSIS — G8929 Other chronic pain: Secondary | ICD-10-CM | POA: Insufficient documentation

## 2012-12-23 DIAGNOSIS — Z981 Arthrodesis status: Secondary | ICD-10-CM | POA: Insufficient documentation

## 2012-12-23 DIAGNOSIS — M542 Cervicalgia: Secondary | ICD-10-CM | POA: Insufficient documentation

## 2012-12-23 DIAGNOSIS — F411 Generalized anxiety disorder: Secondary | ICD-10-CM | POA: Insufficient documentation

## 2012-12-23 DIAGNOSIS — J45909 Unspecified asthma, uncomplicated: Secondary | ICD-10-CM | POA: Insufficient documentation

## 2012-12-23 DIAGNOSIS — Z87442 Personal history of urinary calculi: Secondary | ICD-10-CM | POA: Insufficient documentation

## 2012-12-23 DIAGNOSIS — F17200 Nicotine dependence, unspecified, uncomplicated: Secondary | ICD-10-CM | POA: Insufficient documentation

## 2012-12-23 DIAGNOSIS — Z79899 Other long term (current) drug therapy: Secondary | ICD-10-CM | POA: Insufficient documentation

## 2012-12-23 NOTE — ED Notes (Addendum)
Pt reporting pain in neck beginning this morning, and increasing through day.

## 2012-12-24 MED ORDER — IBUPROFEN 800 MG PO TABS
800.0000 mg | ORAL_TABLET | Freq: Once | ORAL | Status: AC
  Administered 2012-12-24: 800 mg via ORAL
  Filled 2012-12-24: qty 1

## 2012-12-24 NOTE — ED Notes (Signed)
Pt very angry that he wasn't given medication stronger than Ibuprofen.  States "I've already taken some and it hasn't worked."

## 2012-12-24 NOTE — ED Provider Notes (Signed)
History    CSN: [id number] Arrival date & time 12/23/12  2143  First MD Initiated Contact with Patient 12/24/12 0004     Chief Complaint  Patient presents with  . Neck Pain   (Consider location/radiation/quality/duration/timing/severity/associated sxs/prior Treatment) HPI HPI Comments: Christopher Berg is a 32 y.o. male who presents to the Emergency Department complaining of awakening with neck pain this morning which has gotten worse throughout the day. It now feels like electric shocks though his neck. He has taken ibuprofen without relief. He has a h/o drainage and debridement of a epidural abscess at C5-6 with fusion done in 08/2012 by Dr. Otelia Sergeant. His current pain is not similar to previous pain. He denies fever, chills.  PCP Dr. Leanord Hawking Past Medical History  Diagnosis Date  . Hepatitis C infection   . Depression   . Anxiety   . Asthma     "as a child"  . Kidney stone     hx of  . Urinary tract infection     hx of  . Headache(784.0)     "occas"  . Chronic neck pain    Past Surgical History  Procedure Laterality Date  . No past surgeries    . Anterior cervical decomp/discectomy fusion N/A 08/08/2012    Procedure: Incision, drainage and debridement of C5-6 prevertebral and epidural abscess with fusion and left iliac crest bone graft;  Surgeon: Kerrin Champagne, MD;  Location: [facility] OR;  Service: Orthopedics;  Laterality: N/A;   History reviewed. No pertinent family history. History  Substance Use Topics  . Smoking status: Current Every Day Smoker -- 1.00 packs/day    Types: Cigarettes  . Smokeless tobacco: Never Used  . Alcohol Use: Yes     Comment: socially    Review of Systems  Constitutional: Negative for fever.       10 Systems reviewed and are negative for acute change except as noted in the HPI.  HENT: Negative for congestion.   Eyes: Negative for discharge and redness.  Respiratory: Negative for cough and shortness of breath.   Cardiovascular: Negative for chest  pain.  Gastrointestinal: Negative for vomiting and abdominal pain.  Musculoskeletal: Negative for back pain.       Neck pain  Skin: Negative for rash.  Neurological: Negative for syncope, numbness and headaches.  Psychiatric/Behavioral:       No behavior change.    Allergies  Review of patient's allergies indicates no known allergies.  Home Medications   Current Outpatient Rx  Name  Route  Sig  Dispense  Refill  . ALPRAZolam (XANAX) 0.5 MG tablet   Oral   Take 0.5 mg by mouth 3 (three) times daily.         . ciprofloxacin (CIPRO) 500 MG tablet   Oral   Take 1.5 tablets (750 mg total) by mouth 2 (two) times daily.   90 tablet   0   . cyclobenzaprine (FLEXERIL) 10 MG tablet   Oral   Take 1 tablet (10 mg total) by mouth 3 (three) times daily.   30 tablet      . Docusate Calcium (STOOL SOFTENER PO)   Oral   Take 1 tablet by mouth at bedtime.         Marland Kitchen oxyCODONE-acetaminophen (PERCOCET/ROXICET) 5-325 MG per tablet   Oral   Take 1-2 tablets by mouth every 4 (four) hours as needed.   60 tablet   0   . sertraline (ZOLOFT) 100 MG tablet   Oral  Take 100 mg by mouth daily.          BP 136/88  Pulse 81  Temp(Src) 98.1 F (36.7 C) (Oral)  Resp 20  Ht 6' (1.829 m)  Wt 150 lb 9.6 oz (68.312 kg)  BMI 20.42 kg/m2  SpO2 100% Physical Exam  Nursing note and vitals reviewed. Constitutional: He appears well-developed and well-nourished.  Awake, alert, nontoxic appearance.  HENT:  Head: Normocephalic and atraumatic.  Eyes: EOM are normal. Pupils are equal, round, and reactive to light. Right eye exhibits no discharge. Left eye exhibits no discharge.  Neck: Neck supple.  Tenderness with palpation to posterior neck c/w muscular tightness.  Pulmonary/Chest: Effort normal. He exhibits no tenderness.  Abdominal: Soft. There is no tenderness. There is no rebound.  Musculoskeletal: He exhibits no tenderness.  Baseline ROM, no obvious new focal weakness.  Neurological:   Mental status and motor strength appears baseline for patient and situation.  Skin: No rash noted.  Psychiatric: He has a normal mood and affect.    ED Course  Procedures (including critical care time)  1. Neck pain     MDM  Patient with neck pain since awakening this morning. Given ibuprofen. Referral to Dr. Otelia Sergeant for follow up. Pt stable in ED with no significant deterioration in condition.The patient appears reasonably screened and/or stabilized for discharge and I doubt any other medical condition or other [facility] requiring further screening, evaluation, or treatment in the ED at this time prior to discharge.  MDM Reviewed: nursing note and vitals     Nicoletta Dress. Colon Branch, MD 12/24/12 4098

## 2012-12-24 NOTE — ED Notes (Signed)
Pt upset over discharge instructions, states "I did not get anything except Ibuprofen for this"

## 2013-02-23 ENCOUNTER — Emergency Department (HOSPITAL_COMMUNITY)
Admission: EM | Admit: 2013-02-23 | Discharge: 2013-02-24 | Disposition: A | Discharge status: Rehabilitation Facility | Payer: Self-pay | Attending: Emergency Medicine | Admitting: Emergency Medicine

## 2013-02-23 ENCOUNTER — Encounter (HOSPITAL_COMMUNITY): Payer: Self-pay

## 2013-02-23 DIAGNOSIS — F3289 Other specified depressive episodes: Secondary | ICD-10-CM | POA: Insufficient documentation

## 2013-02-23 DIAGNOSIS — G8929 Other chronic pain: Secondary | ICD-10-CM | POA: Insufficient documentation

## 2013-02-23 DIAGNOSIS — Z87440 Personal history of urinary (tract) infections: Secondary | ICD-10-CM | POA: Insufficient documentation

## 2013-02-23 DIAGNOSIS — R109 Unspecified abdominal pain: Secondary | ICD-10-CM | POA: Insufficient documentation

## 2013-02-23 DIAGNOSIS — J45909 Unspecified asthma, uncomplicated: Secondary | ICD-10-CM | POA: Insufficient documentation

## 2013-02-23 DIAGNOSIS — F1120 Opioid dependence, uncomplicated: Secondary | ICD-10-CM | POA: Insufficient documentation

## 2013-02-23 DIAGNOSIS — Z792 Long term (current) use of antibiotics: Secondary | ICD-10-CM | POA: Insufficient documentation

## 2013-02-23 DIAGNOSIS — Z79899 Other long term (current) drug therapy: Secondary | ICD-10-CM | POA: Insufficient documentation

## 2013-02-23 DIAGNOSIS — F17200 Nicotine dependence, unspecified, uncomplicated: Secondary | ICD-10-CM | POA: Insufficient documentation

## 2013-02-23 DIAGNOSIS — Z87442 Personal history of urinary calculi: Secondary | ICD-10-CM | POA: Insufficient documentation

## 2013-02-23 DIAGNOSIS — R112 Nausea with vomiting, unspecified: Secondary | ICD-10-CM | POA: Insufficient documentation

## 2013-02-23 DIAGNOSIS — F1110 Opioid abuse, uncomplicated: Secondary | ICD-10-CM

## 2013-02-23 DIAGNOSIS — M542 Cervicalgia: Secondary | ICD-10-CM | POA: Insufficient documentation

## 2013-02-23 DIAGNOSIS — Z8619 Personal history of other infectious and parasitic diseases: Secondary | ICD-10-CM | POA: Insufficient documentation

## 2013-02-23 DIAGNOSIS — R197 Diarrhea, unspecified: Secondary | ICD-10-CM | POA: Insufficient documentation

## 2013-02-23 DIAGNOSIS — F329 Major depressive disorder, single episode, unspecified: Secondary | ICD-10-CM | POA: Insufficient documentation

## 2013-02-23 DIAGNOSIS — F411 Generalized anxiety disorder: Secondary | ICD-10-CM | POA: Insufficient documentation

## 2013-02-23 LAB — COMPREHENSIVE METABOLIC PANEL
ALT: 137 U/L — ABNORMAL HIGH (ref 0–53)
AST: 92 U/L — ABNORMAL HIGH (ref 0–37)
Alkaline Phosphatase: 72 U/L (ref 39–117)
CO2: 30 mEq/L (ref 19–32)
Chloride: 97 mEq/L (ref 96–112)
GFR calc non Af Amer: >90 mL/min (ref >90)
Glucose, Bld: 135 mg/dL — ABNORMAL HIGH (ref 70–99)
Potassium: 3.3 mEq/L — ABNORMAL LOW (ref 3.5–5.1)
Sodium: 137 mEq/L (ref 135–145)
Total Bilirubin: 0.9 mg/dL (ref 0.3–1.2)

## 2013-02-23 LAB — CBC
Hemoglobin: 15.7 g/dL (ref 13.0–17.0)
Platelets: 166 10*3/uL (ref 150–400)
RBC: 4.94 MIL/uL (ref 4.22–5.81)
WBC: 9.4 10*3/uL (ref 4.0–10.5)

## 2013-02-23 LAB — RAPID URINE DRUG SCREEN, HOSP PERFORMED
Amphetamines: NOT DETECTED
Barbiturates: NOT DETECTED
Benzodiazepines: NOT DETECTED
Tetrahydrocannabinol: POSITIVE — AB

## 2013-02-23 LAB — SALICYLATE LEVEL: Salicylate Lvl: <2 mg/dL — ABNORMAL LOW (ref 2.8–20.0)

## 2013-02-23 NOTE — ED Notes (Signed)
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 Pt reports he was recently hospitalized for an abscess due to using dirty needles. Pt reports he was discharged home with pain medication. Pt reports he last used intravenous heroin approx 3 hours ago, pt reports he uses approx 1/2 gram of Heroin per day depending on how much money he has that day. Pt denies suicidal or homicidal ideation. Pt is afraid of going through detox with out assistnace

## 2013-02-24 MED ORDER — HYDROXYZINE HCL 25 MG PO TABS
25.0000 mg | ORAL_TABLET | Freq: Four times a day (QID) | ORAL | Status: DC | PRN
  Administered 2013-02-24: 25 mg via ORAL
  Filled 2013-02-24: qty 1

## 2013-02-24 MED ORDER — ONDANSETRON 4 MG PO TBDP: 4.0000 mg | ORAL_TABLET | Freq: Four times a day (QID) | ORAL | Status: DC | PRN

## 2013-02-24 MED ORDER — LOPERAMIDE HCL 2 MG PO CAPS: 2.0000 mg | ORAL_CAPSULE | ORAL | Status: DC | PRN

## 2013-02-24 MED ORDER — CLONIDINE HCL 0.1 MG PO TABS: 0.1000 mg | ORAL_TABLET | ORAL | Status: DC

## 2013-02-24 MED ORDER — LORAZEPAM 1 MG PO TABS: 1.0000 mg | ORAL_TABLET | Freq: Three times a day (TID) | ORAL | Status: DC | PRN

## 2013-02-24 MED ORDER — CLONIDINE HCL 0.1 MG PO TABS
0.1000 mg | ORAL_TABLET | Freq: Four times a day (QID) | ORAL | Status: DC
  Administered 2013-02-24: 0.1 mg via ORAL
  Filled 2013-02-24: qty 1

## 2013-02-24 MED ORDER — ZOLPIDEM TARTRATE 5 MG PO TABS: 5.0000 mg | ORAL_TABLET | Freq: Every evening | ORAL | Status: DC | PRN

## 2013-02-24 MED ORDER — IBUPROFEN 400 MG PO TABS
600.0000 mg | ORAL_TABLET | Freq: Three times a day (TID) | ORAL | Status: DC | PRN
  Administered 2013-02-24: 600 mg via ORAL
  Filled 2013-02-24 (×2): qty 1

## 2013-02-24 MED ORDER — NAPROXEN 250 MG PO TABS: 500.0000 mg | ORAL_TABLET | Freq: Two times a day (BID) | ORAL | Status: DC | PRN

## 2013-02-24 MED ORDER — DICYCLOMINE HCL 20 MG PO TABS
20.0000 mg | ORAL_TABLET | Freq: Four times a day (QID) | ORAL | Status: DC | PRN
  Administered 2013-02-24: 20 mg via ORAL
  Filled 2013-02-24: qty 1

## 2013-02-24 MED ORDER — METHOCARBAMOL 500 MG PO TABS
500.0000 mg | ORAL_TABLET | Freq: Three times a day (TID) | ORAL | Status: DC | PRN
  Administered 2013-02-24: 500 mg via ORAL
  Filled 2013-02-24: qty 1

## 2013-02-24 MED ORDER — ALUM & MAG HYDROXIDE-SIMETH 200-200-20 MG/5ML PO SUSP: 30.0000 mL | ORAL | Status: DC | PRN

## 2013-02-24 MED ORDER — ONDANSETRON HCL 4 MG PO TABS
4.0000 mg | ORAL_TABLET | Freq: Three times a day (TID) | ORAL | Status: DC | PRN
  Administered 2013-02-24: 4 mg via ORAL
  Filled 2013-02-24: qty 1

## 2013-02-24 MED ORDER — NICOTINE 21 MG/24HR TD PT24: 21.0000 mg | MEDICATED_PATCH | Freq: Every day | TRANSDERMAL | Status: DC

## 2013-02-24 MED ORDER — CLONIDINE HCL 0.1 MG PO TABS: 0.1000 mg | ORAL_TABLET | Freq: Every day | ORAL | Status: DC

## 2013-02-24 NOTE — ED Notes (Signed)
Pt. Requesting something to eat since he hasn't eating recently and he is very hungry. States last heroin use was 3hrs. Prior to arrival to hospital. Pt. Was placed in room 26 to change into blue scrubs.

## 2013-02-24 NOTE — ED Notes (Signed)
Pt. wanded by security. Belongings searched and placed in secured area. Room 26 were Pt. Was changing noted to have white substance on large portion of floor. [employer] and charge nurse alerted. [employer] to bring test kit.

## 2013-02-24 NOTE — ED Provider Notes (Signed)
10:02 AM  Pt here for detox from heroin and opiates. Patient has been medically cleared prior prior providers and is being discharged to [facility] for detox. He is not involuntarily committed. I was not directly involved in this patient's care.  Layla Maw Quintavia Rogstad, DO 02/24/13 1002

## 2013-02-24 NOTE — ED Provider Notes (Signed)
CSN: [id number]     Arrival date & time 02/23/13  2041 History   First MD Initiated Contact with Patient 02/24/13 0013     Chief Complaint  Patient presents with  . Medical Clearance   HPI  History provided by the patient. Patient is a 32 year old male presenting with request for help with heroine and opiate detox. Patient has long history of heroin abuse. He has been to rehabilitation 3 different occasions but this has never been successful he continues to use. Patient states that his use it is becoming out of control and has come to a "head" and he now is requesting help. He reports daily heavy use of heroin and occasional pain medications. He states that his addiction is to the point that any time medications begin to wear off he has symptoms of diarrhea abdominal cramping and nausea vomiting leading to him to reuse. He has been well recently without any other complaints. Denies any fevers, chills or sweats. Denies any depression, SI or HI. No other aggravating or alleviating factors. No other associated symptoms.    Past Medical History  Diagnosis Date  . Hepatitis C infection   . Depression   . Anxiety   . Asthma     "as a child"  . Kidney stone     hx of  . Urinary tract infection     hx of  . Headache(784.0)     "occas"  . Chronic neck pain    Past Surgical History  Procedure Laterality Date  . No past surgeries    . Anterior cervical decomp/discectomy fusion N/A 08/08/2012    Procedure: Incision, drainage and debridement of C5-6 prevertebral and epidural abscess with fusion and left iliac crest bone graft;  Surgeon: Kerrin Champagne, MD;  Location: [facility] OR;  Service: Orthopedics;  Laterality: N/A;   History reviewed. No pertinent family history. History  Substance Use Topics  . Smoking status: Current Every Day Smoker -- 1.00 packs/day    Types: Cigarettes  . Smokeless tobacco: Never Used  . Alcohol Use: Yes     Comment: socially    Review of Systems  Constitutional:  Negative for fever, chills and diaphoresis.  Respiratory: Negative for cough.   Gastrointestinal: Negative for nausea, vomiting, abdominal pain and diarrhea.  All other systems reviewed and are negative.    Allergies  Review of patient's allergies indicates no known allergies.  Home Medications   Current Outpatient Rx  Name  Route  Sig  Dispense  Refill  . ALPRAZolam (XANAX) 0.5 MG tablet   Oral   Take 0.5 mg by mouth 3 (three) times daily.         . buprenorphine-naloxone (SUBOXONE) 8-2 MG SUBL SL tablet   Sublingual   Place 1 tablet under the tongue daily.         . diphenhydrAMINE (BENADRYL) 25 mg capsule   Oral   Take 25 mg by mouth every 6 (six) hours as needed for itching.         . ciprofloxacin (CIPRO) 500 MG tablet   Oral   Take 1.5 tablets (750 mg total) by mouth 2 (two) times daily.   90 tablet   0    BP 124/83  Pulse 86  Temp(Src) 98.1 F (36.7 C) (Oral)  Resp 16  SpO2 97% Physical Exam  Nursing note and vitals reviewed. Constitutional: He is oriented to person, place, and time. He appears well-developed and well-nourished. No distress.  HENT:  Head: Normocephalic.  Cardiovascular: Normal rate and regular rhythm.   Pulmonary/Chest: Effort normal and breath sounds normal. No respiratory distress. He has no wheezes. He has no rales.  Abdominal: Soft.  Musculoskeletal: Normal range of motion.  Neurological: He is alert and oriented to person, place, and time.  Skin: Skin is warm.  Psychiatric: He has a normal mood and affect. His behavior is normal.    ED Course  Procedures   Results for orders placed during the hospital encounter of 02/23/13  CBC      Result Value Range   WBC 9.4  4.0 - 10.5 K/uL   RBC 4.94  4.22 - 5.81 MIL/uL   Hemoglobin 15.7  13.0 - 17.0 g/dL   HCT 16.1  09.6 - 04.5 %   MCV 87.2  78.0 - 100.0 fL   MCH 31.8  26.0 - 34.0 pg   MCHC 36.4 (*) 30.0 - 36.0 g/dL   RDW 40.9  81.1 - 91.4 %   Platelets 166  150 - 400 K/uL   COMPREHENSIVE METABOLIC PANEL      Result Value Range   Sodium 137  135 - 145 mEq/L   Potassium 3.3 (*) 3.5 - 5.1 mEq/L   Chloride 97  96 - 112 mEq/L   CO2 30  19 - 32 mEq/L   Glucose, Bld 135 (*) 70 - 99 mg/dL   BUN 9  6 - 23 mg/dL   Creatinine, Ser 7.82  0.50 - 1.35 mg/dL   Calcium 9.2  8.4 - 95.6 mg/dL   Total Protein 7.8  6.0 - 8.3 g/dL   Albumin 4.1  3.5 - 5.2 g/dL   AST 92 (*) 0 - 37 U/L   ALT 137 (*) 0 - 53 U/L   Alkaline Phosphatase 72  39 - 117 U/L   Total Bilirubin 0.9  0.3 - 1.2 mg/dL   GFR calc non Af Amer >90  >90 mL/min   GFR calc Af Amer >90  >90 mL/min  ETHANOL      Result Value Range   Alcohol, Ethyl (B) <11  0 - 11 mg/dL  ACETAMINOPHEN LEVEL      Result Value Range   Acetaminophen (Tylenol), Serum <15.0  10 - 30 ug/mL  SALICYLATE LEVEL      Result Value Range   Salicylate Lvl <2.0 (*) 2.8 - 20.0 mg/dL  URINE RAPID DRUG SCREEN (HOSP PERFORMED)      Result Value Range   Opiates POSITIVE (*) NONE DETECTED   Cocaine NONE DETECTED  NONE DETECTED   Benzodiazepines NONE DETECTED  NONE DETECTED   Amphetamines NONE DETECTED  NONE DETECTED   Tetrahydrocannabinol POSITIVE (*) NONE DETECTED   Barbiturates NONE DETECTED  NONE DETECTED         MDM   1. Heroin abuse      12:10AM patient seen and evaluated. Patient well-appearing no acute distress. Denies any depression, SI or HI. He is here voluntary.   Patient was evaluated by [facility]. Recommend inpatient rehabilitation [facility]. Patient will await bed placement. Psych holding orders in place. Clonidine withdrawal protocol also ordered   Angus Seller, PA-C 02/24/13 [phone]
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
## 2013-02-24 NOTE — ED Provider Notes (Signed)
Medical screening examination/treatment/procedure(s) were performed by non-physician practitioner and as supervising physician I was immediately available for consultation/collaboration.    Gilda Crease, MD 02/24/13 [phone]

## 2013-02-24 NOTE — Progress Notes (Signed)
B.Jerilee Space, MHT was requested by Berna Spare, [facility] to seek detox placement for patient who presented to [facility] wanting help with Heroin dependence IV use daily. Writer contacted [facility] who reports no [facility] bed availability, [facility] spoke with Janus Molder who reports availability and to submit referral. Writer faxed referral for review and Janus Molder of [facility] reports acceptance pending authorization from [employer]. Writer contacted [employer] spoke with Arlethia and assisted with enrollment process and authorization by providing ASAM and [employer] criteria. Patient has been approved for detox treatment beginning on today 02/24/13 for three days (TAR number provided [id number]). Writer attempted to facilitate transportation but was declined cab voucher from [employer]. Writer spoke with patient this am and he states that he can have his girl friend transport him on today between 10am and 11am to facility. Attending nurse should contact [facility] when patient has been discharged and is in route to facility.

## 2013-02-24 NOTE — ED Notes (Signed)
Patient friend Christopher Berg here to transport. Christopher Berg is aware pt must go directly to [facility]. That the pt cannot use before going. And that he must have him there within an hour. Christopher Berg is agreeable. [facility] called upon pt departure

## 2013-02-28 ENCOUNTER — Encounter (HOSPITAL_COMMUNITY): Payer: Self-pay | Admitting: *Deleted

## 2013-02-28 ENCOUNTER — Emergency Department (HOSPITAL_COMMUNITY)
Admission: EM | Admit: 2013-02-28 | Discharge: 2013-02-28 | Disposition: A | Discharge status: Home / Self Care | Payer: PRIVATE HEALTH INSURANCE | Attending: Emergency Medicine | Admitting: Emergency Medicine

## 2013-02-28 DIAGNOSIS — F329 Major depressive disorder, single episode, unspecified: Secondary | ICD-10-CM | POA: Insufficient documentation

## 2013-02-28 DIAGNOSIS — Z87442 Personal history of urinary calculi: Secondary | ICD-10-CM | POA: Insufficient documentation

## 2013-02-28 DIAGNOSIS — F1110 Opioid abuse, uncomplicated: Secondary | ICD-10-CM | POA: Insufficient documentation

## 2013-02-28 DIAGNOSIS — F17200 Nicotine dependence, unspecified, uncomplicated: Secondary | ICD-10-CM | POA: Insufficient documentation

## 2013-02-28 DIAGNOSIS — J45909 Unspecified asthma, uncomplicated: Secondary | ICD-10-CM | POA: Insufficient documentation

## 2013-02-28 DIAGNOSIS — Z79899 Other long term (current) drug therapy: Secondary | ICD-10-CM | POA: Insufficient documentation

## 2013-02-28 DIAGNOSIS — R110 Nausea: Secondary | ICD-10-CM | POA: Insufficient documentation

## 2013-02-28 DIAGNOSIS — Z87440 Personal history of urinary (tract) infections: Secondary | ICD-10-CM | POA: Insufficient documentation

## 2013-02-28 DIAGNOSIS — F3289 Other specified depressive episodes: Secondary | ICD-10-CM | POA: Insufficient documentation

## 2013-02-28 DIAGNOSIS — Z8619 Personal history of other infectious and parasitic diseases: Secondary | ICD-10-CM | POA: Insufficient documentation

## 2013-02-28 DIAGNOSIS — F411 Generalized anxiety disorder: Secondary | ICD-10-CM | POA: Insufficient documentation

## 2013-02-28 DIAGNOSIS — G8929 Other chronic pain: Secondary | ICD-10-CM | POA: Insufficient documentation

## 2013-02-28 LAB — COMPREHENSIVE METABOLIC PANEL
ALT: 65 U/L — ABNORMAL HIGH (ref 0–53)
AST: 28 U/L (ref 0–37)
Albumin: 3.8 g/dL (ref 3.5–5.2)
Alkaline Phosphatase: 66 U/L (ref 39–117)
BUN: 11 mg/dL (ref 6–23)
Calcium: 9.8 mg/dL (ref 8.4–10.5)
Chloride: 101 mEq/L (ref 96–112)
Potassium: 3.4 mEq/L — ABNORMAL LOW (ref 3.5–5.1)
Sodium: 140 mEq/L (ref 135–145)
Total Bilirubin: 0.4 mg/dL (ref 0.3–1.2)

## 2013-02-28 LAB — CBC WITH DIFFERENTIAL/PLATELET
Basophils Absolute: 0 10*3/uL (ref 0.0–0.1)
Basophils Relative: 0 % (ref 0–1)
Eosinophils Relative: 2 % (ref 0–5)
Hemoglobin: 15.9 g/dL (ref 13.0–17.0)
Lymphs Abs: 3.9 10*3/uL (ref 0.7–4.0)
MCHC: 35.3 g/dL (ref 30.0–36.0)
Monocytes Relative: 6 % (ref 3–12)
Neutro Abs: 4.6 10*3/uL (ref 1.7–7.7)
Neutrophils Relative %: 50 % (ref 43–77)
Platelets: 221 10*3/uL (ref 150–400)
RBC: 5.03 MIL/uL (ref 4.22–5.81)

## 2013-02-28 LAB — RAPID URINE DRUG SCREEN, HOSP PERFORMED
Amphetamines: NOT DETECTED
Barbiturates: NOT DETECTED
Benzodiazepines: NOT DETECTED
Cocaine: NOT DETECTED
Opiates: NOT DETECTED
Tetrahydrocannabinol: POSITIVE — AB

## 2013-02-28 LAB — ACETAMINOPHEN LEVEL: Acetaminophen (Tylenol), Serum: <15 ug/mL (ref 10–30)

## 2013-02-28 LAB — SALICYLATE LEVEL: Salicylate Lvl: 0 mg/dL — ABNORMAL LOW (ref 2.8–20.0)

## 2013-02-28 MED ORDER — LOPERAMIDE HCL 2 MG PO CAPS: 2.0000 mg | ORAL_CAPSULE | ORAL | Status: DC | PRN

## 2013-02-28 MED ORDER — CLONIDINE HCL 0.1 MG PO TABS
0.1000 mg | ORAL_TABLET | Freq: Four times a day (QID) | ORAL | Status: DC
  Administered 2013-02-28: 0.1 mg via ORAL
  Filled 2013-02-28: qty 1

## 2013-02-28 MED ORDER — HYDROXYZINE HCL 25 MG PO TABS: 25.0000 mg | ORAL_TABLET | Freq: Four times a day (QID) | ORAL | Status: DC | PRN

## 2013-02-28 MED ORDER — HYDROXYZINE HCL 25 MG PO TABS: 25.0000 mg | ORAL_TABLET | Freq: Four times a day (QID) | ORAL | Status: DC

## 2013-02-28 MED ORDER — ONDANSETRON 4 MG PO TBDP: 4.0000 mg | ORAL_TABLET | Freq: Four times a day (QID) | ORAL | Status: DC | PRN

## 2013-02-28 MED ORDER — CLONIDINE HCL 0.1 MG PO TABS: 0.1000 mg | ORAL_TABLET | ORAL | Status: DC

## 2013-02-28 MED ORDER — DICYCLOMINE HCL 20 MG PO TABS: 20.0000 mg | ORAL_TABLET | Freq: Two times a day (BID) | ORAL | Status: DC

## 2013-02-28 MED ORDER — METHOCARBAMOL 500 MG PO TABS: 500.0000 mg | ORAL_TABLET | Freq: Three times a day (TID) | ORAL | Status: DC | PRN

## 2013-02-28 MED ORDER — NAPROXEN 250 MG PO TABS: 500.0000 mg | ORAL_TABLET | Freq: Two times a day (BID) | ORAL | Status: DC | PRN

## 2013-02-28 MED ORDER — ONDANSETRON HCL 4 MG PO TABS: 4.0000 mg | ORAL_TABLET | Freq: Four times a day (QID) | ORAL | Status: DC

## 2013-02-28 MED ORDER — DICYCLOMINE HCL 20 MG PO TABS
20.0000 mg | ORAL_TABLET | Freq: Four times a day (QID) | ORAL | Status: DC | PRN
  Filled 2013-02-28: qty 1

## 2013-02-28 MED ORDER — CLONIDINE HCL 0.1 MG PO TABS: 0.1000 mg | ORAL_TABLET | Freq: Every day | ORAL | Status: DC

## 2013-02-28 NOTE — ED Notes (Signed)
Pt here stating he wants help because he has a 15 yr opioid addiction. Pt crushed a 30mg  roxi and shot it up today and has smoked two joints. Pt states he went to [facility] and was given Ativan for 3 days and was discharged and was using heroin again on the 4th day. Pt states he is depressed and mentions suicide when he is going through withdrawls. Pt right now denies si/hi but when he starts withdrawing he gets suicidal.

## 2013-02-28 NOTE — ED Provider Notes (Signed)
Scribed for No att. providers found, the patient was seen in room APA17/APA17. This chart was scribed by Lewanda Rife, ED scribe. Patient's care was started at 2138  CSN: [id number]     Arrival date & time 02/28/13  2115 History   First MD Initiated Contact with Patient 02/28/13 2129     Chief Complaint  Patient presents with  . V70.1   (Consider location/radiation/quality/duration/timing/severity/associated sxs/prior Treatment) The history is provided by the patient.    HPI Comments: Christopher Berg is a 32 y.o. male who presents to the Emergency Department complaining of opiate abuse onset 15 years. Reports he last used Roxicodone and Suboxone IV today at 3 pm. Reports he is using clean needles. Reports he was inpatient for 3 days and d/c from [facility] yesterday. Reports associated waxing and waning mild nausea, and anxiety onset today after using. Denies associated headaches, neck pain, back pain, abdominal pain, emesis, fever, and chest pain. Denies any auditory and visual hallucinations. Denies drinking alcohol abuse. Denies SI and HI at this time time. Reports PMHx of Hepatitis C and anterior cervical abscess.    Past Medical History  Diagnosis Date  . Hepatitis C infection   . Depression   . Anxiety   . Asthma     "as a child"  . Kidney stone     hx of  . Urinary tract infection     hx of  . Headache(784.0)     "occas"  . Chronic neck pain    Past Surgical History  Procedure Laterality Date  . No past surgeries    . Anterior cervical decomp/discectomy fusion N/A 08/08/2012    Procedure: Incision, drainage and debridement of C5-6 prevertebral and epidural abscess with fusion and left iliac crest bone graft;  Surgeon: Kerrin Champagne, MD;  Location: [facility] OR;  Service: Orthopedics;  Laterality: N/A;   History reviewed. No pertinent family history. History  Substance Use Topics  . Smoking status: Current Every Day Smoker -- 1.00 packs/day    Types: Cigarettes  . Smokeless  tobacco: Never Used  . Alcohol Use: Yes     Comment: socially    Review of Systems  Psychiatric/Behavioral: The patient is nervous/anxious.   All other systems reviewed and are negative.   A complete 10 system review of systems was obtained and all systems are negative except as noted in the HPI and PMH.    Allergies  Review of patient's allergies indicates no known allergies.  Home Medications   Current Outpatient Rx  Name  Route  Sig  Dispense  Refill  . buprenorphine-naloxone (SUBOXONE) 8-2 MG SUBL SL tablet   Sublingual   Place 1 tablet under the tongue daily.         . diphenhydrAMINE (BENADRYL) 25 mg capsule   Oral   Take 25 mg by mouth every 6 (six) hours as needed for itching.         Marland Kitchen ALPRAZolam (XANAX) 0.5 MG tablet   Oral   Take 0.5 mg by mouth 3 (three) times daily.         Marland Kitchen dicyclomine (BENTYL) 20 MG tablet   Oral   Take 1 tablet (20 mg total) by mouth 2 (two) times daily.   20 tablet   0   . hydrOXYzine (ATARAX/VISTARIL) 25 MG tablet   Oral   Take 1 tablet (25 mg total) by mouth every 6 (six) hours.   12 tablet   0   . ondansetron (ZOFRAN) 4 MG  tablet   Oral   Take 1 tablet (4 mg total) by mouth every 6 (six) hours.   12 tablet   0    BP 127/88  Pulse 91  Temp(Src) 98.2 F (36.8 C) (Oral)  Resp 20  Ht 6' (1.829 m)  Wt 148 lb (67.132 kg)  BMI 20.07 kg/m2  SpO2 99% Physical Exam  Nursing note and vitals reviewed. Constitutional: He is oriented to person, place, and time. He appears well-developed and well-nourished. No distress.  HENT:  Head: Normocephalic and atraumatic.  Mouth/Throat: Oropharynx is clear and moist. No oropharyngeal exudate.  Eyes: Conjunctivae and EOM are normal. No scleral icterus.  Neck: Normal range of motion. Neck supple. No tracheal deviation present.  Cardiovascular: Normal rate and regular rhythm.   No murmur heard. Pulmonary/Chest: Effort normal and breath sounds normal. No respiratory distress.   Abdominal: Soft. There is no tenderness.  Musculoskeletal: Normal range of motion.  Neurological: He is alert and oriented to person, place, and time.  Skin: Skin is warm and dry. No rash noted.  Psychiatric: His behavior is normal. His mood appears anxious. He expresses no homicidal and no suicidal ideation.    ED Course  Procedures (including critical care time) Medications  dicyclomine (BENTYL) tablet 20 mg (not administered)  hydrOXYzine (ATARAX/VISTARIL) tablet 25 mg (not administered)  loperamide (IMODIUM) capsule 2-4 mg (not administered)  methocarbamol (ROBAXIN) tablet 500 mg (not administered)  naproxen (NAPROSYN) tablet 500 mg (not administered)  ondansetron (ZOFRAN-ODT) disintegrating tablet 4 mg (not administered)  cloNIDine (CATAPRES) tablet 0.1 mg (0.1 mg Oral Given 02/28/13 2221)    Followed by  cloNIDine (CATAPRES) tablet 0.1 mg (not administered)    Followed by  cloNIDine (CATAPRES) tablet 0.1 mg (not administered)    Labs Review Labs Reviewed  COMPREHENSIVE METABOLIC PANEL - Abnormal; Notable for the following:    Potassium 3.4 (*)    Glucose, Bld 138 (*)    ALT 65 (*)    All other components within normal limits  SALICYLATE LEVEL - Abnormal; Notable for the following:    Salicylate Lvl 0.0 (*)    All other components within normal limits  URINE RAPID DRUG SCREEN (HOSP PERFORMED) - Abnormal; Notable for the following:    Tetrahydrocannabinol POSITIVE (*)    All other components within normal limits  CBC WITH DIFFERENTIAL  ACETAMINOPHEN LEVEL  ETHANOL   Imaging Review No results found.  MDM   1. Opiate abuse, continuous    Patient has history of narcotic abuse including injection of pain medications heroin. He was discharged from [facility] detox yesterday. He Berg he feels as the urge to use. He Berg that they did nothing for him at [facility].  Vitals stable, no distress. No evidence of active withdrawal.  Will treat symptoms with zofran and  clonidine. Explained to patient that inpatient detox facilities are limited.  He just left detox yesterday.  He Berg he has a friend who works with [employer] that he will talk to. He adamantly denies any suicidal or homicidal ideation. Contrary to triage note, patient Berg he would never hurt himself.  He is given outpatient resource guide and prescriptions for zofran, bentyl, and atarax. BP 127/88  Pulse 91  Temp(Src) 98.2 F (36.8 C) (Oral)  Resp 20  Ht 6' (1.829 m)  Wt 148 lb (67.132 kg)  BMI 20.07 kg/m2  SpO2 99%    I personally performed the services described in this documentation, which was scribed in my presence. The recorded information has  been reviewed and is accurate.   Glynn Octave, MD 02/28/13 2352

## 2013-02-28 NOTE — ED Notes (Signed)
Last used medication around 3:00pm today - crushed a " Roxy 30 " and injected it into vein and also smoked a couple of joints .

## 2013-10-15 ENCOUNTER — Emergency Department (HOSPITAL_COMMUNITY)
Admission: EM | Admit: 2013-10-15 | Discharge: 2013-10-17 | Disposition: A | Discharge status: Home / Self Care | Payer: PRIVATE HEALTH INSURANCE | Attending: Emergency Medicine | Admitting: Emergency Medicine

## 2013-10-15 ENCOUNTER — Encounter (HOSPITAL_COMMUNITY): Payer: Self-pay | Admitting: Emergency Medicine

## 2013-10-15 DIAGNOSIS — R45850 Homicidal ideations: Secondary | ICD-10-CM | POA: Insufficient documentation

## 2013-10-15 DIAGNOSIS — Z87440 Personal history of urinary (tract) infections: Secondary | ICD-10-CM | POA: Insufficient documentation

## 2013-10-15 DIAGNOSIS — F1910 Other psychoactive substance abuse, uncomplicated: Secondary | ICD-10-CM

## 2013-10-15 DIAGNOSIS — F1420 Cocaine dependence, uncomplicated: Secondary | ICD-10-CM | POA: Insufficient documentation

## 2013-10-15 DIAGNOSIS — F1120 Opioid dependence, uncomplicated: Secondary | ICD-10-CM | POA: Insufficient documentation

## 2013-10-15 DIAGNOSIS — G8929 Other chronic pain: Secondary | ICD-10-CM | POA: Insufficient documentation

## 2013-10-15 DIAGNOSIS — F17200 Nicotine dependence, unspecified, uncomplicated: Secondary | ICD-10-CM | POA: Insufficient documentation

## 2013-10-15 DIAGNOSIS — F3289 Other specified depressive episodes: Secondary | ICD-10-CM | POA: Insufficient documentation

## 2013-10-15 DIAGNOSIS — F329 Major depressive disorder, single episode, unspecified: Secondary | ICD-10-CM | POA: Insufficient documentation

## 2013-10-15 DIAGNOSIS — J45909 Unspecified asthma, uncomplicated: Secondary | ICD-10-CM | POA: Insufficient documentation

## 2013-10-15 DIAGNOSIS — Z87442 Personal history of urinary calculi: Secondary | ICD-10-CM | POA: Insufficient documentation

## 2013-10-15 DIAGNOSIS — Z8619 Personal history of other infectious and parasitic diseases: Secondary | ICD-10-CM | POA: Insufficient documentation

## 2013-10-15 DIAGNOSIS — F16951 Hallucinogen use, unspecified with hallucinogen-induced psychotic disorder with hallucinations: Secondary | ICD-10-CM | POA: Diagnosis present

## 2013-10-15 DIAGNOSIS — F19951 Other psychoactive substance use, unspecified with psychoactive substance-induced psychotic disorder with hallucinations: Secondary | ICD-10-CM

## 2013-10-15 DIAGNOSIS — R45851 Suicidal ideations: Secondary | ICD-10-CM | POA: Insufficient documentation

## 2013-10-15 DIAGNOSIS — F1520 Other stimulant dependence, uncomplicated: Secondary | ICD-10-CM | POA: Insufficient documentation

## 2013-10-15 DIAGNOSIS — F411 Generalized anxiety disorder: Secondary | ICD-10-CM | POA: Insufficient documentation

## 2013-10-15 LAB — CBC
HEMATOCRIT: 45.5 % (ref 39.0–52.0)
Hemoglobin: 16.5 g/dL (ref 13.0–17.0)
MCH: 31.2 pg (ref 26.0–34.0)
MCHC: 36.3 g/dL — AB (ref 30.0–36.0)
MCV: 86 fL (ref 78.0–100.0)
Platelets: 189 10*3/uL (ref 150–400)
RBC: 5.29 MIL/uL (ref 4.22–5.81)
RDW: 12.2 % (ref 11.5–15.5)
WBC: 10.5 10*3/uL (ref 4.0–10.5)

## 2013-10-15 LAB — ETHANOL

## 2013-10-15 LAB — BASIC METABOLIC PANEL
BUN: 9 mg/dL (ref 6–23)
CHLORIDE: 98 meq/L (ref 96–112)
CO2: 26 mEq/L (ref 19–32)
CREATININE: 0.72 mg/dL (ref 0.50–1.35)
Calcium: 9.8 mg/dL (ref 8.4–10.5)
GFR calc Af Amer: >90 mL/min (ref >90)
Glucose, Bld: 98 mg/dL (ref 70–99)
Potassium: 3.7 mEq/L (ref 3.7–5.3)
Sodium: 139 mEq/L (ref 137–147)

## 2013-10-15 LAB — RAPID URINE DRUG SCREEN, HOSP PERFORMED
AMPHETAMINES: POSITIVE — AB
Barbiturates: NOT DETECTED
Benzodiazepines: NOT DETECTED
Cocaine: NOT DETECTED
Opiates: POSITIVE — AB
Tetrahydrocannabinol: POSITIVE — AB

## 2013-10-15 MED ORDER — CLONIDINE HCL 0.1 MG PO TABS: 0.1000 mg | ORAL_TABLET | ORAL | Status: DC

## 2013-10-15 MED ORDER — LORAZEPAM 1 MG PO TABS
1.0000 mg | ORAL_TABLET | Freq: Once | ORAL | Status: AC
  Administered 2013-10-15: 1 mg via ORAL
  Filled 2013-10-15: qty 1

## 2013-10-15 MED ORDER — HYDROXYZINE HCL 25 MG PO TABS
25.0000 mg | ORAL_TABLET | Freq: Four times a day (QID) | ORAL | Status: DC | PRN
  Administered 2013-10-16 (×2): 25 mg via ORAL
  Filled 2013-10-15 (×2): qty 1

## 2013-10-15 MED ORDER — CLONIDINE HCL 0.1 MG PO TABS: 0.1000 mg | ORAL_TABLET | Freq: Every day | ORAL | Status: DC

## 2013-10-15 MED ORDER — LOPERAMIDE HCL 2 MG PO CAPS: 2.0000 mg | ORAL_CAPSULE | ORAL | Status: DC | PRN

## 2013-10-15 MED ORDER — CLONIDINE HCL 0.1 MG PO TABS
0.1000 mg | ORAL_TABLET | Freq: Four times a day (QID) | ORAL | Status: DC
  Administered 2013-10-16 (×2): 0.1 mg via ORAL
  Filled 2013-10-15 (×2): qty 1

## 2013-10-15 MED ORDER — DICYCLOMINE HCL 20 MG PO TABS
20.0000 mg | ORAL_TABLET | Freq: Four times a day (QID) | ORAL | Status: DC | PRN
  Administered 2013-10-16 (×2): 20 mg via ORAL
  Filled 2013-10-15 (×2): qty 1

## 2013-10-15 MED ORDER — ONDANSETRON 4 MG PO TBDP
4.0000 mg | ORAL_TABLET | Freq: Four times a day (QID) | ORAL | Status: DC | PRN
  Administered 2013-10-16 (×2): 4 mg via ORAL
  Filled 2013-10-15 (×2): qty 1

## 2013-10-15 MED ORDER — METHOCARBAMOL 500 MG PO TABS
500.0000 mg | ORAL_TABLET | Freq: Three times a day (TID) | ORAL | Status: DC | PRN
  Administered 2013-10-16: 500 mg via ORAL
  Filled 2013-10-15: qty 1

## 2013-10-15 MED ORDER — NAPROXEN 500 MG PO TABS
500.0000 mg | ORAL_TABLET | Freq: Two times a day (BID) | ORAL | Status: DC | PRN
  Administered 2013-10-16: 500 mg via ORAL
  Filled 2013-10-15: qty 1

## 2013-10-15 NOTE — ED Notes (Signed)
Patient and belongings searched by security at this time.

## 2013-10-15 NOTE — ED Notes (Signed)
Patient gave urine sample with no problems. Patient returned to his room to lay in bed. No acute distress noted.

## 2013-10-15 NOTE — ED Notes (Signed)
Pt encouraged to give urine sample.  Pt refused at this time.  Will continue to encourage Pt to provide sample.

## 2013-10-15 NOTE — ED Notes (Signed)
Christopher NaJosh Geiplea,[name].

## 2013-10-15 NOTE — ED Provider Notes (Signed)
Medical screening examination/treatment/procedure(s) were performed by non-physician practitioner and as supervising physician I was immediately available for consultation/collaboration.   EKG Interpretation None       Kagan Hietpas R. Avelardo Reesman, MD 10/15/13 2333 

## 2013-10-15 NOTE — Progress Notes (Signed)
Writer attempted to assess the patient on two separate occassions and the patient was not able to speak.  Writer consulted with the PA, Josh regarding the patient.

## 2013-10-15 NOTE — ED Provider Notes (Signed)
CSN: [id number]     Arrival date & time 10/15/13  1727 History  This chart was scribed for non-physician practitioner, Renne CriglerJoshua Ephrem Carrick , working with Juliet RudeNathan R. Rubin PayorPickering, MD, by Tana ConchStephen Methvin ED Scribe. This patient was seen in [facility] and the patient's care was started at 5:45 PM.    Chief Complaint  Patient presents with  . Addiction Problem      The history is provided by the patient. No language interpreter was used.    HPI Comments: Christopher Berg is a 33 y.o. male who presents to the Emergency Department for an addiction problem, he has been using methamphetamines and LSD, some heroin and some alcohol. Pt reports that he is still hallucinating from the drugs and that he has been trying to deal with some emotional turmoil, his emotional trouble is what triggered him pushing his drug use. He has been using heavier and heavier amounts, "pushing the limit". He used LSD at 4:30PM and immediately shot some heroin. He also states that he has been pushing drugs further than he would and he has not slept in the last week. He had been going to the methadone clinic, but "was not in form to go today" and that's when he decided to shoot some heroin. He states that he has not used LSD very much in the last few years, today was the first time in a while. Does not voice discreet SI. No HI.   He states that he has been "pretty wide open over the last 7 days" and has not been eating, drink as he should.   Past Medical History  Diagnosis Date  . Hepatitis C infection   . Depression   . Anxiety   . Asthma     "as a child"  . Kidney stone     hx of  . Urinary tract infection     hx of  . Headache(784.0)     "occas"  . Chronic neck pain    Past Surgical History  Procedure Laterality Date  . No past surgeries    . Anterior cervical decomp/discectomy fusion N/A 08/08/2012    Procedure: Incision, drainage and debridement of C5-6 prevertebral and epidural abscess with fusion and left iliac crest  bone graft;  Surgeon: Kerrin ChampagneJames E [name], MD;  Location: [facility] OR;  Service: Orthopedics;  Laterality: N/A;   No family history on file. History  Substance Use Topics  . Smoking status: Current Every Day Smoker -- 1.00 packs/day    Types: Cigarettes  . Smokeless tobacco: Never Used  . Alcohol Use: Yes     Comment: socially    Review of Systems  Constitutional: Negative for fever.  HENT: Negative for rhinorrhea and sore throat.   Eyes: Negative for redness.  Respiratory: Negative for cough.   Cardiovascular: Negative for chest pain.  Gastrointestinal: Negative for nausea, vomiting, abdominal pain and diarrhea.  Genitourinary: Negative for dysuria.  Musculoskeletal: Negative for myalgias.  Skin: Negative for rash.  Neurological: Negative for headaches.  Psychiatric/Behavioral: Positive for hallucinations and sleep disturbance. Negative for suicidal ideas. The patient is nervous/anxious.       Allergies  Review of patient's allergies indicates no known allergies.  Home Medications   Prior to Admission medications   Medication Sig Start Date End Date Taking? Authorizing Provider  ALPRAZolam Prudy Feeler(XANAX) 0.5 MG tablet Take 0.5 mg by mouth 3 (three) times daily.    Historical Provider, MD  buprenorphine-naloxone (SUBOXONE) 8-2 MG SUBL SL tablet Place 1 tablet under the  tongue daily.    Historical Provider, MD  dicyclomine (BENTYL) 20 MG tablet Take 1 tablet (20 mg total) by mouth 2 (two) times daily. 02/28/13   Glynn OctaveStephen Rancour, MD  diphenhydrAMINE (BENADRYL) 25 mg capsule Take 25 mg by mouth every 6 (six) hours as needed for itching.    Historical Provider, MD  hydrOXYzine (ATARAX/VISTARIL) 25 MG tablet Take 1 tablet (25 mg total) by mouth every 6 (six) hours. 02/28/13   Glynn OctaveStephen Rancour, MD  ondansetron (ZOFRAN) 4 MG tablet Take 1 tablet (4 mg total) by mouth every 6 (six) hours. 02/28/13   Glynn OctaveStephen Rancour, MD   BP 132/98  Pulse 97  Temp(Src) 97.8 F (36.6 C) (Oral)  Resp 20  SpO2  98% Physical Exam  Nursing note and vitals reviewed. Constitutional: He is oriented to person, place, and time. He appears well-developed and well-nourished.  Patient will not sit still during interview. He does not make eye contact.   HENT:  Head: Normocephalic and atraumatic.  Eyes: Conjunctivae and EOM are normal. Right eye exhibits no discharge. Left eye exhibits no discharge.  Neck: Normal range of motion. Neck supple.  Cardiovascular: Normal rate, regular rhythm and normal heart sounds.   Pulmonary/Chest: Effort normal and breath sounds normal.  Abdominal: Soft. He exhibits no distension. There is no tenderness.  Musculoskeletal: Normal range of motion.  Neurological: He is alert and oriented to person, place, and time.  Skin: Skin is warm and dry.  Track marks L upper arm without signs of infection.   Psychiatric: His speech is normal. His mood appears anxious. He is agitated. He expresses impulsivity. He exhibits a depressed mood. He expresses no homicidal ideation.    ED Course  Procedures (including critical care time)  DIAGNOSTIC STUDIES: Oxygen Saturation is 98% on RA, normlal by my interpretation.    COORDINATION OF CARE:    5:52 PM-Discussed treatment plan which includes labs, specialist consultation with pt at bedside and pt agreed to plan.   Labs Review Labs Reviewed  CBC - Abnormal; Notable for the following:    MCHC 36.3 (*)    All other components within normal limits  BASIC METABOLIC PANEL  ETHANOL  URINE RAPID DRUG SCREEN (HOSP PERFORMED)    Imaging Review No results found.   EKG Interpretation None      5:58 PM Patient seen and examined. Work-up initiated. Medications ordered. Will have patient evaluated by [facility]. He does not voice suicidal ideation, but he is going through an emotional crisis which is causing him to use dangerous amount of drugs. I am concerned that he could cause harm to himself without intervention.   Vital signs reviewed and  are as follows: Filed Vitals:   10/15/13 1736  BP: 132/98  Pulse: 97  Temp: 97.8 F (36.6 C)  Resp: 20    MDM   Final diagnoses:  Polysubstance abuse    Patient awaiting [facility] eval, he is medically cleared.   I personally performed the services described in this documentation, which was scribed in my presence. The recorded information has been reviewed and is accurate.    Renne CriglerJoshua Kebrina Friend, PA-C 10/15/13 1921

## 2013-10-15 NOTE — ED Notes (Signed)
Report called to Psych ED

## 2013-10-15 NOTE — ED Notes (Signed)
Patient in restroom trying to obtain urine sample.

## 2013-10-15 NOTE — ED Notes (Signed)
Patient aware that ua sample is needed.Unable to provide at this time.

## 2013-10-15 NOTE — ED Notes (Signed)
Pt alert, arrives from home, c/o "i took LSD this am, i took heroin also", pt unsure why he wants to be here, denies SI/HI, resp even unlabored, skin pwd

## 2013-10-16 ENCOUNTER — Encounter (HOSPITAL_COMMUNITY): Payer: Self-pay | Admitting: Psychiatry

## 2013-10-16 ENCOUNTER — Inpatient Hospital Stay (HOSPITAL_COMMUNITY): Admission: AD | Admit: 2013-10-16 | Payer: PRIVATE HEALTH INSURANCE | Source: Intra-hospital

## 2013-10-16 DIAGNOSIS — F16951 Hallucinogen use, unspecified with hallucinogen-induced psychotic disorder with hallucinations: Secondary | ICD-10-CM | POA: Diagnosis present

## 2013-10-16 DIAGNOSIS — F1999 Other psychoactive substance use, unspecified with unspecified psychoactive substance-induced disorder: Secondary | ICD-10-CM

## 2013-10-16 DIAGNOSIS — F1910 Other psychoactive substance abuse, uncomplicated: Secondary | ICD-10-CM

## 2013-10-16 NOTE — Consult Note (Signed)
[facility] Face-to-Face Psychiatry Consult   Reason for Consult:  Drug induced psychosis Referring Physician:  [name] is an 33 y.o. male. Total Time spent with patient: 20 minutes  Assessment: AXIS I:  Substance Abuse; drug-induced psychosis AXIS II:  Deferred AXIS III:   Past Medical History  Diagnosis Date  . Hepatitis C infection   . Depression   . Anxiety   . Asthma     "as a child"  . Kidney stone     hx of  . Urinary tract infection     hx of  . Headache(784.0)     "occas"  . Chronic neck pain    AXIS IV:  other psychosocial or environmental problems, problems related to social environment and problems with primary support group AXIS V:  61-70 mild symptoms  Plan:  No evidence of imminent risk to self or others at present.  Dr. Louretta Shorten assessed the patient and concurs with the plan.  Subjective:   Christopher Berg is a 33 y.o. male patient does not warrant admission.  HPI:  Patient states he feels better today with more clear thinking.  Continues to feel the effects of the LSD.  States that he continues to see trails visually.  Patient denies current suicidal or homicidal ideation.  Patient is clear and coherent, with goal directed speech.  Patient does not appear to be attending to internal stimuli.  Patient does endorse nausea related to withdrawal from opiates.  Encouraged fluids.  Has spent the day sleeping and resting.  Denies chronic use of ETOH or withdrawal symptoms.  Patient denies current desire to attend rehabilitation states "i have attended rehab six times and it does not work for me."   HPI Elements:   Location:  generalized. Quality:  acute. Severity:  mild to moderate. Timing:  intermittent. Duration:  few hours. Context:  use of LSD and other drugs.  Past Psychiatric History: Past Medical History  Diagnosis Date  . Hepatitis C infection   . Depression   . Anxiety   . Asthma     "as a child"  . Kidney stone     hx of  . Urinary tract  infection     hx of  . Headache(784.0)     "occas"  . Chronic neck pain     reports that he has been smoking Cigarettes.  He has been smoking about 1.00 pack per day. He has never used smokeless tobacco. He reports that he drinks alcohol. He reports that he uses illicit drugs (Marijuana). History reviewed. No pertinent family history.         Allergies:  No Known Allergies  ACT Assessment Complete:  Yes:    Educational Status    Risk to Self: Risk to self Is patient at risk for suicide?: No Substance abuse history and/or treatment for substance abuse?: Yes (UDS positive for opiates, THC, amphetamines     BAL<11)  Risk to Others:    Abuse:    Prior Inpatient Therapy:    Prior Outpatient Therapy:    Additional Information:                    Objective: Blood pressure 100/62, pulse 82, temperature 97.6 F (36.4 C), temperature source Oral, resp. rate 16, SpO2 96.00%.There is no weight on file to calculate BMI. Results for orders placed during the hospital encounter of 10/15/13 (from the past 72 hour(s))  CBC     Status: Abnormal   Collection  Time    10/15/13  6:05 PM      Result Value Ref Range   WBC 10.5  4.0 - 10.5 K/uL   RBC 5.29  4.22 - 5.81 MIL/uL   Hemoglobin 16.5  13.0 - 17.0 g/dL   HCT 45.5  39.0 - 52.0 %   MCV 86.0  78.0 - 100.0 fL   MCH 31.2  26.0 - 34.0 pg   MCHC 36.3 (*) 30.0 - 36.0 g/dL   RDW 12.2  11.5 - 15.5 %   Platelets 189  150 - 400 K/uL  BASIC METABOLIC PANEL     Status: None   Collection Time    10/15/13  6:05 PM      Result Value Ref Range   Sodium 139  137 - 147 mEq/L   Potassium 3.7  3.7 - 5.3 mEq/L   Chloride 98  96 - 112 mEq/L   CO2 26  19 - 32 mEq/L   Glucose, Bld 98  70 - 99 mg/dL   BUN 9  6 - 23 mg/dL   Creatinine, Ser 0.72  0.50 - 1.35 mg/dL   Calcium 9.8  8.4 - 10.5 mg/dL   GFR calc non Af Amer >90  >90 mL/min   GFR calc Af Amer >90  >90 mL/min   Comment: (NOTE)     The eGFR has been calculated using the CKD EPI  equation.     This calculation has not been validated in all clinical situations.     eGFR's persistently <90 mL/min signify possible Chronic Kidney     Disease.  ETHANOL     Status: None   Collection Time    10/15/13  6:05 PM      Result Value Ref Range   Alcohol, Ethyl (B) <11  0 - 11 mg/dL   Comment:            LOWEST DETECTABLE LIMIT FOR     SERUM ALCOHOL IS 11 mg/dL     FOR MEDICAL PURPOSES ONLY  URINE RAPID DRUG SCREEN (HOSP PERFORMED)     Status: Abnormal   Collection Time    10/15/13  9:55 PM      Result Value Ref Range   Opiates POSITIVE (*) NONE DETECTED   Cocaine NONE DETECTED  NONE DETECTED   Benzodiazepines NONE DETECTED  NONE DETECTED   Amphetamines POSITIVE (*) NONE DETECTED   Tetrahydrocannabinol POSITIVE (*) NONE DETECTED   Barbiturates NONE DETECTED  NONE DETECTED   Comment:            DRUG SCREEN FOR MEDICAL PURPOSES     ONLY.  IF CONFIRMATION IS NEEDED     FOR ANY PURPOSE, NOTIFY LAB     WITHIN 5 DAYS.                LOWEST DETECTABLE LIMITS     FOR URINE DRUG SCREEN     Drug Class       Cutoff (ng/mL)     Amphetamine      1000     Barbiturate      200     Benzodiazepine   482     Tricyclics       707     Opiates          300     Cocaine          300     THC  32   Labs are reviewed and are pertinent for no medical issues.  Current Facility-Administered Medications  Medication Dose Route Frequency Provider Last Rate Last Dose  . cloNIDine (CATAPRES) tablet 0.1 mg  0.1 mg Oral QID Lurena Nida, NP   0.1 mg at 10/16/13 1029   Followed by  . [START ON 10/18/2013] cloNIDine (CATAPRES) tablet 0.1 mg  0.1 mg Oral BH-qamhs Lurena Nida, NP       Followed by  . [START ON 10/21/2013] cloNIDine (CATAPRES) tablet 0.1 mg  0.1 mg Oral QAC breakfast Lurena Nida, NP      . dicyclomine (BENTYL) tablet 20 mg  20 mg Oral Q6H PRN Carlisle Cater, PA-C   20 mg at 10/16/13 1030  . hydrOXYzine (ATARAX/VISTARIL) tablet 25 mg  25 mg Oral Q6H PRN Carlisle Cater,  PA-C   25 mg at 10/16/13 1030  . loperamide (IMODIUM) capsule 2-4 mg  2-4 mg Oral PRN Carlisle Cater, PA-C      . methocarbamol (ROBAXIN) tablet 500 mg  500 mg Oral Q8H PRN Carlisle Cater, PA-C      . naproxen (NAPROSYN) tablet 500 mg  500 mg Oral BID PRN Carlisle Cater, PA-C   500 mg at 10/16/13 1030  . ondansetron (ZOFRAN-ODT) disintegrating tablet 4 mg  4 mg Oral Q6H PRN Carlisle Cater, PA-C   4 mg at 10/16/13 1030   Current Outpatient Prescriptions  Medication Sig Dispense Refill  . diphenhydrAMINE (BENADRYL) 25 mg capsule Take 25 mg by mouth every 6 (six) hours as needed for itching.      . hydrOXYzine (ATARAX/VISTARIL) 25 MG tablet Take 1 tablet (25 mg total) by mouth every 6 (six) hours.  12 tablet  0  . ondansetron (ZOFRAN) 4 MG tablet Take 1 tablet (4 mg total) by mouth every 6 (six) hours.  12 tablet  0    Psychiatric Specialty Exam:     Blood pressure 100/62, pulse 82, temperature 97.6 F (36.4 C), temperature source Oral, resp. rate 16, SpO2 96.00%.There is no weight on file to calculate BMI.  General Appearance: Disheveled  Eye Sport and exercise psychologist::  Fair  Speech:  Normal Rate  Volume:  Normal  Mood:  Euthymic  Affect:  Congruent  Thought Process:  Coherent, "stutterring in my head"  Orientation:  Full (Time, Place, and Person)  Thought Content:  sees visual trails from LSD use  Suicidal Thoughts:  No  Homicidal Thoughts:  No  Memory:  Immediate;   Good Recent;   Good Remote;   Good  Judgement:  Fair  Insight:  Fair  Psychomotor Activity:  Decreased  Concentration:  Fair  Recall:  Good  Fund of Knowledge:Good  Language: Good  Akathisia:  No  Handed:  Right  AIMS (if indicated):     Assets:  Communication Skills Leisure Time Physical Health Resilience Social Support Transportation  Sleep:      Musculoskeletal: Strength & Muscle Tone: within normal limits Gait & Station: normal Patient leans: N/A  Treatment Plan Summary: Discharge home with follow-up with an  outpatient provider, support groups encouraged.  Waylan Boga, PMH-NP 10/16/2013 3:51 PM  Patient was seen face to face with physician extender and formulated treatment plan. Reviewed the information documented and agree with the treatment plan.  Durward Parcel 10/17/2013 4:38 PM

## 2013-10-16 NOTE — ED Notes (Addendum)
Pt c/o anxiety, agitation, abdominal pain, nausea and cramps. PRN medications given as ordered. Pt pleasant and cooperative with care. No distress noted. Respirations regular and unlabored. Skin warm and dry. Pt denies SI or plans to harm himself at this time.

## 2013-10-17 DIAGNOSIS — F19951 Other psychoactive substance use, unspecified with psychoactive substance-induced psychotic disorder with hallucinations: Secondary | ICD-10-CM

## 2013-10-17 MED ORDER — HYDROXYZINE HCL 25 MG PO TABS: 25.0000 mg | ORAL_TABLET | Freq: Four times a day (QID) | ORAL | Status: DC | PRN

## 2013-10-17 MED ORDER — ONDANSETRON 4 MG PO TBDP: 4.0000 mg | ORAL_TABLET | Freq: Four times a day (QID) | ORAL | Status: DC | PRN

## 2013-10-17 NOTE — Discharge Instructions (Signed)
Heroin Abuse and Withdrawal Heroin is an illegal opiate (narcotic) drug. It is very addictive. Once the effects of the drug are felt, the need to use the drug again (known as craving) is strong. When the drug is suddenly stopped after using it on a regular basis, significant and painful physical withdrawal symptoms are experienced.  EFFECTS OF HEROIN USE AND ABUSE The drug produces a profound feeling of pleasure and relaxation (euphoria) that lasts for a short time. This is followed by sleepiness and then agitation with craving for more drugs. After a short time of regular use (days or weeks), dependence on the drug is developed. This means the user will become ill without it (withdrawal) and needs to keep using the drug to function. This is how fast heroin abuse becomes physical dependence and addiction (chemical dependency). EFFECTS ON THE BRAIN Heroin is addictive because it activates regions of the brain that are responsible for producing both the pleasurable sensation of "reward" and physical dependence. Together, these actions account for the user's loss of control and the rapid development of drug dependence. HOW IT IS USED  Heroin is a drug that is mostly taken by a shot with a needle (injection) in the vein. Using a needle can have harmful effects on the user. Often times, the needle is unclean (unsterile), the heroin is contaminated with cutting agents, or heroin is used in combination with other drugs such as alcohol or cocaine.  In recent years, the purity of street heroin has increased a lot allowing users to "snort" the drug into their noses where it is absorbed through the lining of the nasal passages. You can become addicted this way. Many IV heroin users report they started by snorting. RELATED PROBLEMS  Needle sharing can cause AIDS. The AIDS virus is carried in contaminated blood left in the needle, syringe or other drug-related equipment. When unclean needles are used, the AIDS virus  is injected into the new user. There is no cure or proven vaccine for AIDS to prevent it.  Heroin use during pregnancy is associated with stillbirths. It can also increase the chance for miscarriage. Babies born addicted to heroin must undergo withdrawal after birth. These babies show a number of developmental problems.  Heroin use can cause serious health problems such as liver infection (hepatitis), skin abscesses, vein inflammation and heart disease. SYMPTOMS The signs and symptoms of heroin use include:  Euphoria.  Drowsiness.  Respiratory depression (which can progress until breathing stops).  Small (constricted) pupils and nausea.  Need for increasingly higher doses of the drug to get the same effect.  Weight loss.  "Track" marks (scars) on arms and legs.  Emotional instability. Symptoms of a heroin overdose include:   Shallow breathing.  Pinpoint pupils.  Clammy skin.  Convulsion.  Coma. WITHDRAWAL EFFECTS Withdrawal symptoms include:  Watery eyes.  Runny nose.  Yawning.  Loss of appetite.  Tremors.  Panic.  Chills.  Sweating.  Nausea.  Muscle cramps.  Not being able to sleep well (insomnia).  Depression and mood swings.  Elevations in blood pressure, pulse, respiratory rate and temperature. RISK OF OVERDOSE Because the drug is produced illegally, heroin users risk taking an unusually potent dose (due to differences in purity). This can lead to overdose, coma and possible death. Especially dangerous is the situation where a user has been "clean" (abstinent) from using the drug for a period of time and then relapses. Fatal overdoses can occur because the user fails to account for the change in  the body's tolerance to the drug. TREATMENT  There are many treatment options for heroin addiction. They include medications as well as behavioral therapies. Research has demonstrated that many people can successfully stop using heroin when medication is  combined with other supportive services. Patients can return to stable and productive lives, living completely free of all narcotics. Some of the medications and other treatment approaches used are:  Methadone. This is a medication that blocks the effects of heroin for about 24 hours. It has a proven record of success when prescribed at a high enough dosage level for people addicted to heroin.  Naloxone may be used to treat cases of overdose, as well as naltrexone. Both of these block the effects of morphine, heroin and other opiates. People who have become free of narcotics often take naltrexone to avoid relapse.  Buprenorphine is now available for treating addiction to heroin and other opiates. This medication offers less risk of addiction. It can be dispensed in the privacy of a doctor's office.  Several other medications for use in heroin treatment programs are also under study.  There are many effective behavioral treatments available for heroin addiction. These can include residential and outpatient approaches. 12-step programs, such as Narcotics Anonymous, have also proven to be effective. HOME CARE INSTRUCTIONS  Some individuals can successfully stop using heroin at home with medically assisted detoxification. This requires following your caregiver's instructions very carefully. This may include use of some of the following medications and therapies:  Stomach medicine can be used for the nausea.  Imodium can be used for the diarrhea.  Benadryl can be used for the sleeplessness and restlessness.  Anxiety medication such as Xanax or Ativan. These must be administered exactly as prescribed, usually with the help of a home caregiver.  Avoiding dehydration by drinking more fluids than usual. While water alone is fine, any non-alcoholic fluid is okay (soup, juice, etc.).  Providing quiet, calm support and cooling or warmth as needed.  Call your local emergency services (911 in [place] if  seizures occur or if the patient is unable to keep liquids down.  Keep a written record of medications given and times given. Addiction cannot be cured but it can be treated successfully.   Treatment centers are listed in the yellow pages under:  Substance Abuse.  Narcotics Anonymous.  Drug Abuse Counseling.  Most hospitals and clinics can refer you to a specialized care center.  The [place] government maintains a toll-free number for obtaining treatment referrals:1-7751132441 or 708-017-65011-(913)191-7540 (TDD) and maintains a website: http://findtreatment.RockToxic.plsamhsa.gov.  Other websites for additional information are:  www.mentalhealth.[place].plsamhsa.gov.  GreatestFeeling.tnwww.nida.gov.  In [place] treatment resources are listed in each [place] with listings available under:  [facility]The [facility] [employer] or similar titles. Document Released: 05/23/2003 Document Revised: 08/12/2011 Document Reviewed: 05/06/2008 [facility] Patient Information 2014 [place], [place].  Polysubstance Abuse When people abuse more than one drug or type of drug it is called polysubstance or polydrug abuse. For example, many smokers also drink alcohol. This is one form of polydrug abuse. Polydrug abuse also refers to the use of a drug to counteract an unpleasant effect produced by another drug. It may also be used to help with withdrawal from another drug. People who take stimulants may become agitated. Sometimes this agitation is countered with a tranquilizer. This helps protect against the unpleasant side effects. Polydrug abuse also refers to the use of different drugs at the same time.  Anytime drug use is interfering with normal living activities, it has  become abuse. This includes problems with family and friends. Psychological dependence has developed when your mind tells you that the drug is needed. This is usually followed by physical dependence which has developed when continuing increases of drug are required to get the same feeling or  "high". This is known as addiction or chemical dependency. A person's risk is much higher if there is a history of chemical dependency in the family. SIGNS OF CHEMICAL DEPENDENCY  You have been told by friends or family that drugs have become a problem.  You fight when using drugs.  You are having blackouts (not remembering what you do while using).  You feel sick from using drugs but continue using.  You lie about use or amounts of drugs (chemicals) used.  You need chemicals to get you going.  You are suffering in work performance or in school because of drug use.  You get sick from use of drugs but continue to use anyway.  You need drugs to relate to people or feel comfortable in social situations.  You use drugs to forget problems. "Yes" answered to any of the above signs of chemical dependency indicates there are problems. The longer the use of drugs continues, the greater the problems will become. If there is a family history of drug or alcohol use, it is best not to experiment with these drugs. Continual use leads to tolerance. After tolerance develops more of the drug is needed to get the same feeling. This is followed by addiction. With addiction, drugs become the most important part of life. It becomes more important to take drugs than participate in the other usual activities of life. This includes relating to friends and family. Addiction is followed by dependency. Dependency is a condition where drugs are now needed not just to get high, but to feel normal. Addiction cannot be cured but it can be stopped. This often requires outside help and the care of professionals. Treatment centers are listed in the yellow pages under: Cocaine, Narcotics, and Alcoholics Anonymous. Most hospitals and clinics can refer you to a specialized care center. Talk to your caregiver if you need help. Document Released: 01/09/2005 Document Revised: 08/12/2011 Document Reviewed: 05/20/2005 [facility]  Patient Information 2014 [place], [place].

## 2013-10-17 NOTE — [facility] Suicide Risk Assessment (Signed)
Suicide Risk Assessment  Discharge Assessment     Demographic Factors:  Male, Caucasian and Low socioeconomic status  Total Time spent with patient: 20 minutes  Psychiatric Specialty Exam:     Blood pressure 117/80, pulse 69, temperature 97.7 F (36.5 C), temperature source Oral, resp. rate 18, SpO2 99.00%.There is no weight on file to calculate BMI.  General Appearance: Fairly Groomed  Patent attorneyye Contact::  Good  Speech:  Normal Rate  Volume:  Normal  Mood:  Euthymic  Affect:  Congruent  Thought Process:  Coherent  Orientation:  Full (Time, Place, and Person)  Thought Content:  WDL  Suicidal Thoughts:  No  Homicidal Thoughts:  No  Memory:  Immediate;   Good Recent;   Good Remote;   Good  Judgement:  Fair  Insight:  Fair  Psychomotor Activity:  Normal  Concentration:  Fair  Recall:  Good  Fund of Knowledge:Good  Language: Good  Akathisia:  No  Handed:  Right  AIMS (if indicated):     Assets:  Chief of StaffCommunication Skills Social Support Transportation  Sleep:       Musculoskeletal: Strength & Muscle Tone: within normal limits Gait & Station: normal Patient leans: N/A   Mental Status Per Nursing Assessment::   On Admission:  Patient was calm, cooperative, experiencing sweating, restlessness, joint aches, runny nose, nausea and anxiety related to opiate withdrawal. No HI or SI.  Current Mental Status by Physician: NA and Patient denies suicidal/homicidal ideations, plans, or intent  Loss Factors: NA  Historical Factors: Family history of mental illness or substance abuse  Risk Reduction Factors:   Living with another person, especially a relative and Positive social support  Continued Clinical Symptoms:  Alcohol/Substance Abuse/Dependencies  Cognitive Features That Contribute To Risk:  Thought constriction (tunnel vision)    Suicide Risk:  Minimal: No identifiable suicidal ideation.  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 Hepatitis C infection   . Depression   . Anxiety   . Asthma     "as a child"  . Kidney stone     hx of  . Urinary tract infection     hx of  . Headache(784.0)     "occas"  . Chronic neck pain    AXIS IV:  other psychosocial or environmental problems and problems related to social environment AXIS V:  61-70 mild symptoms  Plan Of Care/Follow-up recommendations:  Activity:  As tolerated  Diet:  Regular; advance as tolerated Tests:  As needed by PCP Other:  Follow-up with PCP as needed; Follow-up with an outpatient provider; support groups encouraged.  Is patient on multiple antipsychotic therapies at discharge:  No   Has Patient had three or more failed trials of antipsychotic monotherapy by history:  No  Recommended Plan for Multiple Antipsychotic Therapies: NA    Alberteen SamFran Kendricks Reap, FNP-BC 10/17/2013, 6:35 AM

## 2014-03-21 IMAGING — CT CT CERVICAL SPINE W/O CM
4 series · 17 of 33 positions shown, 20 images · non-contrast
Comparison: MRI 04/12/2009

CLINICAL DATA: Chronic neck pain.

CT CERVICAL SPINE WITHOUT CONTRAST
TECHNIQUE: Multidetector CT imaging of the cervical spine was
performed. Multiplanar CT image reconstructions were also
generated.

[Series 5: c_spine 2.0 b31s · axial · 0.23mm/px · z∈[+960,+1100]mm · 6 of 99 slices shown, 8 images]
[im 15/99  soft-tissue]
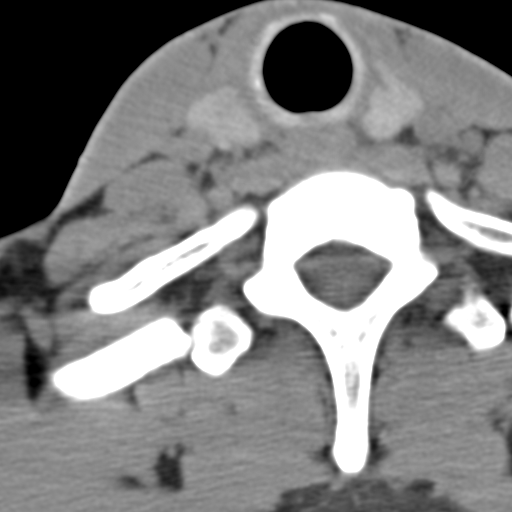
[im 15/99  bone]
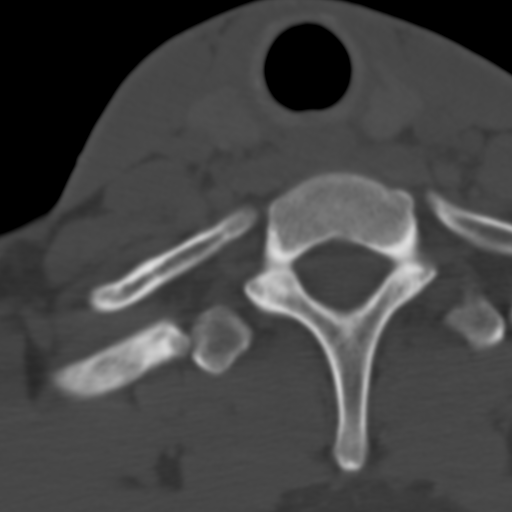
[im 29/99  bone]
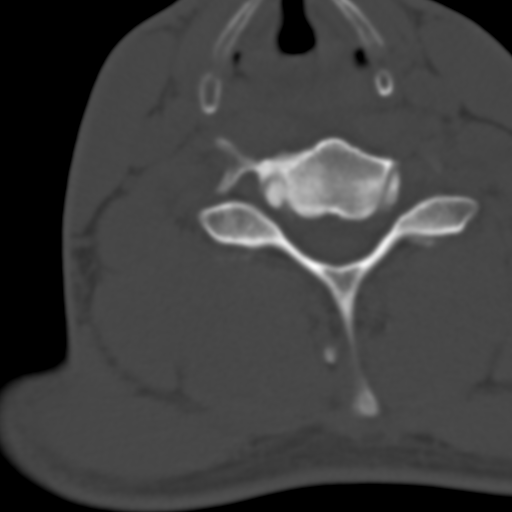
[im 43/99  bone]
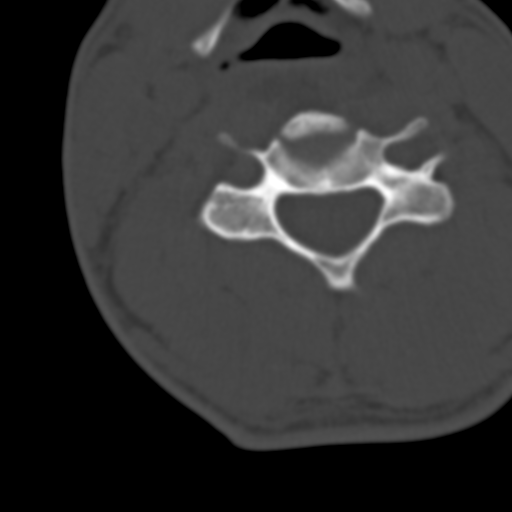
[im 57/99  bone]
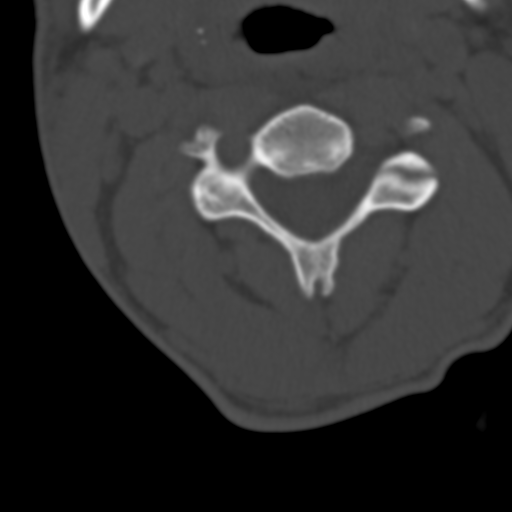
[im 71/99  soft-tissue]
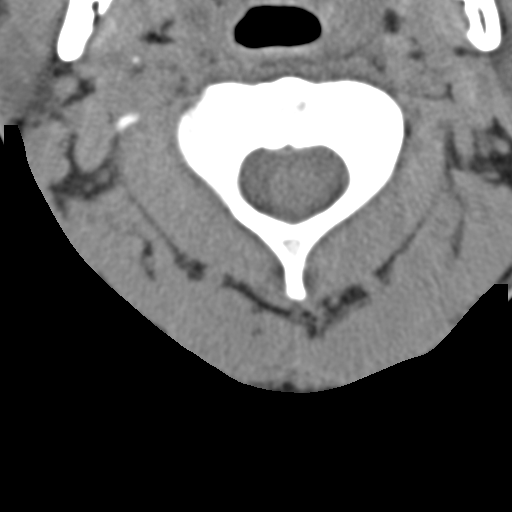
[im 71/99  bone]
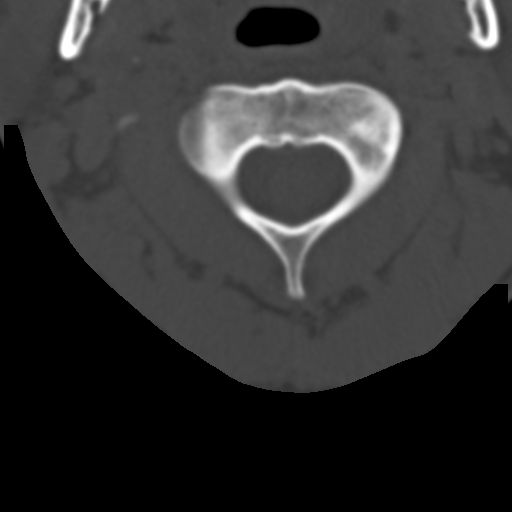
[im 85/99  bone]
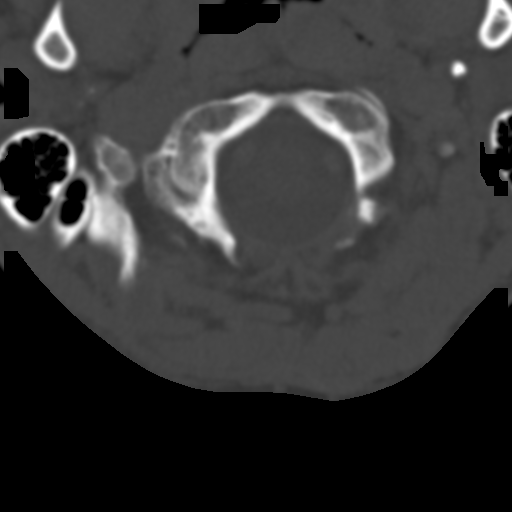

[Series 602: orthog · axial · 0.39mm/px · z∈[+943,+1007]mm · 3 of 85 slices shown]
[im 17/85  bone]
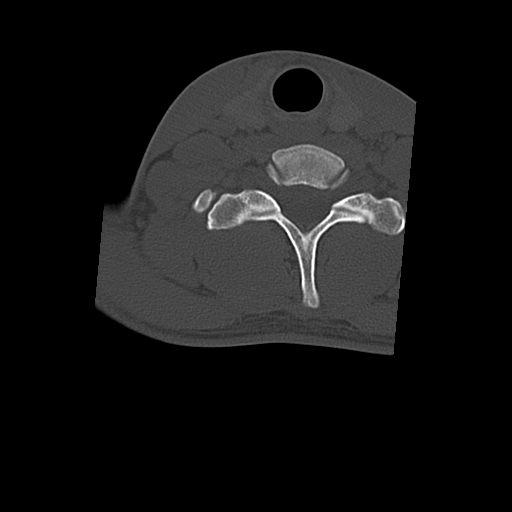
[im 34/85  bone]
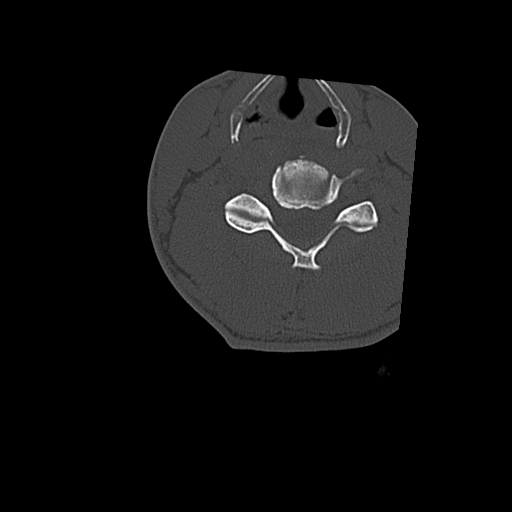
[im 51/85  bone]
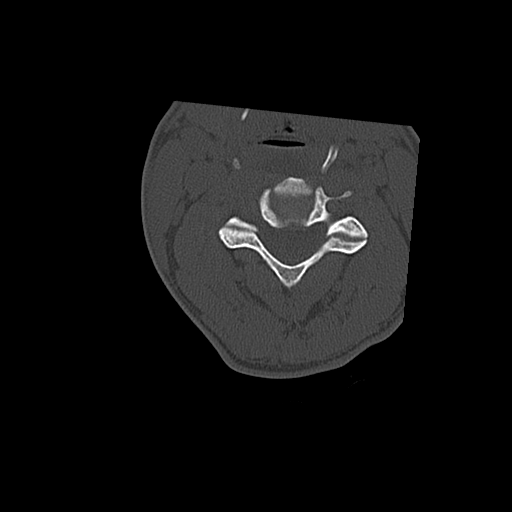

[Series 603: cor · coronal · 0.39mm/px · 3 of 46 slices shown]
[im 10/46  bone]
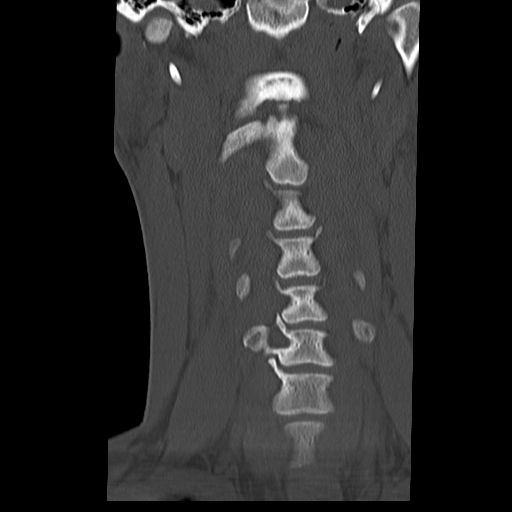
[im 19/46  bone]
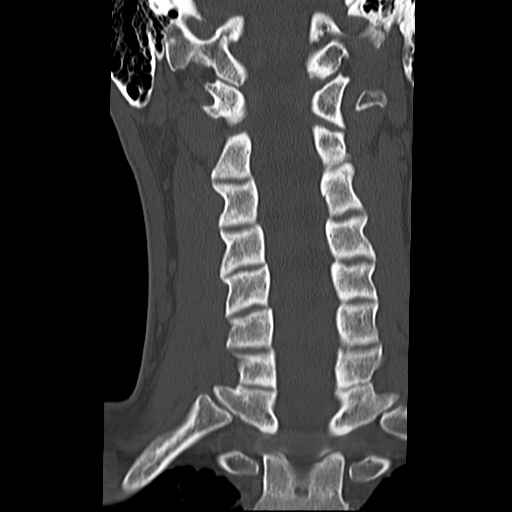
[im 28/46  bone]
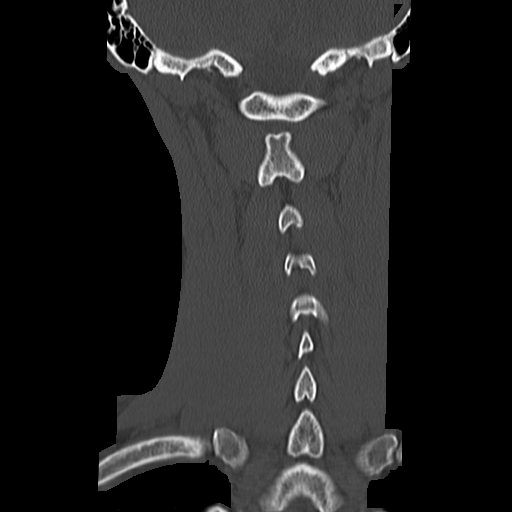

[Series 604: sag · sagittal · 0.39mm/px · 5 of 34 slices shown, 6 images]
[im 12/34  bone]
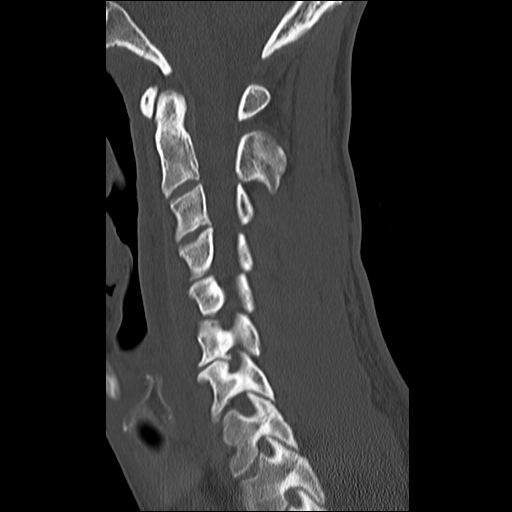
[im 14/34  bone]
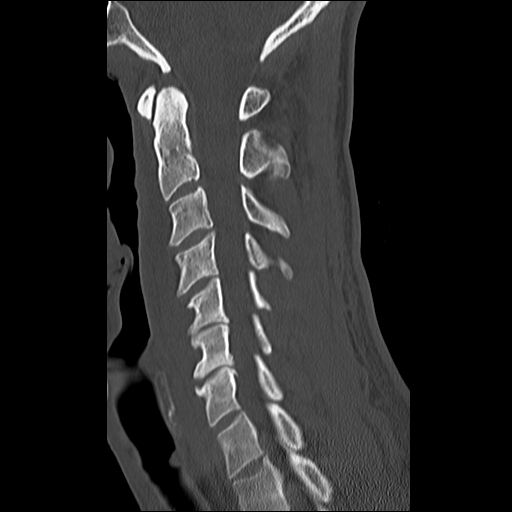
[im 17/34  soft-tissue]
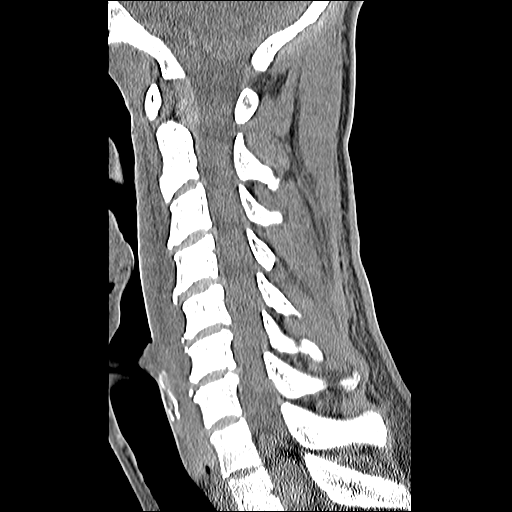
[im 17/34  bone]
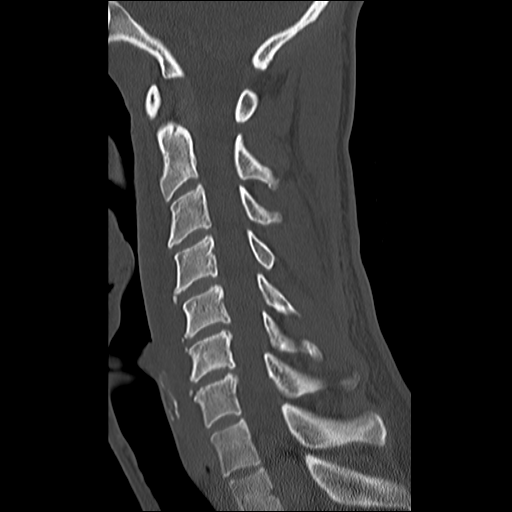
[im 20/34  bone]
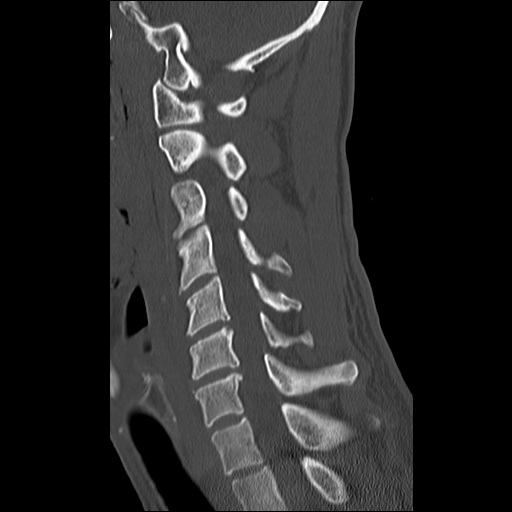
[im 23/34  bone]
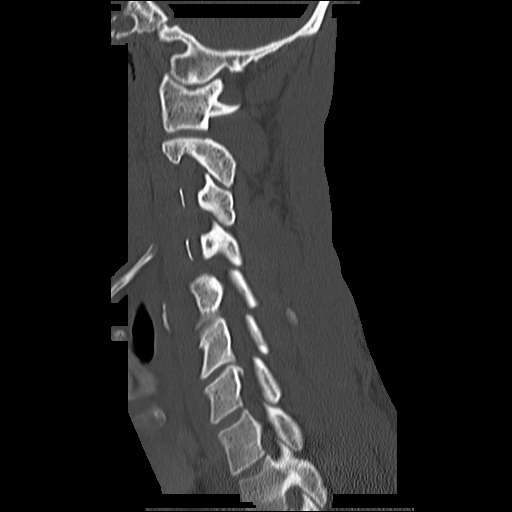

[17 of 33 positions shown; findings below may reference images not displayed]

FINDINGS: Normal alignment.  Mild degenerative disc disease changes
diffusely with disc space narrowing and spurring, most pronounced
at C5-6 and C6-7.  The previously described left paracentral disc
protrusion by MRI cannot be appreciated on CT and could be further
evaluated with elective MRI if felt clinically indicated.  No
fracture.  No epidural or paraspinal hematoma.
IMPRESSION: Degenerative changes.  No acute findings.

## 2014-06-16 ENCOUNTER — Encounter (HOSPITAL_COMMUNITY): Payer: Self-pay | Admitting: Specialist

## 2014-12-02 ENCOUNTER — Encounter (HOSPITAL_COMMUNITY)
Admission: EM | Discharge status: Against Medical Advice | Payer: Self-pay | Source: Home / Self Care | Attending: Internal Medicine

## 2014-12-02 ENCOUNTER — Inpatient Hospital Stay (HOSPITAL_COMMUNITY)
Admission: EM | Admit: 2014-12-02 | Discharge: 2014-12-04 | DRG: 581 | Discharge status: Against Medical Advice | Payer: Self-pay | Attending: Internal Medicine | Admitting: Internal Medicine

## 2014-12-02 ENCOUNTER — Encounter (HOSPITAL_COMMUNITY): Payer: Self-pay | Admitting: Emergency Medicine

## 2014-12-02 ENCOUNTER — Inpatient Hospital Stay (HOSPITAL_COMMUNITY): Payer: Self-pay | Admitting: Anesthesiology

## 2014-12-02 DIAGNOSIS — Z720 Tobacco use: Secondary | ICD-10-CM | POA: Diagnosis present

## 2014-12-02 DIAGNOSIS — M542 Cervicalgia: Secondary | ICD-10-CM | POA: Diagnosis present

## 2014-12-02 DIAGNOSIS — L03113 Cellulitis of right upper limb: Principal | ICD-10-CM | POA: Diagnosis present

## 2014-12-02 DIAGNOSIS — L02414 Cutaneous abscess of left upper limb: Secondary | ICD-10-CM | POA: Diagnosis present

## 2014-12-02 DIAGNOSIS — G8929 Other chronic pain: Secondary | ICD-10-CM | POA: Diagnosis present

## 2014-12-02 DIAGNOSIS — F329 Major depressive disorder, single episode, unspecified: Secondary | ICD-10-CM | POA: Diagnosis present

## 2014-12-02 DIAGNOSIS — F1910 Other psychoactive substance abuse, uncomplicated: Secondary | ICD-10-CM | POA: Diagnosis present

## 2014-12-02 DIAGNOSIS — L02413 Cutaneous abscess of right upper limb: Secondary | ICD-10-CM | POA: Diagnosis present

## 2014-12-02 DIAGNOSIS — F17210 Nicotine dependence, cigarettes, uncomplicated: Secondary | ICD-10-CM | POA: Diagnosis present

## 2014-12-02 DIAGNOSIS — Z981 Arthrodesis status: Secondary | ICD-10-CM

## 2014-12-02 DIAGNOSIS — Z590 Homelessness: Secondary | ICD-10-CM

## 2014-12-02 DIAGNOSIS — R739 Hyperglycemia, unspecified: Secondary | ICD-10-CM | POA: Diagnosis present

## 2014-12-02 DIAGNOSIS — L02519 Cutaneous abscess of unspecified hand: Secondary | ICD-10-CM | POA: Diagnosis present

## 2014-12-02 DIAGNOSIS — L03119 Cellulitis of unspecified part of limb: Secondary | ICD-10-CM

## 2014-12-02 DIAGNOSIS — F419 Anxiety disorder, unspecified: Secondary | ICD-10-CM | POA: Diagnosis present

## 2014-12-02 DIAGNOSIS — B1920 Unspecified viral hepatitis C without hepatic coma: Secondary | ICD-10-CM | POA: Diagnosis present

## 2014-12-02 HISTORY — PX: I&D EXTREMITY: SHX5045

## 2014-12-02 LAB — CBC WITH DIFFERENTIAL/PLATELET
BASOS ABS: 0 10*3/uL (ref 0.0–0.1)
BASOS PCT: 0 % (ref 0–1)
Eosinophils Absolute: 0.1 10*3/uL (ref 0.0–0.7)
Eosinophils Relative: 1 % (ref 0–5)
HCT: 45 % (ref 39.0–52.0)
Hemoglobin: 15.5 g/dL (ref 13.0–17.0)
LYMPHS ABS: 1.6 10*3/uL (ref 0.7–4.0)
LYMPHS PCT: 16 % (ref 12–46)
MCH: 30.5 pg (ref 26.0–34.0)
MCHC: 34.4 g/dL (ref 30.0–36.0)
MCV: 88.4 fL (ref 78.0–100.0)
MONO ABS: 0.7 10*3/uL (ref 0.1–1.0)
MONOS PCT: 7 % (ref 3–12)
NEUTROS PCT: 76 % (ref 43–77)
Neutro Abs: 8.1 10*3/uL — ABNORMAL HIGH (ref 1.7–7.7)
Platelets: 192 10*3/uL (ref 150–400)
RBC: 5.09 MIL/uL (ref 4.22–5.81)
RDW: 12.3 % (ref 11.5–15.5)
WBC: 10.6 10*3/uL — ABNORMAL HIGH (ref 4.0–10.5)

## 2014-12-02 LAB — BASIC METABOLIC PANEL
Anion gap: 9 (ref 5–15)
BUN: 9 mg/dL (ref 6–20)
CALCIUM: 9.3 mg/dL (ref 8.9–10.3)
CHLORIDE: 99 mmol/L — AB (ref 101–111)
CO2: 27 mmol/L (ref 22–32)
Creatinine, Ser: 0.82 mg/dL (ref 0.61–1.24)
GFR calc Af Amer: >60 mL/min (ref >60)
GLUCOSE: 147 mg/dL — AB (ref 65–99)
Potassium: 3.7 mmol/L (ref 3.5–5.1)
Sodium: 135 mmol/L (ref 135–145)

## 2014-12-02 SURGERY — IRRIGATION AND DEBRIDEMENT EXTREMITY
Anesthesia: General | Site: Arm Lower | Laterality: Right

## 2014-12-02 MED ORDER — LACTATED RINGERS IV SOLN
INTRAVENOUS | Status: DC | PRN
  Administered 2014-12-02: 19:00:00 via INTRAVENOUS

## 2014-12-02 MED ORDER — ONDANSETRON HCL 4 MG/2ML IJ SOLN
INTRAMUSCULAR | Status: DC | PRN
  Administered 2014-12-02: 4 mg via INTRAVENOUS

## 2014-12-02 MED ORDER — ONDANSETRON HCL 4 MG/2ML IJ SOLN
INTRAMUSCULAR | Status: AC
  Filled 2014-12-02: qty 2

## 2014-12-02 MED ORDER — PROPOFOL 10 MG/ML IV BOLUS
INTRAVENOUS | Status: AC
  Filled 2014-12-02: qty 20

## 2014-12-02 MED ORDER — OXYCODONE HCL 5 MG PO TABS
10.0000 mg | ORAL_TABLET | Freq: Once | ORAL | Status: AC
  Administered 2014-12-02: 10 mg via ORAL

## 2014-12-02 MED ORDER — ONDANSETRON HCL 4 MG/2ML IJ SOLN
INTRAMUSCULAR | Status: AC
Start: 2014-12-02 — End: 2014-12-03
  Filled 2014-12-02: qty 2

## 2014-12-02 MED ORDER — SUCCINYLCHOLINE CHLORIDE 20 MG/ML IJ SOLN
INTRAMUSCULAR | Status: DC | PRN
Start: 2014-12-02 — End: 2014-12-02
  Administered 2014-12-02: 120 mg via INTRAVENOUS

## 2014-12-02 MED ORDER — ACETAMINOPHEN 325 MG PO TABS
ORAL_TABLET | ORAL | Status: AC
  Administered 2014-12-02: 650 mg
  Filled 2014-12-02: qty 2

## 2014-12-02 MED ORDER — BUPIVACAINE HCL (PF) 0.25 % IJ SOLN: INTRAMUSCULAR | Status: DC | PRN

## 2014-12-02 MED ORDER — SODIUM CHLORIDE 0.9 % IV BOLUS (SEPSIS)
1000.0000 mL | Freq: Once | INTRAVENOUS | Status: AC
  Administered 2014-12-02: 1000 mL via INTRAVENOUS

## 2014-12-02 MED ORDER — PROMETHAZINE HCL 25 MG/ML IJ SOLN: 6.2500 mg | INTRAMUSCULAR | Status: DC | PRN

## 2014-12-02 MED ORDER — ACETAMINOPHEN 325 MG PO TABS: 650.0000 mg | ORAL_TABLET | Freq: Four times a day (QID) | ORAL | Status: DC | PRN

## 2014-12-02 MED ORDER — BUPIVACAINE HCL (PF) 0.25 % IJ SOLN
INTRAMUSCULAR | Status: AC
  Filled 2014-12-02: qty 30

## 2014-12-02 MED ORDER — ONDANSETRON HCL 4 MG/2ML IJ SOLN: 4.0000 mg | Freq: Four times a day (QID) | INTRAMUSCULAR | Status: DC | PRN

## 2014-12-02 MED ORDER — MIDAZOLAM HCL 2 MG/2ML IJ SOLN
INTRAMUSCULAR | Status: AC
  Filled 2014-12-02: qty 2

## 2014-12-02 MED ORDER — OXYCODONE HCL 5 MG PO TABS
ORAL_TABLET | ORAL | Status: AC
  Administered 2014-12-02: 10 mg via ORAL
  Filled 2014-12-02: qty 2

## 2014-12-02 MED ORDER — METHOCARBAMOL 500 MG PO TABS
ORAL_TABLET | ORAL | Status: AC
  Administered 2014-12-02: 500 mg via ORAL
  Filled 2014-12-02: qty 1

## 2014-12-02 MED ORDER — METHOCARBAMOL 500 MG PO TABS
500.0000 mg | ORAL_TABLET | Freq: Once | ORAL | Status: AC
  Administered 2014-12-02: 500 mg via ORAL

## 2014-12-02 MED ORDER — OXYCODONE HCL 5 MG PO TABS
5.0000 mg | ORAL_TABLET | ORAL | Status: DC | PRN
Start: 2014-12-02 — End: 2014-12-04
  Administered 2014-12-03 – 2014-12-04 (×6): 5 mg via ORAL
  Filled 2014-12-02 (×6): qty 1

## 2014-12-02 MED ORDER — MIDAZOLAM HCL 5 MG/5ML IJ SOLN
INTRAMUSCULAR | Status: DC | PRN
  Administered 2014-12-02: 2 mg via INTRAVENOUS

## 2014-12-02 MED ORDER — ONDANSETRON HCL 4 MG PO TABS: 4.0000 mg | ORAL_TABLET | Freq: Four times a day (QID) | ORAL | Status: DC | PRN

## 2014-12-02 MED ORDER — SODIUM CHLORIDE 0.9 % IV SOLN
INTRAVENOUS | Status: DC
  Administered 2014-12-02 – 2014-12-03 (×3): via INTRAVENOUS

## 2014-12-02 MED ORDER — HYDROMORPHONE HCL 1 MG/ML IJ SOLN
0.2500 mg | INTRAMUSCULAR | Status: DC | PRN
  Administered 2014-12-02 (×6): 0.5 mg via INTRAVENOUS

## 2014-12-02 MED ORDER — LORAZEPAM 1 MG PO TABS
1.0000 mg | ORAL_TABLET | Freq: Four times a day (QID) | ORAL | Status: DC | PRN
  Administered 2014-12-02 – 2014-12-03 (×3): 1 mg via ORAL
  Filled 2014-12-02 (×3): qty 1

## 2014-12-02 MED ORDER — ZOLPIDEM TARTRATE 5 MG PO TABS
5.0000 mg | ORAL_TABLET | Freq: Every evening | ORAL | Status: DC | PRN
Start: 2014-12-02 — End: 2014-12-03

## 2014-12-02 MED ORDER — ALUM & MAG HYDROXIDE-SIMETH 200-200-20 MG/5ML PO SUSP: 30.0000 mL | Freq: Four times a day (QID) | ORAL | Status: DC | PRN

## 2014-12-02 MED ORDER — LIDOCAINE HCL (CARDIAC) 20 MG/ML IV SOLN
INTRAVENOUS | Status: DC | PRN
  Administered 2014-12-02: 50 mg via INTRAVENOUS

## 2014-12-02 MED ORDER — FENTANYL CITRATE (PF) 100 MCG/2ML IJ SOLN
INTRAMUSCULAR | Status: DC | PRN
  Administered 2014-12-02 (×2): 100 ug via INTRAVENOUS
  Administered 2014-12-02: 50 ug via INTRAVENOUS

## 2014-12-02 MED ORDER — FENTANYL CITRATE (PF) 250 MCG/5ML IJ SOLN
INTRAMUSCULAR | Status: AC
  Filled 2014-12-02: qty 5

## 2014-12-02 MED ORDER — DOCUSATE SODIUM 100 MG PO CAPS
100.0000 mg | ORAL_CAPSULE | Freq: Two times a day (BID) | ORAL | Status: DC
  Administered 2014-12-02 – 2014-12-04 (×4): 100 mg via ORAL
  Filled 2014-12-02 (×5): qty 1

## 2014-12-02 MED ORDER — ACETAMINOPHEN 650 MG RE SUPP: 650.0000 mg | Freq: Four times a day (QID) | RECTAL | Status: DC | PRN

## 2014-12-02 MED ORDER — SODIUM CHLORIDE 0.9 % IR SOLN
Status: DC | PRN
  Administered 2014-12-02 (×2): 1000 mL

## 2014-12-02 MED ORDER — VANCOMYCIN HCL IN DEXTROSE 1-5 GM/200ML-% IV SOLN
1000.0000 mg | Freq: Three times a day (TID) | INTRAVENOUS | Status: DC
  Administered 2014-12-02 – 2014-12-04 (×6): 1000 mg via INTRAVENOUS
  Filled 2014-12-02 (×8): qty 200

## 2014-12-02 MED ORDER — HYDROMORPHONE HCL 1 MG/ML IJ SOLN
INTRAMUSCULAR | Status: AC
  Administered 2014-12-02: 0.5 mg via INTRAVENOUS
  Filled 2014-12-02: qty 1

## 2014-12-02 MED ORDER — PROPOFOL 10 MG/ML IV BOLUS
INTRAVENOUS | Status: DC | PRN
  Administered 2014-12-02: 200 mg via INTRAVENOUS

## 2014-12-02 MED ORDER — LORAZEPAM 2 MG/ML IJ SOLN: 1.0000 mg | Freq: Four times a day (QID) | INTRAMUSCULAR | Status: DC | PRN

## 2014-12-02 MED ORDER — HYDROMORPHONE HCL 1 MG/ML IJ SOLN
INTRAMUSCULAR | Status: AC
  Administered 2014-12-02: 0.5 mg via INTRAVENOUS
  Filled 2014-12-02: qty 2

## 2014-12-02 MED ORDER — PIPERACILLIN-TAZOBACTAM 3.375 G IVPB
3.3750 g | Freq: Three times a day (TID) | INTRAVENOUS | Status: DC
  Administered 2014-12-02 – 2014-12-04 (×7): 3.375 g via INTRAVENOUS
  Filled 2014-12-02 (×8): qty 50

## 2014-12-02 MED ORDER — CHLORHEXIDINE GLUCONATE 4 % EX LIQD
60.0000 mL | Freq: Once | CUTANEOUS | Status: DC
  Filled 2014-12-02: qty 60

## 2014-12-02 SURGICAL SUPPLY — 54 items
BANDAGE ELASTIC 3 VELCRO ST LF (GAUZE/BANDAGES/DRESSINGS) ×3 IMPLANT
BANDAGE ELASTIC 4 VELCRO ST LF (GAUZE/BANDAGES/DRESSINGS) ×3 IMPLANT
BNDG COHESIVE 1X5 TAN STRL LF (GAUZE/BANDAGES/DRESSINGS) IMPLANT
BNDG CONFORM 2 STRL LF (GAUZE/BANDAGES/DRESSINGS) IMPLANT
BNDG ESMARK 4X9 LF (GAUZE/BANDAGES/DRESSINGS) ×3 IMPLANT
BNDG GAUZE ELAST 4 BULKY (GAUZE/BANDAGES/DRESSINGS) ×3 IMPLANT
CORDS BIPOLAR (ELECTRODE) ×3 IMPLANT
COVER SURGICAL LIGHT HANDLE (MISCELLANEOUS) ×3 IMPLANT
CUFF TOURNIQUET SINGLE 18IN (TOURNIQUET CUFF) ×3 IMPLANT
CUFF TOURNIQUET SINGLE 24IN (TOURNIQUET CUFF) IMPLANT
DRAIN PENROSE 1/4X12 LTX STRL (WOUND CARE) IMPLANT
DRAPE SURG 17X23 STRL (DRAPES) ×3 IMPLANT
DRSG ADAPTIC 3X8 NADH LF (GAUZE/BANDAGES/DRESSINGS) ×3 IMPLANT
DRSG EMULSION OIL 3X3 NADH (GAUZE/BANDAGES/DRESSINGS) ×3 IMPLANT
ELECT REM PT RETURN 9FT ADLT (ELECTROSURGICAL)
ELECTRODE REM PT RTRN 9FT ADLT (ELECTROSURGICAL) IMPLANT
GAUZE IODOFORM PACK 1/2 7832 (GAUZE/BANDAGES/DRESSINGS) ×3 IMPLANT
GAUZE SPONGE 4X4 12PLY STRL (GAUZE/BANDAGES/DRESSINGS) ×3 IMPLANT
GAUZE XEROFORM 1X8 LF (GAUZE/BANDAGES/DRESSINGS) ×3 IMPLANT
GAUZE XEROFORM 5X9 LF (GAUZE/BANDAGES/DRESSINGS) IMPLANT
GLOVE BIOGEL PI IND STRL 8.5 (GLOVE) ×1 IMPLANT
GLOVE BIOGEL PI INDICATOR 8.5 (GLOVE) ×2
GLOVE SURG ORTHO 8.0 STRL STRW (GLOVE) ×3 IMPLANT
GOWN STRL REUS W/ TWL LRG LVL3 (GOWN DISPOSABLE) ×3 IMPLANT
GOWN STRL REUS W/ TWL XL LVL3 (GOWN DISPOSABLE) ×1 IMPLANT
GOWN STRL REUS W/TWL LRG LVL3 (GOWN DISPOSABLE) ×6
GOWN STRL REUS W/TWL XL LVL3 (GOWN DISPOSABLE) ×2
HANDPIECE INTERPULSE COAX TIP (DISPOSABLE)
KIT BASIN OR (CUSTOM PROCEDURE TRAY) ×3 IMPLANT
KIT ROOM TURNOVER OR (KITS) ×3 IMPLANT
MANIFOLD NEPTUNE II (INSTRUMENTS) ×3 IMPLANT
NEEDLE HYPO 25GX1X1/2 BEV (NEEDLE) IMPLANT
NS IRRIG 1000ML POUR BTL (IV SOLUTION) ×3 IMPLANT
PACK ORTHO EXTREMITY (CUSTOM PROCEDURE TRAY) ×3 IMPLANT
PAD ARMBOARD 7.5X6 YLW CONV (MISCELLANEOUS) ×6 IMPLANT
PAD CAST 4YDX4 CTTN HI CHSV (CAST SUPPLIES) ×1 IMPLANT
PADDING CAST COTTON 4X4 STRL (CAST SUPPLIES) ×2
SET HNDPC FAN SPRY TIP SCT (DISPOSABLE) IMPLANT
SOAP 2 % CHG 4 OZ (WOUND CARE) ×3 IMPLANT
SPONGE LAP 18X18 X RAY DECT (DISPOSABLE) ×3 IMPLANT
SPONGE LAP 4X18 X RAY DECT (DISPOSABLE) ×3 IMPLANT
SUCTION FRAZIER TIP 10 FR DISP (SUCTIONS) ×3 IMPLANT
SUT ETHILON 4 0 PS 2 18 (SUTURE) IMPLANT
SUT ETHILON 5 0 P 3 18 (SUTURE)
SUT NYLON ETHILON 5-0 P-3 1X18 (SUTURE) IMPLANT
SYR CONTROL 10ML LL (SYRINGE) IMPLANT
TOWEL OR 17X24 6PK STRL BLUE (TOWEL DISPOSABLE) ×3 IMPLANT
TOWEL OR 17X26 10 PK STRL BLUE (TOWEL DISPOSABLE) ×3 IMPLANT
TUBE ANAEROBIC SPECIMEN COL (MISCELLANEOUS) IMPLANT
TUBE CONNECTING 12'X1/4 (SUCTIONS) ×1
TUBE CONNECTING 12X1/4 (SUCTIONS) ×2 IMPLANT
UNDERPAD 30X30 INCONTINENT (UNDERPADS AND DIAPERS) ×3 IMPLANT
WATER STERILE IRR 1000ML POUR (IV SOLUTION) ×3 IMPLANT
YANKAUER SUCT BULB TIP NO VENT (SUCTIONS) ×3 IMPLANT

## 2014-12-02 NOTE — Progress Notes (Addendum)
ANTIBIOTIC CONSULT NOTE - INITIAL  Pharmacy Consult for vancomycin Indication: cellulitis  No Known Allergies  Patient Measurements: Weight: 148 lb (67.132 kg)   Vital Signs: Temp: 97.9 F (36.6 C) (07/01 1045) Temp Source: Oral (07/01 1045) BP: 118/87 mmHg (07/01 1108) Pulse Rate: 98 (07/01 1108) Intake/Output from previous day:   Intake/Output from this shift:    Labs: No results for input(s): WBC, HGB, PLT, LABCREA, CREATININE in the last 72 hours. CrCl cannot be calculated (Unknown ideal weight.). No results for input(s): VANCOTROUGH, VANCOPEAK, VANCORANDOM, GENTTROUGH, GENTPEAK, GENTRANDOM, TOBRATROUGH, TOBRAPEAK, TOBRARND, AMIKACINPEAK, AMIKACINTROU, AMIKACIN in the last 72 hours.   Microbiology: No results found for this or any previous visit (from the past 720 hour(s)).  Medical History: Past Medical History  Diagnosis Date  . Hepatitis C infection   . Depression   . Anxiety   . Asthma     "as a child"  . Kidney stone     hx of  . Urinary tract infection     hx of  . Headache(784.0)     "occas"  . 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  [facility] and streaking along distal forearm.  Pt had chills this am. 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 AF.  No labs drawn yet.  Wt 148 lbs per pt.  In 2014 he was on vanc 1250 q8h with trough of 13.6 and dose was increased to 1750 q8h on 07/08/2012.    Goal of Therapy:  Vancomycin trough level 10-15 mcg/ml  Plan:  Vancomycin 1 gm IV q8h F/u renal fxn, wbc, temp, culture data Check SS trough as needed.    Herby AbrahamMichelle T. Bell, Pharm.D. [phone] 12/02/2014 11:52 AM  Addendum: Adding Zosyn per pharmacy as well, will give 3.375g IV q 8 hrs, extended-interval infusion.  Tad MooreJessica Joanell Cressler, Pharm D, BCPS  Clinical Pharmacist Pager [phone]  12/02/2014 2:38 PM

## 2014-12-02 NOTE — ED Notes (Signed)
Phlebotomy at bedside.

## 2014-12-02 NOTE — ED Notes (Signed)
Patient has an abscess to the right wrist that is red and swollen.  Patient had drainage yesterday.

## 2014-12-02 NOTE — Anesthesia Postprocedure Evaluation (Signed)
  Anesthesia Post-op Note  Patient: Christopher Berg  Procedure(s) Performed: Procedure(s): IRRIGATION AND DEBRIDEMENT HAND (Right)  Patient Location: PACU  Anesthesia Type:General  Level of Consciousness: awake and alert   Airway and Oxygen Therapy: Patient Spontanous Breathing  Post-op Pain: mild  Post-op Assessment: Post-op Vital signs reviewed              Post-op Vital Signs: stable  Last Vitals:  Filed Vitals:   12/02/14 2045  BP: 112/76  Pulse: 72  Temp:   Resp: 10    Complications: No apparent anesthesia complications 

## 2014-12-02 NOTE — ED Provider Notes (Signed)
CSN: [id number]     Arrival date & time 12/02/14  1044 History   First MD Initiated Contact with Patient 12/02/14 1047     Chief Complaint  Patient presents with  . Abscess  Abscess  (Consider location/radiation/quality/duration/timing/severity/associated sxs/prior Treatment) The history is provided by the patient. No language interpreter was used.  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 Patient states he felt chills this morning. Patient states he is a IV drug user and uses both meth and heroin. He denies any fever, chest pain, abdominal pain, nausea, vomiting.  Past Medical History  Diagnosis Date  . Hepatitis C infection   . Depression   . Anxiety   . Asthma     "as a child"  . Kidney stone     hx of  . Urinary tract infection     hx of  . Headache(784.0)     "occas"  . Chronic neck pain    Past Surgical History  Procedure Laterality Date  . No past surgeries    . 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 No pertinent family history. History  Substance Use Topics  . Smoking status: Current Every Day Smoker -- 1.00 packs/day    Types: Cigarettes  . Smokeless tobacco: Never Used  . Alcohol Use: Yes     Comment: socially    Review of Systems  Constitutional: Positive for chills. Negative for fever.  All other systems reviewed and are negative.     Allergies  Review of patient's allergies indicates no known allergies.  Home Medications   Prior to Admission medications   Not  on File   BP 125/85 mmHg  Pulse 104  Temp(Src) 97.9 F (36.6 C) (Oral)  Resp 17  Wt 148 lb (67.132 kg)  SpO2 100% Physical Exam  Constitutional: He is oriented to person, place, and time. He appears well-developed and well-nourished.  HENT:  Head: Normocephalic and atraumatic.  Eyes: Conjunctivae are normal.  Neck: Normal range of motion. Neck supple.  Cardiovascular: Normal rate, regular rhythm and normal heart sounds.   Pulmonary/Chest: Effort normal and breath sounds normal. No respiratory distress. He has no wheezes. He has no rales.  Abdominal: Soft. There is no tenderness.  Musculoskeletal: Normal range of motion.  Neurological: He is alert and oriented to person, place, and time.  Skin: Skin is warm and dry.  There is a distal right indurated forearm abscess along the ulnar side with a head. There is no active drainage or fluctuance. There is erythematous streaking up to the mid right forearm.  Psychiatric: He has a normal mood and affect. His behavior is normal.  Nursing note and vitals reviewed.   ED Course  Procedures (including critical care time) Labs Review Labs Reviewed  CBC WITH DIFFERENTIAL/PLATELET - Abnormal; Notable for the following:    WBC 10.6 (*)    Neutro Abs 8.1 (*)    All other components within normal limits  BASIC METABOLIC PANEL - Abnormal; Notable for the following:    Chloride 99 (*)  Glucose, Bld 147 (*)    All other components within normal limits  CULTURE, BLOOD (ROUTINE X 2)  CULTURE, BLOOD (ROUTINE X 2)    Imaging Review No results found.   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 He is currently afebrile and vitals are stable.  Patient is well appearing and not septic. His WBC is 10.6 and all other labs are remarkable. Patient was started on vancomycin. I discussed the plan with the patient.  I spoke to Ms. York,  PA-C for admission to med surg for cellulitis.      Christopher Umholtz Patel-MillCatha Gosselin16 [id number]  Donnetta Hutching, MD 12/02/14 [phone]

## 2014-12-02 NOTE — Anesthesia Preprocedure Evaluation (Addendum)
Anesthesia Evaluation  Patient identified by MRN, date of birth, ID band Patient awake  General Assessment Comment:IV drug user c cellulitis  Reviewed: Allergy & Precautions, NPO status , Patient's Chart, lab work & pertinent test results  Airway Mallampati: I  TM Distance: >3 FB Neck ROM: Full    Dental  (+) Teeth Intact, Poor Dentition   Pulmonary asthma , Current Smoker,  breath sounds clear to auscultation        Cardiovascular negative cardio ROS  Rhythm:Regular Rate:Normal     Neuro/Psych    GI/Hepatic (+) Hepatitis -  Endo/Other    Renal/GU      Musculoskeletal  (+) Arthritis -,   Abdominal   Peds  Hematology   Anesthesia Other Findings   Reproductive/Obstetrics                            Anesthesia Physical Anesthesia Plan  ASA: II and emergent  Anesthesia Plan: General   Post-op Pain Management:    Induction: Intravenous  Airway Management Planned: Oral ETT  Additional Equipment:   Intra-op Plan:   Post-operative Plan: Extubation in OR  Informed Consent: I have reviewed the patients History and Physical, chart, labs and discussed the procedure including the risks, benefits and alternatives for the proposed anesthesia with the patient or authorized representative who has indicated his/her understanding and acceptance.   Dental advisory given  Plan Discussed with: CRNA and Surgeon  Anesthesia Plan Comments:         Anesthesia Quick Evaluation

## 2014-12-02 NOTE — ED Notes (Signed)
Admitting at bedside with the patient.   

## 2014-12-02 NOTE — Anesthesia Postprocedure Evaluation (Signed)
  Anesthesia Post-op Note  Patient: Christopher Berg  Procedure(s) Performed: Procedure(s): IRRIGATION AND DEBRIDEMENT HAND (Right)  Patient Location: PACU  Anesthesia Type:General  Level of Consciousness: awake and alert   Airway and Oxygen Therapy: Patient Spontanous Breathing  Post-op Pain: mild  Post-op Assessment: Post-op Vital signs reviewed              Post-op Vital Signs: stable  Last Vitals:  Filed Vitals:   12/02/14 2045  BP: 112/76  Pulse: 72  Temp:   Resp: 10    Complications: No apparent anesthesia complications

## 2014-12-02 NOTE — H&P (Signed)
[name] History and Physical                                                                                    Christopher DowseJason Berg, is a 34 y.o. male  MRN: [id number]   DOB - 06/18/1980  Admit Date - 12/02/2014  Outpatient Primary MD for the patient is Terald SleeperOBSON,MICHAEL GAVIN, MD  Referring Physician:  Hebert SohoHanna Patel, ER PA  Chief Complaint:   Chief Complaint  Patient presents with  . Abscess     HPI  Christopher DowseJason Berg  is a 34 y.o. male, with current methamphetamine IV drug user with a history of epidural abscess (in 2014) who presents with cellulitis and abscess near his right wrist. The patient reports the area became red and swollen approximately 2 days ago. Yesterday in the shower he popped it open and drained yellow purulent material. Today it has become more red and swollen and painful. The patient admits to using methamphetamine via IV in his upper arms bilaterally. He states he last used 3 days ago. He denies withdrawal seizures or psychosis and states that normally when he doesn't have methamphetamine he becomes lightheaded and dizzy. He has rare alcohol use, and smokes about a pack a day. He had his fiance currently live in a hotel.  In the emergency department he is afebrile and his vital signs are normal. His white count is minimally elevated at 10.6. Blood cultures were obtained after vancomycin was given.  Review of Systems   In addition to the HPI above he complains of mild sore throat and nonproductive cough. No Fever-chills, No Headache, No changes with Vision or hearing, No problems swallowing food or Liquids, No Chest pain, Cough or Shortness of Breath, No Abdominal pain, No Nausea or Vomiting, Bowel movements are regular, No Blood in stool or Urine, No dysuria, No new skin rashes or bruises, No new joints pains-aches,  No new weakness, tingling, numbness in any extremity, No recent weight gain or loss, A full 10 point Review of Systems was done, except as stated  above, all other Review of Systems were negative.  Past Medical History  Past Medical History  Diagnosis Date  . Hepatitis C infection   . Depression   . Anxiety   . Asthma     "as a child"  . Kidney stone     hx of  . Urinary tract infection     hx of  . Headache(784.0)     "occas"  . Chronic neck pain     Past Surgical History  Procedure Laterality Date  . No past surgeries    . Anterior cervical decomp/discectomy fusion N/A 08/08/2012    Procedure: Incision, drainage and debridement of C5-6 prevertebral and epidural abscess with fusion and left iliac crest bone graft;  Surgeon: Kerrin ChampagneJames E [name], MD;  Location: [facility] OR;  Service: Orthopedics;  Laterality: N/A;      Social History History  Substance Use Topics  . Smoking status: Current Every Day Smoker -- 1.00 packs/day    Types: Cigarettes  . Smokeless tobacco: Never Used  . Alcohol Use: Yes     Comment: socially   positive for IV  drug use mostly methamphetamine. He no longer uses narcotic pain medications. He drinks one beer every 3 days. He smokes approximately one pack of cigarettes daily.  Family History No known family history of abscess.  Prior to Admission medications   Not on File    No Known Allergies  Physical Exam  Vitals  Blood pressure 113/78, pulse 88, temperature 97.9 F (36.6 C), temperature source Oral, resp. rate 17, weight 67.132 kg (148 lb), SpO2 99 %.   General: Thin, well-developed male, lying in bed in NAD, girlfriend at bedside.  Psych:  Normal affect and insight, Not Suicidal or Homicidal, Awake Alert, Oriented X 3.  Neuro:   No F.N deficits, ALL C.Nerves Intact, Strength 5/5 all 4 extremities, Sensation intact all 4 extremities.  ENT:  White coating on his tongue, red spots in his posterior oropharynx.  Neck:  Supple, No lymphadenopathy appreciated  Respiratory:  Symmetrical chest wall movement, Good air movement bilaterally, CTAB.  Cardiac:  RRR, No Murmurs, no LE edema noted,  no JVD.    Abdomen:  Positive bowel sounds, Soft, Non tender, Non distended,  No masses appreciated  Skin:  No Cyanosis, Normal Skin Turgor, No Skin Rash or Bruise.  Track marks noted on upper extremities bilaterally.  Extremities:  Able to move all 4. 5/5 strength in each,  no effusions.  Data Review  CBC  Recent Labs Lab 12/02/14 1151  WBC 10.6*  HGB 15.5  HCT 45.0  PLT 192  MCV 88.4  MCH 30.5  MCHC 34.4  RDW 12.3  LYMPHSABS 1.6  MONOABS 0.7  EOSABS 0.1  BASOSABS 0.0    Chemistries   Recent Labs Lab 12/02/14 1151  NA 135  K 3.7  CL 99*  CO2 27  GLUCOSE 147*  BUN 9  CREATININE 0.82  CALCIUM 9.3    Imaging results:   No results found.  EKG was not completed.   Assessment & Plan  Principal Problem:   Cellulitis of forearm, right Active Problems:   Polysubstance abuse   Nicotine abuse   Hepatitis C  Cellulitis with possible abscess near right wrist Will start IV Vanco and Zosyn. Hand surgery consult requested (Dr. Orlan Leavens). Will leave patient nothing by mouth for possible OR tonight. Order SCDs for DVT prophylaxis. Will not order pharmacologic DVT prophylaxis as he may go to the OR tonight.  IV drug use Patient denies withdrawal symptoms Will order CIWA protocol.  Patient refuses nicotine patch for tobacco addiction. Social work consult for IV drug abuse and homelessness.  Homelessness Currently living in hotel.  Will ask Social Work to offer resources.  Hyperglycemia Likely due to infection Check Hgb A1C   Consultants Called:  Dr. Orlan Leavens, hand surgeon  Family Communication:   Fiance at bedside  Code Status:  Full code  Condition:  Stable  Potential Disposition: Discharge in 48-72 hours  Time spent in minutes : 60   Algis Downs,  PA-C on 12/02/2014 at 2:25 PM Between 7am to 7pm - Pager - [phone] After 7pm go to [email or web address] - password [email or web address] And look for the night coverage person covering me after hours  [name]

## 2014-12-02 NOTE — Anesthesia Procedure Notes (Signed)
Procedure Name: Intubation Date/Time: 12/02/2014 7:31 PM Performed by: Nicholos JohnsMCPHAIL, Dian Laprade S Pre-anesthesia Checklist: Emergency Drugs available, Patient identified, Suction available, Patient being monitored and Timeout performed Patient Re-evaluated:Patient Re-evaluated prior to inductionOxygen Delivery Method: Circle system utilized Preoxygenation: Pre-oxygenation with 100% oxygen Intubation Type: IV induction Ventilation: Mask ventilation without difficulty Laryngoscope Size: Mac and 4 Grade View: Grade I Tube type: Oral Tube size: 7.5 mm Number of attempts: 1 Airway Equipment and Method: Stylet Placement Confirmation: ETT inserted through vocal cords under direct vision,  positive ETCO2 and breath sounds checked- equal and bilateral Secured at: 22 cm Tube secured with: Tape Dental Injury: Teeth and Oropharynx as per pre-operative assessment

## 2014-12-02 NOTE — Brief Op Note (Signed)
12/02/2014  8:05 PM  PATIENT:  Christopher NeedlesJason S Berg  34 y.o. male  PRE-OPERATIVE DIAGNOSIS:  CELLULITIS  POST-OPERATIVE DIAGNOSIS:  Cellulitis  PROCEDURE:  Procedure(s): IRRIGATION AND DEBRIDEMENT HAND (Right)  SURGEON:  Surgeon(s) and Role:    * Bradly BienenstockFred Ying Rocks, MD - Primary  PHYSICIAN ASSISTANT: none  ASSISTANTS: none   ANESTHESIA:   general  EBL:  Total I/O In: 500 [I.V.:500] Out: -   BLOOD ADMINISTERED:none  DRAINS: none   LOCAL MEDICATIONS USED:  NONE  SPECIMEN:  No Specimen  DISPOSITION OF SPECIMEN:  N/A  COUNTS:  YES  TOURNIQUET:   Total Tourniquet Time Documented: Upper Arm (Right) - 4 minutes Total: Upper Arm (Right) - 4 minutes   DICTATION:[id number]  PLAN OF CARE: Admit to inpatient   PATIENT DISPOSITION:  PACU - hemodynamically stable.   Delay start of Pharmacological VTE agent (>24hrs) due to surgical blood loss or risk of bleeding: not applicable

## 2014-12-02 NOTE — Transfer of Care (Signed)
Immediate Anesthesia Transfer of Care Note  Patient: Christopher Berg  Procedure(s) Performed: Procedure(s): IRRIGATION AND DEBRIDEMENT HAND (Right)  Patient Location: PACU  Anesthesia Type:General  Level of Consciousness: awake  Airway & Oxygen Therapy: Patient Spontanous Breathing and Patient connected to nasal cannula oxygen  Post-op Assessment: Report given to RN and Post -op Vital signs reviewed and stable  Post vital signs: Reviewed and stable  Last Vitals:  Filed Vitals:   12/02/14 2003  BP:   Pulse:   Temp: 36.9 C  Resp:     Complications: No apparent anesthesia complications

## 2014-12-02 NOTE — Progress Notes (Signed)
Report called to OR holding at 1835.

## 2014-12-02 NOTE — Consult Note (Signed)
Reason for Consult:left forearm abscess Referring Physician: Hospitalist service dr Junita Push Christopher Berg is an 34 y.o. male.  HPI: Christopher Berg is a 34 y.o. male with IV drug use who presents with cellulitis and abscess of the forearm near his right wrist patient admitted to using IV methamphetamine, 3 days ago. Patient presented with red and swollen area about 3 cm, yesterday he reported that he popped it and yellow purulent material came out, today became more red and swollen and painful. Afebrile, vital signs are normal, white count is minimally elevated at 10.6.  Past Medical History  Diagnosis Date  . Hepatitis C infection   . Depression   . Anxiety   . Asthma     "as a child"  . Kidney stone     hx of  . Urinary tract infection     hx of  . Headache(784.0)     "occas"  . Chronic neck pain     Past Surgical History  Procedure Laterality Date  . No past surgeries    . Anterior cervical decomp/discectomy fusion N/A 08/08/2012    Procedure: Incision, drainage and debridement of C5-6 prevertebral and epidural abscess with fusion and left iliac crest bone graft;  Surgeon: Jessy Oto, MD;  Location: [facility];  Service: Orthopedics;  Laterality: N/A;    History reviewed. No pertinent family history.  Social History:  reports that he has been smoking Cigarettes.  He has been smoking about 1.00 pack per day. He has never used smokeless tobacco. He reports that he drinks alcohol. He reports that he uses illicit drugs (Marijuana and IV).  Allergies: No Known Allergies  Medications: I have reviewed the patient's current medications.  Results for orders placed or performed during the hospital encounter of 12/02/14 (from the past 48 hour(s))  CBC with Differential     Status: Abnormal   Collection Time: 12/02/14 11:51 AM  Result Value Ref Range   WBC 10.6 (H) 4.0 - 10.5 K/uL   RBC 5.09 4.22 - 5.81 MIL/uL   Hemoglobin 15.5 13.0 - 17.0 g/dL   HCT 45.0 39.0 - 52.0 %   MCV 88.4 78.0  - 100.0 fL   MCH 30.5 26.0 - 34.0 pg   MCHC 34.4 30.0 - 36.0 g/dL   RDW 12.3 11.5 - 15.5 %   Platelets 192 150 - 400 K/uL   Neutrophils Relative % 76 43 - 77 %   Neutro Abs 8.1 (H) 1.7 - 7.7 K/uL   Lymphocytes Relative 16 12 - 46 %   Lymphs Abs 1.6 0.7 - 4.0 K/uL   Monocytes Relative 7 3 - 12 %   Monocytes Absolute 0.7 0.1 - 1.0 K/uL   Eosinophils Relative 1 0 - 5 %   Eosinophils Absolute 0.1 0.0 - 0.7 K/uL   Basophils Relative 0 0 - 1 %   Basophils Absolute 0.0 0.0 - 0.1 K/uL  Basic metabolic panel     Status: Abnormal   Collection Time: 12/02/14 11:51 AM  Result Value Ref Range   Sodium 135 135 - 145 mmol/L   Potassium 3.7 3.5 - 5.1 mmol/L   Chloride 99 (L) 101 - 111 mmol/L   CO2 27 22 - 32 mmol/L   Glucose, Bld 147 (H) 65 - 99 mg/dL   BUN 9 6 - 20 mg/dL   Creatinine, Ser 0.82 0.61 - 1.24 mg/dL   Calcium 9.3 8.9 - 10.3 mg/dL   GFR calc non Af Amer >60 >60 mL/min  GFR calc Af Amer >60 >60 mL/min    Comment: (NOTE) The eGFR has been calculated using the CKD EPI equation. This calculation has not been validated in all clinical situations. eGFR's persistently <60 mL/min signify possible Chronic Kidney Disease.    Anion gap 9 5 - 15    No results found.  ROS NO RECENT ILLNESSES OR HOSPITALIZATIONS Blood pressure 117/85, pulse 78, temperature 97.6 F (36.4 C), temperature source Oral, resp. rate 17, weight 67.132 kg (148 lb), SpO2 100 %. Physical Exam  General Appearance:  Alert, cooperative, no distress, appears stated age  Head:  Normocephalic, without obvious abnormality, atraumatic  Eyes:  Pupils equal, conjunctiva/corneas clear,         Throat: Lips, mucosa, and tongue normal; teeth and gums normal  Neck: No visible masses     Lungs:   respirations unlabored  Chest Wall:  No tenderness or deformity  Heart:  Regular rate and rhythm,  Abdomen:   Soft, non-tender,         Extremities: RIGHT FOREARM LARGE FLUCTUANT AREA OVER VOLAR DISTAL FOREARM TTP OVER  REGION MILD RED STREAKING PROXIMALLY FINGERS WARM WELL PERFUSED ABLE TO FLEX THUMB AND EXTEND THUMB  Pulses: 2+ and symmetric  Skin: Skin color, texture, turgor normal, no rashes or lesions     Neurologic: Normal    Assessment/Plan: RIGHT FOREARM ABSCESS IV DRUG ABUSE HEPATITIS  RIGHT FOREARM INCISION AND DRAINAGE  TO OR TONIGHT DISCUSSED WITH PATIENT  WE ARE PLANNING SURGERY FOR YOUR UPPER EXTREMITY. THE RISKS AND BENEFITS OF SURGERY INCLUDE BUT NOT LIMITED TO BLEEDING INFECTION, DAMAGE TO NEARBY NERVES ARTERIES TENDONS, FAILURE OF SURGERY TO ACCOMPLISH ITS INTENDED GOALS, PERSISTENT SYMPTOMS AND NEED FOR FURTHER SURGICAL INTERVENTION. WITH THIS IN MIND WE WILL PROCEED. I HAVE DISCUSSED WITH THE PATIENT THE PRE AND POSTOPERATIVE REGIMEN AND THE DOS AND DON'TS. PT VOICED UNDERSTANDING AND INFORMED CONSENT SIGNED.  Linna Hoff 12/02/2014, 5:42 PM

## 2014-12-03 DIAGNOSIS — L02413 Cutaneous abscess of right upper limb: Secondary | ICD-10-CM

## 2014-12-03 DIAGNOSIS — F1910 Other psychoactive substance abuse, uncomplicated: Secondary | ICD-10-CM

## 2014-12-03 LAB — BASIC METABOLIC PANEL
Anion gap: 6 (ref 5–15)
BUN: 8 mg/dL (ref 6–20)
CO2: 28 mmol/L (ref 22–32)
Calcium: 8.5 mg/dL — ABNORMAL LOW (ref 8.9–10.3)
Chloride: 106 mmol/L (ref 101–111)
Creatinine, Ser: 0.82 mg/dL (ref 0.61–1.24)
GFR calc Af Amer: >60 mL/min (ref >60)
GFR calc non Af Amer: >60 mL/min (ref >60)
GLUCOSE: 119 mg/dL — AB (ref 65–99)
POTASSIUM: 3.7 mmol/L (ref 3.5–5.1)
Sodium: 140 mmol/L (ref 135–145)

## 2014-12-03 LAB — HEMOGLOBIN A1C
Hgb A1c MFr Bld: 5.6 % (ref 4.8–5.6)
Mean Plasma Glucose: 114 mg/dL

## 2014-12-03 LAB — CBC
HCT: 40.4 % (ref 39.0–52.0)
Hemoglobin: 13.7 g/dL (ref 13.0–17.0)
MCH: 30.2 pg (ref 26.0–34.0)
MCHC: 33.9 g/dL (ref 30.0–36.0)
MCV: 89.2 fL (ref 78.0–100.0)
Platelets: 171 10*3/uL (ref 150–400)
RBC: 4.53 MIL/uL (ref 4.22–5.81)
RDW: 12.5 % (ref 11.5–15.5)
WBC: 7.4 10*3/uL (ref 4.0–10.5)

## 2014-12-03 LAB — SURGICAL PCR SCREEN
MRSA, PCR: POSITIVE — AB
Staphylococcus aureus: POSITIVE — AB

## 2014-12-03 MED ORDER — MUPIROCIN 2 % EX OINT
1.0000 "application " | TOPICAL_OINTMENT | Freq: Two times a day (BID) | CUTANEOUS | Status: DC
  Administered 2014-12-03 – 2014-12-04 (×3): 1 via NASAL

## 2014-12-03 MED ORDER — CHLORHEXIDINE GLUCONATE CLOTH 2 % EX PADS
6.0000 | MEDICATED_PAD | Freq: Every day | CUTANEOUS | Status: DC
  Administered 2014-12-04: 6 via TOPICAL

## 2014-12-03 NOTE — Op Note (Signed)
NAMEMarland Kitchen  ALDO, SONDGEROTH NO.:  [id number]  MEDICAL RECORD NO.:  [id number]  LOCATION:  6N31C                        FACILITY:  [facility]  PHYSICIAN:  Sharma Covert IV, M.D.DATE OF BIRTH:  May 30, 1981  DATE OF PROCEDURE:  12/02/2014 DATE OF DISCHARGE:                              OPERATIVE REPORT   PREOPERATIVE DIAGNOSIS:  Right forearm deep abscess.  POSTOPERATIVE DIAGNOSIS:  Right forearm deep abscess.  ATTENDING PHYSICIAN:  Sharma Covert, M.D., who scrubbed and present for the entire procedure.  ASSISTANT SURGEON:  None.  ANESTHESIA:  General via LMA endotracheal tube.  SURGICAL PROCEDURES: 1. Right forearm incision and drainage of deep abscess subfascial. 2. Right forearm flexor carpi ulnaris tendon sheath incision and     release.  SURGICAL INDICATIONS:  Christopher Berg is an IV drug user and does multiple illicit substances, who presented with worsening pain and swelling around his right wrist.  The patient was seen and evaluated by Internal Medicine Service, admitted, and I was consulted for further management of his right forearm abscess.  Based on examination, it was recommended that he undergo the above procedure.  Risks, benefits, and alternatives were discussed in detail with the patient and signed informed consent was obtained.  Risks include, but not limited to bleeding, infection, damage to nearby nerves, arteries, or tendons; loss of motion in wrist and digits, incomplete relief of symptoms, and need for further surgical intervention.  DESCRIPTION OF PROCEDURE:  The patient was properly identified in the preoperative holding area and marked with a permanent marker made on the right forearm to indicate the correct operative site.  The patient was then brought back to the operating room, placed supine on anesthesia room table.  General anesthesia was administered.  The patient had been receiving preoperative antibiotics.  A well-padded tourniquet  was placed on the right forearm brachium and sealed with 1000 drape.  Right upper extremity was then prepped and draped in normal sterile fashion.  Time- out was called, correct side was identified, and procedure was then begun.  Attention was then turned to the right forearm and longitudinal incision was made directly over the abscess area.  Deep dissection was carried down through the skin and subcutaneous tissue where the abscess was identified.  On evacuation of the abscess, the abscess cavity was then removed which was necrotic tissue with a rongeur.  The FCU sheath was then opened proximally and distally.  FCU sheath was then washed out.  After drainage of the abscess and release of the FCU sheath, the wound was then irrigated and then the wound was packed with a sterile packing gauze and loosely reapproximated with 3-0 Prolene sutures. Adaptic dressing and sterile compressive bandage was then applied.  The patient tolerated the procedure well and returned to recovery room in good condition.  POSTPROCEDURE PLAN:  The patient admitted back to the Internal Medicine Service.  We will change the wound in 48 hours and takeout the packing and then began outpatient wound care.  Continue with IV antibiotics until cultures come back.  Intraoperative cultures, aerobic, and anaerobic cultures were sent.     Madelynn Done, M.D.     FWO/MEDQ  D:  12/02/2014  T:  12/03/2014  Job:  [id number]

## 2014-12-03 NOTE — Progress Notes (Signed)
PROGRESS NOTE    Christopher NeedlesJason S Oakley ZOX:[id number] DOB: 09/27/1980 DOA: 12/02/2014 PCP: Terald SleeperOBSON,MICHAEL GAVIN, MD  HPI/Brief narrative 34 year old male with history of IVDA, hep C, anxiety, depression, polysubstance abuse, epidural space abscess [facility] with cellulitis and abscess of forearm near right wrist. History of IV methamphetamine use 3 days ago. Status post I&D by hand surgeon 7/1.  Assessment/Plan:  Abscess and cellulitis of right forearm - Status post I&D by hand surgeon on 7/1 - Continue IV vancomycin and Zosyn pending culture results. - As per surgeon, wound dressing will be changed in 48 hours - Improving  Polysubstance abuse-IV amphetamines, THC, tobacco & alcohol - Cessation counseled  Hepatitis C  Homelessness - Social worker assistance at discharge    DVT prophylaxis: SCD Code Status: Full  Family Communication: None at bedside  Disposition Plan: DC home in 3-4 days  Consultants:  Hydrographic surveyorHand surgeon   Procedures:  I&D of right forearm abscess 7/1   Antibiotics:  IV vancomycin 7/1 >  IV Zosyn 7/1 >   Subjective: Improved pain of right upper extremity   Objective: Filed Vitals:   12/02/14 2141 12/03/14 0129 12/03/14 0610 12/03/14 0948  BP: 102/57 111/62 114/73 118/68  Pulse: 72 83 88 82  Temp: 98.6 F (37 C) 98.6 F (37 C) 99.3 F (37.4 C) 98.8 F (37.1 C)  TempSrc: Oral Oral Oral Oral  Resp: 16 16 16 16   Weight:      SpO2: 99% 100% 98% 99%    Intake/Output Summary (Last 24 hours) at 12/03/14 1419 Last data filed at 12/03/14 1400  Gross per 24 hour  Intake 3527.58 ml  Output   2405 ml  Net 1122.58 ml   Filed Weights   12/02/14 1108  Weight: 67.132 kg (148 lb)     Exam:  General exam: Pleasant young male ambulating comfortably in the room.  Respiratory system: Clear. No increased work of breathing. Cardiovascular system: S1 & S2 heard, RRR. No JVD, murmurs, gallops, clicks or pedal edema. Gastrointestinal  system: Abdomen is nondistended, soft and nontender. Normal bowel sounds heard. Central nervous system: Alert and oriented. No focal neurological deficits. Extremities: Symmetric 5 x 5 power.Right forearm dressing clean and dry. Able to move fingers well.   Data Reviewed: Basic Metabolic Panel:  Recent Labs Lab 12/02/14 1151 12/03/14 0349  NA 135 140  K 3.7 3.7  CL 99* 106  CO2 27 28  GLUCOSE 147* 119*  BUN 9 8  CREATININE 0.82 0.82  CALCIUM 9.3 8.5*   Liver Function Tests: No results for input(s): AST, ALT, ALKPHOS, BILITOT, PROT, ALBUMIN in the last 168 hours. No results for input(s): LIPASE, AMYLASE in the last 168 hours. No results for input(s): AMMONIA in the last 168 hours. CBC:  Recent Labs Lab 12/02/14 1151 12/03/14 0349  WBC 10.6* 7.4  NEUTROABS 8.1*  --   HGB 15.5 13.7  HCT 45.0 40.4  MCV 88.4 89.2  PLT 192 171   Cardiac Enzymes: No results for input(s): CKTOTAL, CKMB, CKMBINDEX, TROPONINI in the last 168 hours. BNP (last 3 results) No results for input(s): PROBNP in the last 8760 hours. CBG: No results for input(s): GLUCAP in the last 168 hours.  Recent Results (from the past 240 hour(s))  Culture, blood (routine x 2)     Status: None (Preliminary result)   Collection Time: 12/02/14  1:15 PM  Result Value Ref Range Status   Specimen Description BLOOD RIGHT ANTECUBITAL  Final   Special Requests  BOTTLES DRAWN AEROBIC AND ANAEROBIC  Final   Culture NO GROWTH < 24 HOURS  Final   Report Status PENDING  Incomplete  Culture, blood (routine x 2)     Status: None (Preliminary result)   Collection Time: 12/02/14  1:25 PM  Result Value Ref Range Status   Specimen Description BLOOD LEFT ANTECUBITAL  Final   Special Requests BOTTLES DRAWN AEROBIC AND ANAEROBIC  Final   Culture NO GROWTH < 24 HOURS  Final   Report Status PENDING  Incomplete  Surgical pcr screen     Status: Abnormal   Collection Time: 12/02/14  6:08 PM  Result Value Ref Range Status    MRSA, PCR POSITIVE (A) NEGATIVE Final    Comment: RESULT CALLED TO, READ BACK BY AND VERIFIED WITH: C.SHELTON RN 1610 12/03/14 E.GADDY    Staphylococcus aureus POSITIVE (A) NEGATIVE Final    Comment:        The Xpert SA Assay (FDA approved for NASAL specimens in patients over 37 years of age), is one component of a comprehensive surveillance program.  Test performance has been validated by [facility] for patients greater than or equal to 39 year old. It is not intended to diagnose infection nor to guide or monitor treatment.   Anaerobic culture     Status: None (Preliminary result)   Collection Time: 12/02/14  7:44 PM  Result Value Ref Range Status   Specimen Description ABSCESS RIGHT ARM  Final   Special Requests NONE  Final   Gram Stain   Final    MODERATE WBC PRESENT,BOTH PMN AND MONONUCLEAR NO SQUAMOUS EPITHELIAL CELLS SEEN FEW GRAM POSITIVE COCCI IN PAIRS IN CLUSTERS Performed at [facility]    Culture   Final    NO ANAEROBES ISOLATED; CULTURE IN PROGRESS FOR 5 DAYS Performed at [facility]    Report Status PENDING  Incomplete  Culture, routine-abscess     Status: None (Preliminary result)   Collection Time: 12/02/14  7:44 PM  Result Value Ref Range Status   Specimen Description ABSCESS RIGHT ARM  Final   Special Requests NONE  Final   Gram Stain   Final    MODERATE WBC PRESENT,BOTH PMN AND MONONUCLEAR NO SQUAMOUS EPITHELIAL CELLS SEEN FEW GRAM POSITIVE COCCI IN PAIRS IN CLUSTERS Performed at [facility]    Culture   Final    Culture reincubated for better growth Performed at [facility]    Report Status PENDING  Incomplete        Studies: No results found.      Scheduled Meds: . Chlorhexidine Gluconate Cloth  6 each Topical Q0600  . docusate sodium  100 mg Oral BID  . mupirocin ointment  1 application Nasal BID  . piperacillin-tazobactam (ZOSYN)  IV  3.375 g Intravenous 3 times per day  . vancomycin   1,000 mg Intravenous Q8H   Continuous Infusions: . sodium chloride 125 mL/hr at 12/03/14 1000    Principal Problem:   Cellulitis of forearm, right Active Problems:   Polysubstance abuse   Nicotine abuse   Hepatitis C   Cellulitis and abscess of hand   Abscess of left forearm    Time spent: 25 minutes.    Marcellus Scott, MD, FACP, FHM. [name] Pager [phone]  If 7PM-7AM, please contact night-coverage [email or web address] Password [email or web address] 12/03/2014, 2:19 PM    LOS: 1 day

## 2014-12-04 ENCOUNTER — Encounter (HOSPITAL_COMMUNITY): Payer: Self-pay | Admitting: Adult Health

## 2014-12-04 ENCOUNTER — Emergency Department (HOSPITAL_COMMUNITY)
Admission: EM | Admit: 2014-12-04 | Discharge: 2014-12-04 | Disposition: A | Discharge status: Home / Self Care | Payer: Self-pay | Attending: Emergency Medicine | Admitting: Emergency Medicine

## 2014-12-04 DIAGNOSIS — Z720 Tobacco use: Secondary | ICD-10-CM | POA: Insufficient documentation

## 2014-12-04 DIAGNOSIS — F1990 Other psychoactive substance use, unspecified, uncomplicated: Secondary | ICD-10-CM

## 2014-12-04 DIAGNOSIS — Z8619 Personal history of other infectious and parasitic diseases: Secondary | ICD-10-CM | POA: Insufficient documentation

## 2014-12-04 DIAGNOSIS — Z8659 Personal history of other mental and behavioral disorders: Secondary | ICD-10-CM | POA: Insufficient documentation

## 2014-12-04 DIAGNOSIS — G8929 Other chronic pain: Secondary | ICD-10-CM | POA: Insufficient documentation

## 2014-12-04 DIAGNOSIS — L02413 Cutaneous abscess of right upper limb: Secondary | ICD-10-CM | POA: Insufficient documentation

## 2014-12-04 DIAGNOSIS — Z87440 Personal history of urinary (tract) infections: Secondary | ICD-10-CM | POA: Insufficient documentation

## 2014-12-04 DIAGNOSIS — Z87442 Personal history of urinary calculi: Secondary | ICD-10-CM | POA: Insufficient documentation

## 2014-12-04 DIAGNOSIS — J45909 Unspecified asthma, uncomplicated: Secondary | ICD-10-CM | POA: Insufficient documentation

## 2014-12-04 MED ORDER — CLINDAMYCIN HCL 300 MG PO CAPS
300.0000 mg | ORAL_CAPSULE | Freq: Once | ORAL | Status: AC
  Administered 2014-12-04: 300 mg via ORAL
  Filled 2014-12-04: qty 1
  Filled 2014-12-04: qty 2

## 2014-12-04 MED ORDER — OXYCODONE HCL 5 MG PO TABS: 5.0000 mg | ORAL_TABLET | Freq: Once | ORAL | Status: AC

## 2014-12-04 MED ORDER — OXYCODONE HCL 5 MG PO TABS
5.0000 mg | ORAL_TABLET | Freq: Once | ORAL | Status: AC
  Administered 2014-12-04: 5 mg via ORAL
  Filled 2014-12-04: qty 1

## 2014-12-04 MED ORDER — CLINDAMYCIN HCL 300 MG PO CAPS: 300.0000 mg | ORAL_CAPSULE | Freq: Three times a day (TID) | ORAL | Status: AC

## 2014-12-04 NOTE — Discharge Instructions (Signed)
Return if you develop worsening pain, increased redness, increased drainage from your right arm wound, or fever over 101.  Please keep the area clean and dry and change your bandages at least three times a day or when wet.    Abscess An abscess is an infected area that contains a collection of pus and debris.It can occur in almost any part of the body. An abscess is also known as a furuncle or boil. CAUSES  An abscess occurs when tissue gets infected. This can occur from blockage of oil or sweat glands, infection of hair follicles, or a minor injury to the skin. As the body tries to fight the infection, pus collects in the area and creates pressure under the skin. This pressure causes pain. People with weakened immune systems have difficulty fighting infections and get certain abscesses more often.  SYMPTOMS Usually an abscess develops on the skin and becomes a painful mass that is red, warm, and tender. If the abscess forms under the skin, you may feel a moveable soft area under the skin. Some abscesses break open (rupture) on their own, but most will continue to get worse without care. The infection can spread deeper into the body and eventually into the bloodstream, causing you to feel ill.  DIAGNOSIS  Your caregiver will take your medical history and perform a physical exam. A sample of fluid may also be taken from the abscess to determine what is causing your infection. TREATMENT  Your caregiver may prescribe antibiotic medicines to fight the infection. However, taking antibiotics alone usually does not cure an abscess. Your caregiver may need to make a small cut (incision) in the abscess to drain the pus. In some cases, gauze is packed into the abscess to reduce pain and to continue draining the area. HOME CARE INSTRUCTIONS   Only take over-the-counter or prescription medicines for pain, discomfort, or fever as directed by your caregiver.  If you were prescribed antibiotics, take them as  directed. Finish them even if you start to feel better.  If gauze is used, follow your caregiver's directions for changing the gauze.  To avoid spreading the infection:  Keep your draining abscess covered with a bandage.  Wash your hands well.  Do not share personal care items, towels, or whirlpools with others.  Avoid skin contact with others.  Keep your skin and clothes clean around the abscess.  Keep all follow-up appointments as directed by your caregiver. SEEK MEDICAL CARE IF:   You have increased pain, swelling, redness, fluid drainage, or bleeding.  You have muscle aches, chills, or a general ill feeling.  You have a fever. MAKE SURE YOU:   Understand these instructions.  Will watch your condition.  Will get help right away if you are not doing well or get worse. Document Released: 02/27/2005 Document Revised: 11/19/2011 Document Reviewed: 08/02/2011 [facility] Patient Information 2015 [place], [place]. This information is not intended to replace advice given to you by your health care provider. Make sure you discuss any questions you have with your health care provider.

## 2014-12-04 NOTE — ED Provider Notes (Signed)
CSN: [id number]     Arrival date & time 12/04/14  1713 History   First MD Initiated Contact with Patient 12/04/14 1748     Chief Complaint  Patient presents with  . Abscess   (Consider location/radiation/quality/duration/timing/severity/associated sxs/prior Treatment) Patient is a 34 y.o. male presenting with abscess. The history is provided by the patient and medical records.  Abscess Location:  Shoulder/arm Shoulder/arm abscess location:  R forearm Size:  2 cm laceration from I/D Abscess quality: draining (scant) and painful   Abscess quality: no fluctuance, no induration and no redness   Red streaking: no   Progression:  Partially resolved Pain details:    Quality:  Aching   Severity:  Moderate   Timing:  Constant   Progression:  Improving Chronicity:  New Context: injected drug use   Relieved by:  Nothing Worsened by:  Nothing tried Ineffective treatments:  None tried Associated symptoms: no anorexia, no fatigue, no fever, no headaches, no nausea and no vomiting   Risk factors: prior abscess     Past Medical History  Diagnosis Date  . Hepatitis C infection   . Depression   . Anxiety   . Asthma     "as a child"  . Kidney stone     hx of  . Urinary tract infection     hx of  . Headache(784.0)     "occas"  . Chronic neck pain    Past Surgical History  Procedure Laterality Date  . No past surgeries    . 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 No pertinent family history. History  Substance Use Topics  . Smoking status: Current Every Day Smoker -- 1.00 packs/day    Types: Cigarettes  . Smokeless tobacco: Never Used  . Alcohol Use: Yes     Comment: socially    Review of Systems  Constitutional:  Negative for fever and fatigue.  Respiratory: Negative for chest tightness and shortness of breath.   Cardiovascular: Negative for chest pain.  Gastrointestinal: Negative for nausea, vomiting, abdominal pain and anorexia.  Skin: Positive for wound (right forearm I/D'd abscess).  Neurological: Negative for light-headedness and headaches.  Psychiatric/Behavioral: Negative for confusion.  All other systems reviewed and are negative.     Allergies  Review of patient's allergies indicates no known allergies.  Home Medications   Prior to Admission medications   Medication Sig Start Date End Date Taking? Authorizing Provider  clindamycin (CLEOCIN) 300 MG capsule Take 1 capsule (300 mg total) by mouth 3 (three) times daily. 12/04/14 12/14/14  Lenell AntuJamie Bernardino Dowell, MD  oxyCODONE (OXY IR/ROXICODONE) 5 MG immediate release tablet Take 1 tablet (5 mg total) by mouth once. 12/04/14   Lenell AntuJamie Sarahelizabeth Conway, MD   BP 119/81 mmHg  Pulse 102  Temp(Src) 97.8 F (36.6 C) (Oral)  Resp 16  SpO2 99% Physical Exam  Constitutional: He is oriented to person, place, and time. He appears well-developed and well-nourished. No distress.  HENT:  Head: Normocephalic and atraumatic.  Nose: Nose normal.  Mouth/Throat: Oropharynx is clear and moist. No oropharyngeal exudate.  Eyes: EOM are normal. Pupils are equal, round, and reactive to light.  Neck: Normal range of motion. Neck supple.  Cardiovascular: Normal rate, regular rhythm, normal heart sounds and intact distal pulses.  No murmur heard. Pulmonary/Chest: Effort normal and breath sounds normal. No respiratory distress. He has no wheezes. He exhibits no tenderness.  Abdominal: Soft. He exhibits no distension. There is no tenderness. There is no guarding.  Musculoskeletal: Normal range of motion. He exhibits tenderness (R forearm).       Arms: Neurological: He is alert and oriented to person, place, and time. No cranial nerve deficit. Coordination normal.  Skin: Skin is  warm and dry. He is not diaphoretic. No pallor.  Psychiatric: He has a normal mood and affect. His behavior is normal. Judgment and thought content normal.  Nursing note and vitals reviewed.   ED Course  Procedures (including critical care time) Labs Review Labs Reviewed - No data to display  Imaging Review No results found.   EKG Interpretation None      MDM   Final diagnoses:  Abscess of right forearm  IV drug user   Pt is a 34 yo M with hx of IVDU who presents complaining of right arm pain.  Had a right forearm abscess I/D'd yesterday in the ED, then admitted for cellulitis 2/2 red forearm streaking.  Treated with IV vanc and zosyn.  Cultures obtained that are growing staph aureus.  Pt reports that he left early this morning AMA "bc I needed to smoke a cigarette and they wouldn't let me".  Was not given any medication Rxs on dc.  Returns today due to prompting from his family, requesting Abx and pain control.  Denies fevers, myalgias, or chills.  Has pain at his right forearm directly around the wound but otherwise is in NAD.   Wound looks good.  No forearm red streaking.  Minimal erythema directly around the incision.  Well approximated wound edges.  Scant discharge on his dressing.    Will give one dose of roxicodone and clindamycin in the ED.   Discharged with Rx for clindamycin and 5 tablets of roxicodone 5 mg.  Advised to f/u with PCP in 1 week for a wound check and for suture removal.  Discussed wound care and ED return precautions.  Discharged in good condition.  Advised to avoid IV drug use and encouraged needle safety.     If performed, labs, EKGs, and imaging were reviewed and interpreted by myself and my attending, and incorporated in the medical decision making.  Patient was seen with ED Attending, Dr. Sandi Mealy, MD    Lenell Antu, MD 12/05/14 Josefine Class  Nelva Nay, MD 12/18/14 [phone]

## 2014-12-04 NOTE — Progress Notes (Signed)
PT SEEN/EXAMINED WOUND CHANGED AND PACKING REMOVED LOOKS MUCH BETTER REDRESSED TODAY PT TOLERATED KEEP DRESSING ON AT ALL TIMES WILL NEED TO SEE MY IN OFFICE IN 8 DAYS DO NOT REMOVE BANDAGE ORAL ABX AS PER SENSITIVITIES LIKELY MRSA

## 2014-12-04 NOTE — Progress Notes (Signed)
PROGRESS NOTE    Christopher Berg:[id number] DOB: [birth year] DOA: 12/02/2014 PCP: Terald Sleeper, MD  HPI/Brief narrative 34 year old male with history of IVDA, hep C, anxiety, depression, polysubstance abuse, epidural space abscess admitted to [facility] on 12/02/14 with cellulitis and abscess of forearm near right wrist. History of IV methamphetamine use 3 days ago. Status post I&D by hand surgeon 7/1.  Assessment/Plan:  Staphylococcal (possible MRSA) Abscess and cellulitis of right forearm - Status post I&D by hand surgeon on 7/1 - Continue IV vancomycin and Zosyn pending culture results. - As per surgeon, wound dressing will be changed in 48 hours- requested nursing to contact surgeon regarding dressing change instructions & outpatient follow-up. - Improving - Wound culture 1 shows abundant staph aureus-sensitivities pending. Blood cultures 2: Negative to date - Consider switching to oral Bactrim pending final culture results.  Polysubstance abuse-IV amphetamines, THC, tobacco & alcohol - Cessation counseled  Hepatitis C  Homelessness - Social worker assistance at discharge    DVT prophylaxis: SCD Code Status: Full  Family Communication: None at bedside  Disposition Plan: DC home in 1-2 days  Consultants:  Hydrographic surveyor   Procedures:  I&D of right forearm abscess 7/1   Antibiotics:  IV vancomycin 7/1 >  IV Zosyn 7/1 >   Subjective: Right upper extremity pain continues to improve. No other complaints reported.  Objective: Filed Vitals:   12/03/14 1348 12/03/14 1525 12/03/14 2147 12/04/14 0555  BP: 122/70  119/82 121/84  Pulse: 84  84 87  Temp: 98.6 F (37 C)  98.9 F (37.2 C) 98.8 F (37.1 C)  TempSrc: Oral  Oral Oral  Resp: Height:   (1.727 m)    Weight:  67.132 kg (148 lb)    SpO2: 99%  99% 99%    Intake/Output Summary (Last 24 hours) at 12/04/14 1406 Last data filed at 12/04/14 0908  Gross per 24 hour  Intake   1493 ml    Output   1800 ml  Net   -307 ml   Filed Weights   12/02/14 1108 12/03/14 1525  Weight: 67.132 kg (148 lb) 67.132 kg (148 lb)     Exam:  General exam: Pleasant young male lying comfortably in bed.  Respiratory system: Clear. No increased work of breathing. Cardiovascular system: S1 & S2 heard, RRR. No JVD, murmurs, gallops, clicks or pedal edema. Gastrointestinal system: Abdomen is nondistended, soft and nontender. Normal bowel sounds heard. Central nervous system: Alert and oriented. No focal neurological deficits. Extremities: Symmetric 5 x 5 power.Right forearm dressing clean and dry. Able to move fingers well.   Data Reviewed: Basic Metabolic Panel:  Recent Labs Lab 12/02/14 1151 12/03/14 0349  NA 135 140  K 3.7 3.7  CL 99* 106  CO2 27 28  GLUCOSE 147* 119*  BUN 9 8  CREATININE 0.82 0.82  CALCIUM 9.3 8.5*   Liver Function Tests: No results for input(s): AST, ALT, ALKPHOS, BILITOT, PROT, ALBUMIN in the last 168 hours. No results for input(s): LIPASE, AMYLASE in the last 168 hours. No results for input(s): AMMONIA in the last 168 hours. CBC:  Recent Labs Lab 12/02/14 1151 12/03/14 0349  WBC 10.6* 7.4  NEUTROABS 8.1*  --   HGB 15.5 13.7  HCT 45.0 40.4  MCV 88.4 89.2  PLT 192 171   Cardiac Enzymes: No results for input(s): CKTOTAL, CKMB, CKMBINDEX, TROPONINI in the last 168 hours. BNP (last 3 results) No results for input(s): PROBNP in the  last 8760 hours. CBG: No results for input(s): GLUCAP in the last 168 hours.  Recent Results (from the past 240 hour(s))  Culture, blood (routine x 2)     Status: None (Preliminary result)   Collection Time: 12/02/14  1:15 PM  Result Value Ref Range Status   Specimen Description BLOOD RIGHT ANTECUBITAL  Final   Special Requests BOTTLES DRAWN AEROBIC AND ANAEROBIC  Final   Culture NO GROWTH < 24 HOURS  Final   Report Status PENDING  Incomplete  Culture, blood (routine x 2)     Status: None (Preliminary result)    Collection Time: 12/02/14  1:25 PM  Result Value Ref Range Status   Specimen Description BLOOD LEFT ANTECUBITAL  Final   Special Requests BOTTLES DRAWN AEROBIC AND ANAEROBIC  Final   Culture NO GROWTH < 24 HOURS  Final   Report Status PENDING  Incomplete  Surgical pcr screen     Status: Abnormal   Collection Time: 12/02/14  6:08 PM  Result Value Ref Range Status   MRSA, PCR POSITIVE (A) NEGATIVE Final    Comment: RESULT CALLED TO, READ BACK BY AND VERIFIED WITH: C.SHELTON RN 1610 12/03/14 E.GADDY    Staphylococcus aureus POSITIVE (A) NEGATIVE Final    Comment:        The Xpert SA Assay (FDA approved for NASAL specimens in patients over 36 years of age), is one component of a comprehensive surveillance program.  Test performance has been validated by [facility] for patients greater than or equal to 31 year old. It is not intended to diagnose infection nor to guide or monitor treatment.   Anaerobic culture     Status: None (Preliminary result)   Collection Time: 12/02/14  7:44 PM  Result Value Ref Range Status   Specimen Description ABSCESS RIGHT ARM  Final   Special Requests NONE  Final   Gram Stain   Final    MODERATE WBC PRESENT,BOTH PMN AND MONONUCLEAR NO SQUAMOUS EPITHELIAL CELLS SEEN FEW GRAM POSITIVE COCCI IN PAIRS IN CLUSTERS Performed at [facility]    Culture   Final    NO ANAEROBES ISOLATED; CULTURE IN PROGRESS FOR 5 DAYS Performed at [facility]    Report Status PENDING  Incomplete  Culture, routine-abscess     Status: None (Preliminary result)   Collection Time: 12/02/14  7:44 PM  Result Value Ref Range Status   Specimen Description ABSCESS RIGHT ARM  Final   Special Requests NONE  Final   Gram Stain   Final    MODERATE WBC PRESENT,BOTH PMN AND MONONUCLEAR NO SQUAMOUS EPITHELIAL CELLS SEEN FEW GRAM POSITIVE COCCI IN PAIRS IN CLUSTERS Performed at [facility]    Culture   Final    ABUNDANT STAPHYLOCOCCUS  AUREUS Note: RIFAMPIN AND GENTAMICIN SHOULD NOT BE USED AS SINGLE DRUGS FOR TREATMENT OF STAPH INFECTIONS. Performed at [facility]    Report Status PENDING  Incomplete        Studies: No results found.      Scheduled Meds: . Chlorhexidine Gluconate Cloth  6 each Topical Q0600  . docusate sodium  100 mg Oral BID  . mupirocin ointment  1 application Nasal BID  . piperacillin-tazobactam (ZOSYN)  IV  3.375 g Intravenous 3 times per day  . vancomycin  1,000 mg Intravenous Q8H   Continuous Infusions:    Principal Problem:   Cellulitis of forearm, right Active Problems:   Polysubstance abuse   Nicotine abuse  Hepatitis C   Cellulitis and abscess of hand   Abscess of left forearm    Time spent: 25 minutes.    Marcellus ScottHONGALGI,Maitri Schnoebelen, MD, FACP, FHM. [name] Pager [phone]  If 7PM-7AM, please contact night-coverage [email or web address] Password [email or web address] 12/04/2014, 2:06 PM    LOS: 2 days

## 2014-12-04 NOTE — Progress Notes (Signed)
Patient and girlfriend had an argument in pt's room, secretary heard loud noise, knocked and opened the door to checked what was going on. She saw the couple arguing and lady was crying. I went in and checked, patient was trying to remove his IV line and said that he's out of here. Secretary went ahead and called security who responded quickly. Security and officers came and spoke to them. I explained to patient that he needs to finish his IV medications but was not listening and proceeded to go out of unit. Police officer stopped him and I was able to make him sign AMA form. Dr. Waymon AmatoHongalgi was made aware about incident.

## 2014-12-04 NOTE — ED Notes (Signed)
Pt left AMA from [facility] at 3 pm-he has an active MRSA infection in right arm

## 2014-12-04 NOTE — Discharge Instructions (Signed)
KEEP BANDAGE CLEAN AND DRY CALL OFFICE FOR F/U APPT [id number] IN 7 DAYS DO NOT REMOVE BANDAGE KEEP HAND ELEVATED ABOVE HEART OK TO APPLY ICE TO OPERATIVE AREA CONTACT OFFICE IF ANY WORSENING PAIN OR CONCERNS.

## 2014-12-05 LAB — CULTURE, ROUTINE-ABSCESS

## 2014-12-05 NOTE — Discharge Summary (Addendum)
Physician Discharge Summary  Christopher Berg:[id number] DOB: 23-Jul-1980 DOA: 12/02/2014  PCP: Terald Sleeper, MD  Admit date: 12/02/2014 Discharge date: 12/05/2014  Patient left the hospital AGAINST MEDICAL ADVICE on 12/04/14  Recommendations for Outpatient Follow-up:  1. Patient left hospital AGAINST MEDICAL ADVICE without waiting for formal discharge process or clearance by M.D. He is supposed to see the hand surgeon in 8 weeks post discharge.  Discharge Diagnoses:  Principal Problem:   Cellulitis of forearm, right Active Problems:   Polysubstance abuse   Nicotine abuse   Hepatitis C   Cellulitis and abscess of hand   Abscess of left forearm   Discharge Condition: Guarded and at risk for decline  Diet recommendation: Left AMA  Filed Weights   12/02/14 1108 12/03/14 1525  Weight: 67.132 kg (148 lb) 67.132 kg (148 lb)    History of present illness:  34 year old male with history of IVDA, hep C, anxiety, depression, polysubstance abuse, epidural space abscess admitted to [facility] on 12/02/14 with cellulitis and abscess of forearm near right wrist. History of IV methamphetamine use 3 days ago. Status post I&D by hand surgeon 7/1. He was getting IV vancomycin and Zosyn while cultures were still pending. His right forearm was clinically improving. Dr. Melvyn Novas changed his wound and packed it on 12/04/14. The wound was looking much better. He was advised to keep dressing on at all times, follow-up with hand surgeon in 8 days, do not remove bandage. Soon after and surgeons visit, patient and his girlfriend apparently had an argument. Patient then removed IV. Security was alerted. Patient's nursing counseled patient regarding completing his IV medications but he would not listen. He signed out AMA.  From review of records, it appears as though he returned to ED a couple of hours later and was seen and discharged from ED.  Wound cultures confirm MSSA.   Consultations:  Hand surgery/Dr.  Melvyn Novas  Procedures:  Incision and drainage of right forearm abscess    Discharge Instructions Patient left AMA without waiting for formal discharge process, discharge medications or follow-up recommendations.    Medication List    Notice    You have not been prescribed any medications.     Follow-up Information    Follow up with Sharma Covert, MD. Schedule an appointment as soon as possible for a visit in 8 days.   Specialty:  Orthopedic Surgery   Contact information:   [address] [place] [address] [phone]        The results of significant diagnostics from this hospitalization (including imaging, microbiology, ancillary and laboratory) are listed below for reference.    Significant Diagnostic Studies: No results found.  Microbiology: Recent Results (from the past 240 hour(s))  Culture, blood (routine x 2)     Status: None (Preliminary result)   Collection Time: 12/02/14  1:15 PM  Result Value Ref Range Status   Specimen Description BLOOD RIGHT ANTECUBITAL  Final   Special Requests BOTTLES DRAWN AEROBIC AND ANAEROBIC  Final   Culture NO GROWTH 3 DAYS  Final   Report Status PENDING  Incomplete  Culture, blood (routine x 2)     Status: None (Preliminary result)   Collection Time: 12/02/14  1:25 PM  Result Value Ref Range Status   Specimen Description BLOOD LEFT ANTECUBITAL  Final   Special Requests BOTTLES DRAWN AEROBIC AND ANAEROBIC  Final   Culture NO GROWTH 3 DAYS  Final   Report Status PENDING  Incomplete  Surgical pcr  screen     Status: Abnormal   Collection Time: 12/02/14  6:08 PM  Result Value Ref Range Status   MRSA, PCR POSITIVE (A) NEGATIVE Final    Comment: RESULT CALLED TO, READ BACK BY AND VERIFIED WITH: C.SHELTON RN 1610 12/03/14 E.GADDY    Staphylococcus aureus POSITIVE (A) NEGATIVE Final    Comment:        The Xpert SA Assay (FDA approved for NASAL specimens in patients over 21 years of age), is one  component of a comprehensive surveillance program.  Test performance has been validated by [facility] for patients greater than or equal to 82 year old. It is not intended to diagnose infection nor to guide or monitor treatment.   Anaerobic culture     Status: None (Preliminary result)   Collection Time: 12/02/14  7:44 PM  Result Value Ref Range Status   Specimen Description ABSCESS RIGHT ARM  Final   Special Requests NONE  Final   Gram Stain   Final    MODERATE WBC PRESENT,BOTH PMN AND MONONUCLEAR NO SQUAMOUS EPITHELIAL CELLS SEEN FEW GRAM POSITIVE COCCI IN PAIRS IN CLUSTERS Performed at [facility]    Culture   Final    NO ANAEROBES ISOLATED; CULTURE IN PROGRESS FOR 5 DAYS Performed at [facility]    Report Status PENDING  Incomplete  Culture, routine-abscess     Status: None   Collection Time: 12/02/14  7:44 PM  Result Value Ref Range Status   Specimen Description ABSCESS RIGHT ARM  Final   Special Requests NONE  Final   Gram Stain   Final    MODERATE WBC PRESENT,BOTH PMN AND MONONUCLEAR NO SQUAMOUS EPITHELIAL CELLS SEEN FEW GRAM POSITIVE COCCI IN PAIRS IN CLUSTERS Performed at [facility]    Culture   Final    ABUNDANT STAPHYLOCOCCUS AUREUS Note: RIFAMPIN AND GENTAMICIN SHOULD NOT BE USED AS SINGLE DRUGS FOR TREATMENT OF STAPH INFECTIONS. Performed at [facility]    Report Status 12/05/2014 FINAL  Final   Organism ID, Bacteria STAPHYLOCOCCUS AUREUS  Final      Susceptibility   Staphylococcus aureus - MIC*    CLINDAMYCIN <=0.25 SENSITIVE Sensitive     ERYTHROMYCIN <=0.25 SENSITIVE Sensitive     GENTAMICIN <=0.5 SENSITIVE Sensitive     LEVOFLOXACIN 4 INTERMEDIATE Intermediate     OXACILLIN <=0.25 SENSITIVE Sensitive     PENICILLIN <=0.03 SENSITIVE Sensitive     RIFAMPIN <=0.5 SENSITIVE Sensitive     TRIMETH/SULFA <=10 SENSITIVE Sensitive     VANCOMYCIN 1 SENSITIVE Sensitive     TETRACYCLINE <=1 SENSITIVE Sensitive      MOXIFLOXACIN 2 RESISTANT Resistant     * ABUNDANT STAPHYLOCOCCUS AUREUS     Labs: Basic Metabolic Panel:  Recent Labs Lab 12/02/14 1151 12/03/14 0349  NA 135 140  K 3.7 3.7  CL 99* 106  CO2 27 28  GLUCOSE 147* 119*  BUN 9 8  CREATININE 0.82 0.82  CALCIUM 9.3 8.5*   Liver Function Tests: No results for input(s): AST, ALT, ALKPHOS, BILITOT, PROT, ALBUMIN in the last 168 hours. No results for input(s): LIPASE, AMYLASE in the last 168 hours. No results for input(s): AMMONIA in the last 168 hours. CBC:  Recent Labs Lab 12/02/14 1151 12/03/14 0349  WBC 10.6* 7.4  NEUTROABS 8.1*  --   HGB 15.5 13.7  HCT 45.0 40.4  MCV 88.4 89.2  PLT 192 171   Cardiac Enzymes: No results for input(s): CKTOTAL, CKMB, CKMBINDEX,  TROPONINI in the last 168 hours. BNP: BNP (last 3 results) No results for input(s): BNP in the last 8760 hours.  ProBNP (last 3 results) No results for input(s): PROBNP in the last 8760 hours.  CBG: No results for input(s): GLUCAP in the last 168 hours.     Signed:  Marcellus ScottHONGALGI,Willys Salvino, MD, FACP, FHM. [name] Pager [phone]  If 7PM-7AM, please contact night-coverage [email or web address] Password [email or web address] 12/05/2014, 4:41 PM

## 2014-12-06 ENCOUNTER — Encounter (HOSPITAL_COMMUNITY): Payer: Self-pay | Admitting: Orthopedic Surgery

## 2014-12-07 LAB — CULTURE, BLOOD (ROUTINE X 2)
CULTURE: NO GROWTH
Culture: NO GROWTH

## 2014-12-07 LAB — ANAEROBIC CULTURE

## 2015-03-17 ENCOUNTER — Emergency Department (HOSPITAL_COMMUNITY)
Admission: EM | Admit: 2015-03-17 | Discharge: 2015-03-17 | Disposition: A | Discharge status: Home / Self Care | Payer: No Typology Code available for payment source | Attending: Emergency Medicine | Admitting: Emergency Medicine

## 2015-03-17 ENCOUNTER — Encounter (HOSPITAL_COMMUNITY): Payer: Self-pay | Admitting: Emergency Medicine

## 2015-03-17 ENCOUNTER — Emergency Department (HOSPITAL_COMMUNITY): Payer: No Typology Code available for payment source

## 2015-03-17 DIAGNOSIS — Z87442 Personal history of urinary calculi: Secondary | ICD-10-CM | POA: Diagnosis not present

## 2015-03-17 DIAGNOSIS — Z8619 Personal history of other infectious and parasitic diseases: Secondary | ICD-10-CM | POA: Insufficient documentation

## 2015-03-17 DIAGNOSIS — Y9289 Other specified places as the place of occurrence of the external cause: Secondary | ICD-10-CM | POA: Diagnosis not present

## 2015-03-17 DIAGNOSIS — S50312A Abrasion of left elbow, initial encounter: Secondary | ICD-10-CM | POA: Insufficient documentation

## 2015-03-17 DIAGNOSIS — G8929 Other chronic pain: Secondary | ICD-10-CM | POA: Diagnosis not present

## 2015-03-17 DIAGNOSIS — Z792 Long term (current) use of antibiotics: Secondary | ICD-10-CM | POA: Insufficient documentation

## 2015-03-17 DIAGNOSIS — J45909 Unspecified asthma, uncomplicated: Secondary | ICD-10-CM | POA: Insufficient documentation

## 2015-03-17 DIAGNOSIS — L03114 Cellulitis of left upper limb: Secondary | ICD-10-CM | POA: Insufficient documentation

## 2015-03-17 DIAGNOSIS — S80212A Abrasion, left knee, initial encounter: Secondary | ICD-10-CM | POA: Diagnosis not present

## 2015-03-17 DIAGNOSIS — F1910 Other psychoactive substance abuse, uncomplicated: Secondary | ICD-10-CM

## 2015-03-17 DIAGNOSIS — F1110 Opioid abuse, uncomplicated: Secondary | ICD-10-CM | POA: Insufficient documentation

## 2015-03-17 DIAGNOSIS — F1310 Sedative, hypnotic or anxiolytic abuse, uncomplicated: Secondary | ICD-10-CM | POA: Diagnosis not present

## 2015-03-17 DIAGNOSIS — F1510 Other stimulant abuse, uncomplicated: Secondary | ICD-10-CM | POA: Diagnosis not present

## 2015-03-17 DIAGNOSIS — T401X1A Poisoning by heroin, accidental (unintentional), initial encounter: Secondary | ICD-10-CM | POA: Insufficient documentation

## 2015-03-17 DIAGNOSIS — R52 Pain, unspecified: Secondary | ICD-10-CM

## 2015-03-17 DIAGNOSIS — R000 Tachycardia, unspecified: Secondary | ICD-10-CM | POA: Diagnosis not present

## 2015-03-17 DIAGNOSIS — Z720 Tobacco use: Secondary | ICD-10-CM | POA: Diagnosis not present

## 2015-03-17 DIAGNOSIS — Z87440 Personal history of urinary (tract) infections: Secondary | ICD-10-CM | POA: Insufficient documentation

## 2015-03-17 DIAGNOSIS — Y998 Other external cause status: Secondary | ICD-10-CM | POA: Insufficient documentation

## 2015-03-17 DIAGNOSIS — Y9389 Activity, other specified: Secondary | ICD-10-CM | POA: Insufficient documentation

## 2015-03-17 LAB — URINE MICROSCOPIC-ADD ON

## 2015-03-17 LAB — COMPREHENSIVE METABOLIC PANEL
ALT: 116 U/L — AB (ref 17–63)
AST: 60 U/L — ABNORMAL HIGH (ref 15–41)
Albumin: 4.5 g/dL (ref 3.5–5.0)
Alkaline Phosphatase: 62 U/L (ref 38–126)
Anion gap: 8 (ref 5–15)
BUN: 16 mg/dL (ref 6–20)
CHLORIDE: 104 mmol/L (ref 101–111)
CO2: 25 mmol/L (ref 22–32)
Calcium: 9.3 mg/dL (ref 8.9–10.3)
Creatinine, Ser: 0.76 mg/dL (ref 0.61–1.24)
GFR calc non Af Amer: >60 mL/min (ref >60)
Glucose, Bld: 84 mg/dL (ref 65–99)
Potassium: 4.2 mmol/L (ref 3.5–5.1)
Sodium: 137 mmol/L (ref 135–145)
Total Bilirubin: 1.2 mg/dL (ref 0.3–1.2)
Total Protein: 7.9 g/dL (ref 6.5–8.1)

## 2015-03-17 LAB — URINALYSIS, ROUTINE W REFLEX MICROSCOPIC
Bilirubin Urine: NEGATIVE
Glucose, UA: NEGATIVE mg/dL
HGB URINE DIPSTICK: NEGATIVE
KETONES UR: NEGATIVE mg/dL
Leukocytes, UA: NEGATIVE
Nitrite: NEGATIVE
PROTEIN: 30 mg/dL — AB
Specific Gravity, Urine: 1.021 (ref 1.005–1.030)
Urobilinogen, UA: 0.2 mg/dL (ref 0.0–1.0)
pH: 6 (ref 5.0–8.0)

## 2015-03-17 LAB — CBC WITH DIFFERENTIAL/PLATELET
Basophils Absolute: 0 10*3/uL (ref 0.0–0.1)
Basophils Relative: 0 %
EOS PCT: 1 %
Eosinophils Absolute: 0.1 10*3/uL (ref 0.0–0.7)
HCT: 41.8 % (ref 39.0–52.0)
Hemoglobin: 14.7 g/dL (ref 13.0–17.0)
LYMPHS ABS: 2 10*3/uL (ref 0.7–4.0)
LYMPHS PCT: 21 %
MCH: 30.6 pg (ref 26.0–34.0)
MCHC: 35.2 g/dL (ref 30.0–36.0)
MCV: 86.9 fL (ref 78.0–100.0)
MONO ABS: 0.8 10*3/uL (ref 0.1–1.0)
Monocytes Relative: 8 %
Neutro Abs: 6.7 10*3/uL (ref 1.7–7.7)
Neutrophils Relative %: 70 %
PLATELETS: 171 10*3/uL (ref 150–400)
RBC: 4.81 MIL/uL (ref 4.22–5.81)
RDW: 12.7 % (ref 11.5–15.5)
WBC: 9.7 10*3/uL (ref 4.0–10.5)

## 2015-03-17 LAB — RAPID URINE DRUG SCREEN, HOSP PERFORMED
Amphetamines: POSITIVE — AB
BENZODIAZEPINES: POSITIVE — AB
Barbiturates: NOT DETECTED
Cocaine: NOT DETECTED
Opiates: POSITIVE — AB
Tetrahydrocannabinol: NOT DETECTED

## 2015-03-17 LAB — ETHANOL

## 2015-03-17 MED ORDER — NALOXONE HCL 1 MG/ML IJ SOLN
1.0000 mg | Freq: Once | INTRAMUSCULAR | Status: AC
  Administered 2015-03-17: 1 mg via INTRAVENOUS

## 2015-03-17 MED ORDER — ONDANSETRON HCL 4 MG/2ML IJ SOLN
4.0000 mg | Freq: Once | INTRAMUSCULAR | Status: AC
  Administered 2015-03-17: 4 mg via INTRAVENOUS
  Filled 2015-03-17: qty 2

## 2015-03-17 MED ORDER — SODIUM CHLORIDE 0.9 % IV BOLUS (SEPSIS)
2000.0000 mL | Freq: Once | INTRAVENOUS | Status: AC
  Administered 2015-03-17: 2000 mL via INTRAVENOUS

## 2015-03-17 MED ORDER — NALOXONE HCL 1 MG/ML IJ SOLN
INTRAMUSCULAR | Status: AC
  Administered 2015-03-17: 1 mg via INTRAVENOUS
  Filled 2015-03-17: qty 2

## 2015-03-17 MED ORDER — CLINDAMYCIN HCL 150 MG PO CAPS: 300.0000 mg | ORAL_CAPSULE | Freq: Three times a day (TID) | ORAL | Status: AC

## 2015-03-17 NOTE — ED Notes (Signed)
Pt given food as requested. Ok per MD.

## 2015-03-17 NOTE — ED Notes (Signed)
Pt unable to provide urine specimen. Pt requested to use phone. Phone provided for pt.

## 2015-03-17 NOTE — ED Notes (Signed)
Pt unable to provide a urine sample. Pt tearful. Warm blanket provided.  Will continue to monitor.

## 2015-03-17 NOTE — ED Notes (Signed)
Officer Sopko would like to be notified if patient condition worsens. His number is [phone]

## 2015-03-17 NOTE — ED Notes (Signed)
Patient transported to X-ray 
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
## 2015-03-17 NOTE — ED Notes (Signed)
Pt attempting to provide a urine sample.

## 2015-03-17 NOTE — ED Notes (Signed)
Pt. Transported to CT 

## 2015-03-17 NOTE — ED Notes (Signed)
Pt had desat to 74 %. MD Ranae PalmsYelverton at bedside.

## 2015-03-17 NOTE — ED Notes (Signed)
Bed: WA16 Expected date:  Expected time:  Means of arrival:  Comments: EMS 
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
## 2015-03-17 NOTE — ED Notes (Signed)
Pt made aware of need for urine sample, urinal provided. 

## 2015-03-17 NOTE — Discharge Instructions (Signed)
Accidental Overdose °A drug overdose occurs when a chemical substance (drug or medication) is used in amounts large enough to overcome a person. This may result in severe illness or death. This is a type of poisoning. Accidental overdoses of medications or other substances come from a variety of reasons. When this happens accidentally, it is often because the person taking the substance does not know enough about what they have taken. Drugs which commonly cause overdose deaths are alcohol, psychotropic medications (medications which affect the mind), pain medications, illegal drugs (street drugs) such as cocaine and heroin, and multiple drugs taken at the same time. It may result from careless behavior (such as over-indulging at a party). Other causes of overdose may include multiple drug use, a lapse in memory, or drug use after a period of no drug use.  °Sometimes overdosing occurs because a person cannot remember if they have taken their medication.  °A common unintentional overdose in young children involves multi-vitamins containing iron. Iron is a part of the hemoglobin molecule in blood. It is used to transport oxygen to living cells. When taken in small amounts, iron allows the body to restock hemoglobin. In large amounts, it causes problems in the body. If this overdose is not treated, it can lead to death. °Never take medicines that show signs of tampering or do not seem quite right. Never take medicines in the dark or in poor lighting. Read the label and check each dose of medicine before you take it. When adults are poisoned, it happens most often through carelessness or lack of information. Taking medicines in the dark or taking medicine prescribed for someone else to treat the same type of problem is a dangerous practice. °SYMPTOMS  °Symptoms of overdose depend on the medication and amount taken. They can vary from over-activity with stimulant over-dosage, to sleepiness from depressants such as  alcohol, narcotics and tranquilizers. Confusion, dizziness, nausea and vomiting may be present. If problems are severe enough coma and death may result. °DIAGNOSIS  °Diagnosis and management are generally straightforward if the drug is known. Otherwise it is more difficult. At times, certain symptoms and signs exhibited by the patient, or blood tests, can reveal the drug in question.  °TREATMENT  °In an emergency department, most patients can be treated with supportive measures. Antidotes may be available if there has been an overdose of opioids or benzodiazepines. A rapid improvement will often occur if this is the cause of overdose. °At home or away from medical care: °· There may be no immediate problems or warning signs in children. °· Not everything works well in all cases of poisoning. °· Take immediate action. Poisons may act quickly. °· If you think someone has swallowed medicine or a household product, and the person is unconscious, having seizures (convulsions), or is not breathing, immediately call for an ambulance. °IF a person is conscious and appears to be doing OK but has swallowed a poison: °· Do not wait to see what effect the poison will have. Immediately call a poison control center (listed in the white pages of your telephone book under "Poison Control" or inside the front cover with other emergency numbers). Some poison control centers have TTY capability for the deaf. Check with your local center if you or someone in your family requires this service. °· Keep the container so you can read the label on the product for ingredients. °· Describe what, when, and how much was taken and the age and condition of the person poisoned.   Inform them if the person is vomiting, choking, drowsy, shows a change in color or temperature of skin, is conscious or unconscious, or is convulsing. °· Do not cause vomiting unless instructed by medical personnel. Do not induce vomiting or force liquids into a person who  is convulsing, unconscious, or very drowsy. °Stay calm and in control.  °· Activated charcoal also is sometimes used in certain types of poisoning and you may wish to add a supply to your emergency medicines. It is available without a prescription. Call a poison control center before using this medication. °PREVENTION  °Thousands of children die every year from unintentional poisoning. This may be from household chemicals, poisoning from carbon monoxide in a car, taking their parent's medications, or simply taking a few iron pills or vitamins with iron. Poisoning comes from unexpected sources. °· Store medicines out of the sight and reach of children, preferably in a locked cabinet. Do not keep medications in a food cabinet. Always store your medicines in a secure place. Get rid of expired medications. °· If you have children living with you or have them as occasional guests, you should have child-resistant caps on your medicine containers. Keep everything out of reach. Child proof your home. °· If you are called to the telephone or to answer the door while you are taking a medicine, take the container with you or put the medicine out of the reach of small children. °· Do not take your medication in front of children. Do not tell your child how good a medication is and how good it is for them. They may get the idea it is more of a treat. °· If you are an adult and have accidentally taken an overdose, you need to consider how this happened and what can be done to prevent it from happening again. If this was from a street drug or alcohol, determine if there is a problem that needs addressing. If you are not sure a problems exists, it is easy to talk to a professional and ask them if they think you have a problem. It is better to handle this problem in this way before it happens again and has a much worse consequence. °  °This information is not intended to replace advice given to you by your health care provider. Make  sure you discuss any questions you have with your health care provider. °  °Document Released: 08/03/2004 Document Revised: 06/10/2014 Document Reviewed: 11/07/2014 °Elsevier Interactive Patient Education ©2016 Elsevier Inc. ° °Narcotic Overdose °A narcotic overdose is the misuse or overuse of a narcotic drug. A narcotic overdose can make you pass out and stop breathing. If you are not treated right away, this can cause permanent brain damage or stop your heart. Medicine may be given to reverse the effects of an overdose. If so, this medicine may bring on withdrawal symptoms. The symptoms may be abdominal cramps, throwing up (vomiting), sweating, chills, and nervousness. °Injecting narcotics can cause more problems than just an overdose. AIDS, hepatitis, and other very serious infections are transmitted by sharing needles and syringes. If you decide to quit using, there are medicines which can help you through the withdrawal period. Trying to quit all at once on your own can be uncomfortable, but not life-threatening. Call your caregiver, Narcotics Anonymous, or any drug and alcohol treatment program for further help.  °  °This information is not intended to replace advice given to you by your health care provider. Make sure you discuss any questions   you have with your health care provider.   Document Released: 06/27/2004 Document Revised: 06/10/2014 Document Reviewed: 11/09/2014 Elsevier Interactive Patient Education 2016 [employer].  Emergency Department Resource Guide 1) Find a Doctor and Pay Out of Pocket Although you won't have to find out who is covered by your insurance plan, it is a good idea to ask around and get recommendations. You will then need to call the office and see if the doctor you have chosen will accept you as a new patient and what types of options they offer for patients who are self-pay. Some doctors offer discounts or will set up payment plans for their patients who do not have  insurance, but you will need to ask so you aren't surprised when you get to your appointment.  2) Contact Your Local Health Department Not all health departments have doctors that can see patients for sick visits, but many do, so it is worth a call to see if yours does. If you don't know where your local health department is, you can check in your phone book. The CDC also has a tool to help you locate your state's health department, and many state websites also have listings of all of their local health departments.  3) Find a Walk-in Clinic If your illness is not likely to be very severe or complicated, you may want to try a walk in clinic. These are popping up all over the country in pharmacies, drugstores, and shopping centers. They're usually staffed by nurse practitioners or physician assistants that have been trained to treat common illnesses and complaints. They're usually fairly quick and inexpensive. However, if you have serious medical issues or chronic medical problems, these are probably not your best option.  No Primary Care Doctor: - Call Health Connect at  [phone] - they can help you locate a primary care doctor that  accepts your insurance, provides certain services, etc. - Physician Referral Service- [phone]  Chronic Pain Problems: Organization         Address  Phone   Notes  [facility]  [phone] Patients need to be referred by their primary care doctor.   Medication Assistance: Organization         Address  Phone   Notes  [facility] Medication [facility] [address] [place] [place], [place] [address] [phone] --Must be a resident of [facility] -- Must have NO insurance coverage whatsoever (no Medicaid/ Medicare, etc.) -- The pt. MUST have a primary care doctor that directs their care regularly and follows them in the community   [facility]  [phone]   [employer]  [phone]    Agencies that provide  inexpensive medical care: Organization         Address  Phone   Notes  [facility]  [phone]   [facility]    [phone]   [facility] [address] [place], [place] [address] [phone]   [facility] [address] [place], [place] [phone]   Planned Parenthood    [phone]   [facility]    [phone]   Community Health and [facility]  [address], [place] Phone:  [phone], Fax:  [phone] Hours of Operation:  9 am - 6 pm, M-F.  Also accepts Medicaid/Medicare and self-pay.  [facility]  [address], [address]  Phone: [phone], Fax: [phone]. Hours of Operation:  8:30 am - 5:30 pm, M-F.  Also accepts Medicaid and self-pay.  [facility] [place] [address], [place] Phone: [phone](336) [phone]   [facility] [address] [place] [address], [place], [place] [phone], Ext. [phone] Mondays & Thursdays: 7-9 AM.  First 15 patients are seen on a first come, first serve basis.    Medicaid-accepting [facility] Providers:  Organization         Address  Phone   Notes  [facility] [address], [place] [phone] Also accepts self-pay patients.  [facility] [address], [place]  [phone]   [facility] [address], [place] [phone]   [facility] [address] [place] [address], [place] [phone](336) [phone]   Renaye RakersVeita Bland [address], [place]   [phone] Only accepts [place] Access [place] patients after they have their name applied to their card.   Self-Pay (no insurance) in [facility]:  Organization         Address  Phone   Notes  Sickle Cell Patients, [facility] [address], [place] [phone]) [phone]   [facility] [address], [place] [phone]   [facility]  [address], [place] [phone]   [facility]/Dr. Osei-Bonsu  [address] [place] [address], [place] or [phone], [place] [phone]) [phone] Phone number for both [place] and [place] locations is the same.  [facility]gent [facility] and [facility] [address], [place] [phone]) [phone]   [facility] [address] [place] [address], [place] or [address] [phone]   [facility] [address], [place] [phone](336) [phone], phone; [phone](336) [phone], fax Sees patients 1st and 3rd Saturday of every month.  Must not qualify for public or private insurance (i.e. Medicaid, Medicare, [place] Health Choice, Veterans' Benefits)  Household income should be no more than 200% of the poverty level The clinic cannot treat you if you are pregnant or think you are pregnant  Sexually transmitted diseases are not treated at the clinic.    Dental Care: Organization         Address  Phone  Notes  [facility] Department of [facility] [address], [place] [phone] Accepts children up to age 33 who are enrolled in [place] or [place] Health Choice; pregnant women with a Medicaid card; and children who have applied for Medicaid or [place] Health Choice, but were declined, whose parents can pay a reduced fee at time of service.  [facility] of [facility] [place]  [address], [place] [phone]) [phone] Accepts children up to age 33 who are enrolled in [place] or [place] Health Choice; pregnant women with a Medicaid card; and children who have applied for Medicaid or [place] Health Choice, but were declined, whose parents can pay a reduced fee at time of service.  [facility]  [address], [place] [phone]) [phone] Patients are seen by appointment only. Walk-ins are  not accepted. [facility] will see patients 33 years of age and older. Monday - Tuesday (8am-5pm) Most Wednesdays (8:30-5pm) $30 per visit, cash only  [facility]  [address], [facility] [phone] Patients are seen by appointment only. Walk-ins are not accepted. [facility] will see patients 35 years of age and older. One Wednesday Evening (Monthly: Volunteer Based).  $30 per visit, cash only  [employer]  [phone] for adults; Children under age 60, call Graduate Pediatric Dentistry at [phone]. Children aged 20-14, please call [phone] to request a pediatric application.  Dental services are provided in all areas of dental care including fillings, crowns and bridges, complete and partial dentures, implants, gum treatment, root canals, and extractions. Preventive care is also provided. Treatment is provided to both adults and children. Patients are selected via a lottery and there is often a waiting list.   [facility] [address] [place]  [phone] [email or web address]   [facility] [address] [place], [place] [phone], Ext. [phone] Second and Fourth Thursday of each month, opens at 6:30 AM; Clinic ends at 9 AM.  Patients are seen on a first-come first-served basis, and a limited number are seen during each clinic.   [facility]  [address] [place], [place] [phone]   Eligibility Requirements You must have lived in [place], [place], or [place] counties for at least the last three months.   You cannot be eligible for state or federal sponsored [place], including [employer], [place], or [employer].   You generally cannot be eligible for healthcare insurance through your employer.    How to apply: Eligibility screenings are held every Tuesday and Wednesday afternoon from 1:00 pm until 4:00 pm. You do not need an appointment for  the interview!  [facility] [address] [place], [place] [phone]   [facility] Health Department  [phone]   [facility] Health Department  [phone]   [facility] Health Department  [phone]    Behavioral Health Resources in the Community: Intensive Outpatient Programs Organization         Address  Phone  Notes  [facility] [address] [place], [place] [phone]   [facility] Outpatient [address], [place], [place] [phone]   [facility] [address] [place], [place]  [phone]   [facility] Mental Health [address]  [place], [place] [phone] or [phone]   Substance Abuse Resources Organization         Address  Phone  Notes  [facility] Drug Services  [phone]   [facility]  [phone]   [facility] [place]  [phone]   Floydene Flock  [phone]   Residential & Outpatient Substance Abuse Program  [phone]   Psychological Services Organization         Address  Phone  Notes  [facility] Behavioral Health  [phone]   [facility]  [phone]   [facility] Mental Health [address] [place] [phone] or [phone]    Mobile Crisis Teams Organization         Address  Phone  Notes  [facility], Mobile Crisis Care Unit  [phone]   [facility]  [address] [place], [place] [phone]   Doristine Locks [address] [place] [phone]    Self-Help/Support Groups Organization         Address  Phone             Notes  [facility] [place] - variety of support  groups  [phone] Call for more information  Narcotics Anonymous (NA), Caring [facility] [address], [employer] [place]  2 meetings at this location   Residential Sports administrator         Address  Phone  Notes  [facility]  [address],    [place]  [phone]   [facility]  [address] [place], [place], [place] [phone]   [facility] [address] [place], [place] [phone] Admissions: 8am-3pm M-F  [facility] [address],    [place], [place] [phone]   [facility] [address] [place], [place], [place] [phone]   [facility] [address],  [place], [place] [phone]   [facility] - Intensive Outpatient [address], [place], [place] [phone]   [facility] ([facility] [address]  [place], [place] [phone] or [phone]   [facility] (RT[facility]) [address], [place], [place] [phone] Accepts Medicaid  [facility] [address]  [place] [phone] Substance Abuse/Addiction Treatment   [facility] Organization         Address  Phone  Notes  [facility]  [phone]   Angie Fava, PhD [address] [place], [place]   [phone] or [phone]   [facility] Behavioral   [address] [place], [place] [phone]   [facility] [address], [place], [place] [phone] Insurance/Medicaid/sponsorship through [facility] and Families [address]                                    [place], [place] [phone] Therapy/tele-psych/case  [facility] [address], [place] [phone]    Dr. Lolly Mustache  [phone]   [facility]  [employer] [facility] Dept. 1) [address] [place] 2) [address] [place] 3)  [address], [place] [phone]  [phone]   [facility] Child Abuse Hotline [phone] or [phone] (After Hours)

## 2015-03-17 NOTE — ED Notes (Signed)
Pt repeatedly crying and stating "I was going to bail my girlfriend out today". Pt then stating "I feel terrible, I've never felt this bad".

## 2015-03-17 NOTE — ED Notes (Signed)
Pt is easily awakened and oriented x 4.
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
## 2015-03-17 NOTE — ED Provider Notes (Signed)
  Physical Exam  BP 103/67 mmHg  Pulse 90  Temp(Src) 98.5 F (36.9 C) (Oral)  Resp 9  SpO2 100%  Physical Exam  Constitutional: He appears well-developed and well-nourished. No distress.  Pulmonary/Chest: Effort normal. No respiratory distress. He has no wheezes.  Neurological: GCS eye subscore is 3. GCS verbal subscore is 5. GCS motor subscore is 6.  Skin: He is not diaphoretic.    ED Course  Procedures  MDM 34yo male presents with concern for polysubstance overdose.  Patient received narcan in field with improvement, and again became somnolent requiring narcan in the ED.  Observing at this time for recurrence of somnolence. Patient hemodynamically stable on my evaluation, sleepy however wakes to voice, follows commands.  Will continue to monitor.   Patient with continued monitoring for 5 hours following Narcan without return of respiratory depression. He is awake, alert, and appropriate. He has swelling and erythema of his right hand. Reports a bee sting prior to episode last night.  Patient is adamant that this is not secondary to IV drug abuse. Given patient's prior history, x-ray was ordered to evaluate for signs of foreign body and was negative.  No sign of abscess, extensor nor flexor tenosynovitis at this time. Patient most likely with either local reaction to bee sting or other injury with possible secondary cellulitis. We'll treat for cellulitis with clindamycin for 7 days. Recommended close PCP follow-up.     Alvira MondayErin Nikoleta Dady, MD 03/18/15 [phone]

## 2018-01-07 ENCOUNTER — Encounter (HOSPITAL_COMMUNITY): Payer: Self-pay

## 2018-01-07 ENCOUNTER — Other Ambulatory Visit: Payer: Self-pay

## 2018-01-07 ENCOUNTER — Emergency Department (HOSPITAL_COMMUNITY)
Admission: EM | Admit: 2018-01-07 | Discharge: 2018-01-07 | Disposition: A | Discharge status: Home / Self Care | Payer: 59 | Attending: Emergency Medicine | Admitting: Emergency Medicine

## 2018-01-07 DIAGNOSIS — Z79899 Other long term (current) drug therapy: Secondary | ICD-10-CM | POA: Insufficient documentation

## 2018-01-07 DIAGNOSIS — F17210 Nicotine dependence, cigarettes, uncomplicated: Secondary | ICD-10-CM | POA: Diagnosis not present

## 2018-01-07 DIAGNOSIS — J45909 Unspecified asthma, uncomplicated: Secondary | ICD-10-CM | POA: Diagnosis not present

## 2018-01-07 DIAGNOSIS — L089 Local infection of the skin and subcutaneous tissue, unspecified: Secondary | ICD-10-CM | POA: Insufficient documentation

## 2018-01-07 DIAGNOSIS — R222 Localized swelling, mass and lump, trunk: Secondary | ICD-10-CM | POA: Diagnosis present

## 2018-01-07 MED ORDER — DOXYCYCLINE HYCLATE 100 MG PO CAPS: 100.0000 mg | ORAL_CAPSULE | Freq: Two times a day (BID) | ORAL | 0 refills | Status: AC

## 2018-01-07 NOTE — ED Provider Notes (Signed)
[facility]-EMERGENCY DEPT Provider Note   CSN: [id number] Arrival date & time: 01/07/18  1725     History   Chief Complaint Chief Complaint  Patient presents with  . Abscess    HPI Christopher Berg is a 37 y.o. male.  HPI  Patient is a [age] year old male with a history of asthma, anxiety, chronic neck pain, depression, headache, hep C, kidney stone, UTI who presents emergency department today to be evaluated for possible abscess to the left groin area.  Patient states he has a history of frequent abscesses and skin infections.  He states that about 1 week ago he developed pain, swelling and redness to the area.  States that his symptoms have improved greatly without intervention.  Denies any drainage from the wound.  Denies any fevers or chills.  No other symptoms.  States that he has to wear a harness around his groin area at work every day.  Denies any swelling, pain, redness to the testicles.  Past Medical History:  Diagnosis Date  . Anxiety   . Asthma    "as a child"  . Chronic neck pain   . Depression   . Headache(784.0)    "occas"  . Hepatitis C infection   . Kidney stone    hx of  . Urinary tract infection    hx of    Patient Active Problem List   Diagnosis Date Noted  . Cellulitis of forearm, right 12/02/2014  . Cellulitis and abscess of hand 12/02/2014  . Abscess of left forearm 12/02/2014  . 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 Hepatitis C 07/28/2012  . Abscess in epidural space of cervical spine 06/23/2012  . Cervical discitis 06/23/2012  . Polysubstance abuse (HCC) 06/23/2012  . Nicotine abuse 06/23/2012  . Intractable neck pain with left UE weakness 06/23/2012    Past Surgical History:  Procedure Laterality Date  . ANTERIOR CERVICAL DECOMP/DISCECTOMY FUSION N/A 08/08/2012   Procedure: Incision, drainage and debridement of C5-6 prevertebral and epidural abscess with fusion and left iliac crest bone graft;   Surgeon: Kerrin ChampagneJames E [name], MD;  Location: [facility] OR;  Service: Orthopedics;  Laterality: N/A;  . I&D EXTREMITY Right 12/02/2014   Procedure: IRRIGATION AND DEBRIDEMENT HAND;  Surgeon: Bradly BienenstockFred Ortmann, MD;  Location: [facility] OR;  Service: Orthopedics;  Laterality: Right;  . NO PAST SURGERIES          Home Medications    Prior to Admission medications   Medication Sig Start Date End Date Taking? Authorizing Provider  doxycycline (VIBRAMYCIN) 100 MG capsule Take 1 capsule (100 mg total) by mouth 2 (two) times daily for 5 days. 01/07/18 01/12/18  Brizeyda Holtmeyer S, PA-C  oxyCODONE (OXY IR/ROXICODONE) 5 MG immediate release tablet Take 1 tablet (5 mg total) by mouth once. Patient not taking: Reported on 03/17/2015 12/04/14   Lenell AntuWright, Jamie, MD    Family History History reviewed. No pertinent family history.  Social History Social History   Tobacco Use  . Smoking status: Current Every Day Smoker    Packs/day: 1.00    Types: Cigarettes  . Smokeless tobacco: Never Used  Substance Use Topics  . Alcohol use: Yes    Comment: socially  . Drug use: Yes    Types: Marijuana    Comment: occasionally     Allergies   Patient has no known allergies.   Review of Systems Review of Systems  Constitutional: Negative for fever.  Respiratory: Negative for shortness of breath.   Cardiovascular:  Negative for chest pain.  Gastrointestinal: Negative for abdominal pain and diarrhea.  Musculoskeletal: Negative for back pain.  Skin: Positive for wound.  Neurological: Negative for headaches.     Physical Exam Updated Vital Signs BP (!) 131/92 (BP Location: Right Arm)   Pulse 94   Temp 98.1 F (36.7 C) (Oral)   Resp 16   Ht 6' (1.829 m)   Wt 82.1 kg (181 lb)   SpO2 99%   BMI 24.55 kg/m   Physical Exam  Constitutional: He is oriented to person, place, and time. He appears well-developed and well-nourished. No distress.  Eyes: Conjunctivae are normal.  Cardiovascular: Normal rate and regular rhythm.    Pulmonary/Chest: Effort normal and breath sounds normal.  Abdominal: Soft. There is no tenderness.  Neurological: He is alert and oriented to person, place, and time.  Skin: Skin is warm and dry.  Chaperone present during evaluation.  0.5cm area of induration and erythema to the inferior aspect of the left buttock. No fluctuance or erythema noted. No perirectal or perineal involvement.     ED Treatments / Results  Labs (all labs ordered are listed, but only abnormal results are displayed) Labs Reviewed - No data to display  EKG None  Radiology No results found.  Procedures Procedures (including critical care time)  Medications Ordered in ED Medications - No data to display   Initial Impression / Assessment and Plan / ED Course  I have reviewed the triage vital signs and the nursing notes.  Pertinent labs & imaging results that were available during my care of the patient were reviewed by me and considered in my medical decision making (see chart for details).     Final Clinical Impressions(s) / ED Diagnoses   Final diagnoses:  Skin infection   Patient presenting today with concerns of a possible abscess to his left lower buttock area that has been present for the last week.  He states that redness, swelling and pain have been improving since onset.  Denies any drainage from the area.  No fevers at home.  No other systemic symptoms.  On exam patient has a 0.5 cm area of erythema and induration to the left lower buttock without perirectal or perineal involvement.  There is no obvious evidence of fluctuance on exam.  Will DC home with a course of antibiotics.  Advised warm soaks to the area.  Advised PCP follow-up for reevaluation and to return for any new or worsening symptoms in the meantime.  Patient voices an understanding of the plan and reasons to return immediately to the ED.  All questions answered.  ED Discharge Orders        Ordered    doxycycline (VIBRAMYCIN) 100 MG  capsule  2 times daily     01/07/18 2056       Karrie Meres, PA-C 01/07/18 2057    Little, Ambrose Finland, MD 01/07/18 [phone]

## 2018-01-07 NOTE — ED Triage Notes (Addendum)
Patient reports an abscess to the left groin. Patient has a history of abscesses. patient states the area has decreased and wants oral antibiotics if possible.

## 2018-01-07 NOTE — Discharge Instructions (Addendum)
You were given a prescription for antibiotics. Please take the antibiotic prescription fully.   Please follow up with your primary care provider within 5-7 days for re-evaluation of your symptoms. If you do not have a primary care provider, information for a healthcare clinic has been provided for you to make arrangements for follow up care.  Please return to the emergency room immediately if you experience any new or worsening symptoms or any symptoms that indicate worsening infection such as fevers, increased redness/swelling/pain, warmth, or drainage from the affected area.    

## 2018-07-05 ENCOUNTER — Other Ambulatory Visit: Payer: Self-pay

## 2018-07-05 ENCOUNTER — Inpatient Hospital Stay (HOSPITAL_COMMUNITY)
Admission: EM | Admit: 2018-07-05 | Discharge: 2018-08-02 | DRG: 091 | Disposition: E | Payer: Medicaid Other | Attending: Pulmonary Disease | Admitting: Pulmonary Disease

## 2018-07-05 ENCOUNTER — Emergency Department (HOSPITAL_COMMUNITY): Payer: Medicaid Other

## 2018-07-05 ENCOUNTER — Inpatient Hospital Stay (HOSPITAL_COMMUNITY): Payer: Medicaid Other

## 2018-07-05 ENCOUNTER — Other Ambulatory Visit (HOSPITAL_COMMUNITY): Payer: Self-pay

## 2018-07-05 DIAGNOSIS — G92 Toxic encephalopathy: Principal | ICD-10-CM | POA: Diagnosis present

## 2018-07-05 DIAGNOSIS — Z66 Do not resuscitate: Secondary | ICD-10-CM | POA: Diagnosis not present

## 2018-07-05 DIAGNOSIS — E876 Hypokalemia: Secondary | ICD-10-CM | POA: Diagnosis not present

## 2018-07-05 DIAGNOSIS — Z515 Encounter for palliative care: Secondary | ICD-10-CM | POA: Diagnosis present

## 2018-07-05 DIAGNOSIS — J9600 Acute respiratory failure, unspecified whether with hypoxia or hypercapnia: Secondary | ICD-10-CM | POA: Diagnosis present

## 2018-07-05 DIAGNOSIS — T510X1A Toxic effect of ethanol, accidental (unintentional), initial encounter: Secondary | ICD-10-CM | POA: Diagnosis present

## 2018-07-05 DIAGNOSIS — G931 Anoxic brain damage, not elsewhere classified: Secondary | ICD-10-CM | POA: Diagnosis present

## 2018-07-05 DIAGNOSIS — Z4659 Encounter for fitting and adjustment of other gastrointestinal appliance and device: Secondary | ICD-10-CM

## 2018-07-05 DIAGNOSIS — Y906 Blood alcohol level of 120-199 mg/100 ml: Secondary | ICD-10-CM | POA: Diagnosis present

## 2018-07-05 DIAGNOSIS — J45909 Unspecified asthma, uncomplicated: Secondary | ICD-10-CM | POA: Diagnosis present

## 2018-07-05 DIAGNOSIS — F419 Anxiety disorder, unspecified: Secondary | ICD-10-CM | POA: Diagnosis present

## 2018-07-05 DIAGNOSIS — G40901 Epilepsy, unspecified, not intractable, with status epilepticus: Secondary | ICD-10-CM | POA: Diagnosis present

## 2018-07-05 DIAGNOSIS — I160 Hypertensive urgency: Secondary | ICD-10-CM | POA: Diagnosis present

## 2018-07-05 DIAGNOSIS — G8929 Other chronic pain: Secondary | ICD-10-CM | POA: Diagnosis present

## 2018-07-05 DIAGNOSIS — B182 Chronic viral hepatitis C: Secondary | ICD-10-CM

## 2018-07-05 DIAGNOSIS — E873 Alkalosis: Secondary | ICD-10-CM | POA: Diagnosis present

## 2018-07-05 DIAGNOSIS — F1910 Other psychoactive substance abuse, uncomplicated: Secondary | ICD-10-CM | POA: Diagnosis present

## 2018-07-05 DIAGNOSIS — F1290 Cannabis use, unspecified, uncomplicated: Secondary | ICD-10-CM | POA: Diagnosis present

## 2018-07-05 DIAGNOSIS — J690 Pneumonitis due to inhalation of food and vomit: Secondary | ICD-10-CM | POA: Diagnosis present

## 2018-07-05 DIAGNOSIS — T882XXA Shock due to anesthesia, initial encounter: Secondary | ICD-10-CM | POA: Diagnosis not present

## 2018-07-05 DIAGNOSIS — R69 Illness, unspecified: Secondary | ICD-10-CM

## 2018-07-05 DIAGNOSIS — D72829 Elevated white blood cell count, unspecified: Secondary | ICD-10-CM | POA: Diagnosis not present

## 2018-07-05 DIAGNOSIS — T41295A Adverse effect of other general anesthetics, initial encounter: Secondary | ICD-10-CM | POA: Diagnosis not present

## 2018-07-05 DIAGNOSIS — Z419 Encounter for procedure for purposes other than remedying health state, unspecified: Secondary | ICD-10-CM

## 2018-07-05 DIAGNOSIS — B1920 Unspecified viral hepatitis C without hepatic coma: Secondary | ICD-10-CM | POA: Diagnosis present

## 2018-07-05 DIAGNOSIS — Z529 Donor of unspecified organ or tissue: Secondary | ICD-10-CM

## 2018-07-05 DIAGNOSIS — F329 Major depressive disorder, single episode, unspecified: Secondary | ICD-10-CM | POA: Diagnosis present

## 2018-07-05 DIAGNOSIS — Z87442 Personal history of urinary calculi: Secondary | ICD-10-CM

## 2018-07-05 DIAGNOSIS — T424X1A Poisoning by benzodiazepines, accidental (unintentional), initial encounter: Secondary | ICD-10-CM | POA: Diagnosis present

## 2018-07-05 DIAGNOSIS — J9690 Respiratory failure, unspecified, unspecified whether with hypoxia or hypercapnia: Secondary | ICD-10-CM

## 2018-07-05 DIAGNOSIS — M542 Cervicalgia: Secondary | ICD-10-CM | POA: Diagnosis present

## 2018-07-05 DIAGNOSIS — I469 Cardiac arrest, cause unspecified: Secondary | ICD-10-CM | POA: Diagnosis present

## 2018-07-05 DIAGNOSIS — G253 Myoclonus: Secondary | ICD-10-CM

## 2018-07-05 DIAGNOSIS — Z87440 Personal history of urinary (tract) infections: Secondary | ICD-10-CM

## 2018-07-05 DIAGNOSIS — F17210 Nicotine dependence, cigarettes, uncomplicated: Secondary | ICD-10-CM | POA: Diagnosis present

## 2018-07-05 DIAGNOSIS — G9340 Encephalopathy, unspecified: Secondary | ICD-10-CM

## 2018-07-05 LAB — POCT I-STAT 4, (NA,K, GLUC, HGB,HCT)
Glucose, Bld: 160 mg/dL — ABNORMAL HIGH (ref 70–99)
Glucose, Bld: 129 mg/dL — ABNORMAL HIGH (ref 70–99)
Glucose, Bld: 127 mg/dL — ABNORMAL HIGH (ref 70–99)
HCT: 51 % (ref 39.0–52.0)
HCT: 50 % (ref 39.0–52.0)
HEMATOCRIT: 50 % (ref 39.0–52.0)
Hemoglobin: 17.3 g/dL — ABNORMAL HIGH (ref 13.0–17.0)
Hemoglobin: 17 g/dL (ref 13.0–17.0)
Hemoglobin: 17 g/dL (ref 13.0–17.0)
Potassium: 4 mmol/L (ref 3.5–5.1)
Potassium: 2.9 mmol/L — ABNORMAL LOW (ref 3.5–5.1)
Potassium: 3 mmol/L — ABNORMAL LOW (ref 3.5–5.1)
SODIUM: 142 mmol/L (ref 135–145)
Sodium: 141 mmol/L (ref 135–145)
Sodium: 142 mmol/L (ref 135–145)

## 2018-07-05 LAB — POCT I-STAT 7, (LYTES, BLD GAS, ICA,H+H)
Acid-base deficit: 8 mmol/L — ABNORMAL HIGH (ref 0.0–2.0)
Acid-base deficit: 2 mmol/L (ref 0.0–2.0)
Acid-base deficit: 2 mmol/L (ref 0.0–2.0)
Acid-base deficit: 2 mmol/L (ref 0.0–2.0)
Acid-base deficit: 3 mmol/L — ABNORMAL HIGH (ref 0.0–2.0)
BICARBONATE: 19.5 mmol/L — AB (ref 20.0–28.0)
Bicarbonate: 21.5 mmol/L (ref 20.0–28.0)
Bicarbonate: 25.5 mmol/L (ref 20.0–28.0)
Bicarbonate: 19.9 mmol/L — ABNORMAL LOW (ref 20.0–28.0)
Bicarbonate: 20.3 mmol/L (ref 20.0–28.0)
CALCIUM ION: 1.06 mmol/L — AB (ref 1.15–1.40)
Calcium, Ion: 1.12 mmol/L — ABNORMAL LOW (ref 1.15–1.40)
Calcium, Ion: 1.1 mmol/L — ABNORMAL LOW (ref 1.15–1.40)
Calcium, Ion: 1.06 mmol/L — ABNORMAL LOW (ref 1.15–1.40)
Calcium, Ion: 1.14 mmol/L — ABNORMAL LOW (ref 1.15–1.40)
HCT: 45 % (ref 39.0–52.0)
HCT: 50 % (ref 39.0–52.0)
HCT: 50 % (ref 39.0–52.0)
HCT: 48 % (ref 39.0–52.0)
HEMATOCRIT: 44 % (ref 39.0–52.0)
Hemoglobin: 15.3 g/dL (ref 13.0–17.0)
Hemoglobin: 15 g/dL (ref 13.0–17.0)
Hemoglobin: 17 g/dL (ref 13.0–17.0)
Hemoglobin: 17 g/dL (ref 13.0–17.0)
Hemoglobin: 16.3 g/dL (ref 13.0–17.0)
O2 Saturation: 100 %
O2 Saturation: 100 %
O2 Saturation: 97 %
O2 Saturation: 99 %
O2 Saturation: 99 %
PH ART: 7.341 — AB (ref 7.350–7.450)
PO2 ART: 86 mmHg (ref 83.0–108.0)
Patient temperature: 33.6
Patient temperature: 33
Patient temperature: 33
Potassium: 4.2 mmol/L (ref 3.5–5.1)
Potassium: 4.1 mmol/L (ref 3.5–5.1)
Potassium: 3.3 mmol/L — ABNORMAL LOW (ref 3.5–5.1)
Potassium: 2.9 mmol/L — ABNORMAL LOW (ref 3.5–5.1)
Potassium: 3.4 mmol/L — ABNORMAL LOW (ref 3.5–5.1)
SODIUM: 140 mmol/L (ref 135–145)
Sodium: 142 mmol/L (ref 135–145)
Sodium: 142 mmol/L (ref 135–145)
Sodium: 142 mmol/L (ref 135–145)
Sodium: 141 mmol/L (ref 135–145)
TCO2: 23 mmol/L (ref 22–32)
TCO2: 27 mmol/L (ref 22–32)
TCO2: 21 mmol/L — ABNORMAL LOW (ref 22–32)
TCO2: 21 mmol/L — ABNORMAL LOW (ref 22–32)
TCO2: 20 mmol/L — ABNORMAL LOW (ref 22–32)
pCO2 arterial: 59.1 mmHg — ABNORMAL HIGH (ref 32.0–48.0)
pCO2 arterial: 45.3 mmHg (ref 32.0–48.0)
pCO2 arterial: 23.9 mmHg — ABNORMAL LOW (ref 32.0–48.0)
pCO2 arterial: 23.6 mmHg — ABNORMAL LOW (ref 32.0–48.0)
pCO2 arterial: 27.4 mmHg — ABNORMAL LOW (ref 32.0–48.0)
pH, Arterial: 7.17 — CL (ref 7.350–7.450)
pH, Arterial: 7.513 — ABNORMAL HIGH (ref 7.350–7.450)
pH, Arterial: 7.526 — ABNORMAL HIGH (ref 7.350–7.450)
pH, Arterial: 7.46 — ABNORMAL HIGH (ref 7.350–7.450)
pO2, Arterial: 280 mmHg — ABNORMAL HIGH (ref 83.0–108.0)
pO2, Arterial: 541 mmHg — ABNORMAL HIGH (ref 83.0–108.0)
pO2, Arterial: 62 mmHg — ABNORMAL LOW (ref 83.0–108.0)
pO2, Arterial: 114 mmHg — ABNORMAL HIGH (ref 83.0–108.0)

## 2018-07-05 LAB — PROTIME-INR
INR: 1.03
INR: 0.91
Prothrombin Time: 13.4 seconds (ref 11.4–15.2)
Prothrombin Time: 12.2 seconds (ref 11.4–15.2)

## 2018-07-05 LAB — BASIC METABOLIC PANEL
Anion gap: 9 (ref 5–15)
BUN: 9 mg/dL (ref 6–20)
CO2: 26 mmol/L (ref 22–32)
Calcium: 8.1 mg/dL — ABNORMAL LOW (ref 8.9–10.3)
Chloride: 111 mmol/L (ref 98–111)
Creatinine, Ser: 0.75 mg/dL (ref 0.61–1.24)
GFR calc Af Amer: >60 mL/min (ref >60)
GFR calc non Af Amer: >60 mL/min (ref >60)
Glucose, Bld: 95 mg/dL (ref 70–99)
Potassium: 4.6 mmol/L (ref 3.5–5.1)
Sodium: 146 mmol/L — ABNORMAL HIGH (ref 135–145)

## 2018-07-05 LAB — CBC
HCT: 48.8 % (ref 39.0–52.0)
Hemoglobin: 15.7 g/dL (ref 13.0–17.0)
MCH: 30.1 pg (ref 26.0–34.0)
MCHC: 32.2 g/dL (ref 30.0–36.0)
MCV: 93.5 fL (ref 80.0–100.0)
NRBC: 0 % (ref 0.0–0.2)
Platelets: 180 10*3/uL (ref 150–400)
RBC: 5.22 MIL/uL (ref 4.22–5.81)
RDW: 12.7 % (ref 11.5–15.5)
WBC: 12.5 10*3/uL — ABNORMAL HIGH (ref 4.0–10.5)

## 2018-07-05 LAB — GLUCOSE, CAPILLARY
Glucose-Capillary: 96 mg/dL (ref 70–99)
Glucose-Capillary: 141 mg/dL — ABNORMAL HIGH (ref 70–99)
Glucose-Capillary: 94 mg/dL (ref 70–99)
Glucose-Capillary: 91 mg/dL (ref 70–99)
Glucose-Capillary: 104 mg/dL — ABNORMAL HIGH (ref 70–99)
Glucose-Capillary: 102 mg/dL — ABNORMAL HIGH (ref 70–99)

## 2018-07-05 LAB — CBC WITH DIFFERENTIAL/PLATELET
Abs Immature Granulocytes: 0.64 10*3/uL — ABNORMAL HIGH (ref 0.00–0.07)
BASOS ABS: 0.1 10*3/uL (ref 0.0–0.1)
BASOS PCT: 1 %
EOS PCT: 1 %
Eosinophils Absolute: 0.1 10*3/uL (ref 0.0–0.5)
HEMATOCRIT: 45.1 % (ref 39.0–52.0)
Hemoglobin: 14.6 g/dL (ref 13.0–17.0)
IMMATURE GRANULOCYTES: 4 %
Lymphocytes Relative: 33 %
Lymphs Abs: 5.2 10*3/uL — ABNORMAL HIGH (ref 0.7–4.0)
MCH: 30.6 pg (ref 26.0–34.0)
MCHC: 32.4 g/dL (ref 30.0–36.0)
MCV: 94.5 fL (ref 80.0–100.0)
Monocytes Absolute: 0.9 10*3/uL (ref 0.1–1.0)
Monocytes Relative: 6 %
NEUTROS PCT: 55 %
Neutro Abs: 8.8 10*3/uL — ABNORMAL HIGH (ref 1.7–7.7)
PLATELETS: 206 10*3/uL (ref 150–400)
RBC: 4.77 MIL/uL (ref 4.22–5.81)
RDW: 12.6 % (ref 11.5–15.5)
WBC: 15.8 10*3/uL — AB (ref 4.0–10.5)
nRBC: 0.1 % (ref 0.0–0.2)

## 2018-07-05 LAB — MRSA PCR SCREENING

## 2018-07-05 LAB — PHOSPHORUS: Phosphorus: 4.3 mg/dL (ref 2.5–4.6)

## 2018-07-05 LAB — RAPID URINE DRUG SCREEN, HOSP PERFORMED
AMPHETAMINES: NOT DETECTED
BENZODIAZEPINES: POSITIVE — AB
Barbiturates: NOT DETECTED
Cocaine: NOT DETECTED
OPIATES: NOT DETECTED
TETRAHYDROCANNABINOL: NOT DETECTED

## 2018-07-05 LAB — COMPREHENSIVE METABOLIC PANEL
ALBUMIN: 3.4 g/dL — AB (ref 3.5–5.0)
ALT: 89 U/L — ABNORMAL HIGH (ref 0–44)
ANION GAP: 17 — AB (ref 5–15)
AST: 107 U/L — AB (ref 15–41)
Alkaline Phosphatase: 57 U/L (ref 38–126)
BUN: 11 mg/dL (ref 6–20)
CHLORIDE: 110 mmol/L (ref 98–111)
CO2: 15 mmol/L — ABNORMAL LOW (ref 22–32)
Calcium: 7.7 mg/dL — ABNORMAL LOW (ref 8.9–10.3)
Creatinine, Ser: 1.05 mg/dL (ref 0.61–1.24)
GFR calc Af Amer: >60 mL/min (ref >60)
GFR calc non Af Amer: >60 mL/min (ref >60)
GLUCOSE: 273 mg/dL — AB (ref 70–99)
POTASSIUM: 3.9 mmol/L (ref 3.5–5.1)
Sodium: 142 mmol/L (ref 135–145)
Total Bilirubin: 0.8 mg/dL (ref 0.3–1.2)
Total Protein: 6.4 g/dL — ABNORMAL LOW (ref 6.5–8.1)

## 2018-07-05 LAB — ACETAMINOPHEN LEVEL: Acetaminophen (Tylenol), Serum: <10 ug/mL — ABNORMAL LOW (ref 10–30)

## 2018-07-05 LAB — CBG MONITORING, ED: GLUCOSE-CAPILLARY: 229 mg/dL — AB (ref 70–99)

## 2018-07-05 LAB — TROPONIN I
Troponin I: <0.03 ng/mL (ref <0.03)
Troponin I: 0.03 ng/mL (ref <0.03)
Troponin I: <0.03 ng/mL (ref <0.03)
Troponin I: <0.03 ng/mL (ref <0.03)

## 2018-07-05 LAB — APTT
aPTT: 28 seconds (ref 24–36)
aPTT: 30 seconds (ref 24–36)

## 2018-07-05 LAB — MAGNESIUM: Magnesium: 2.1 mg/dL (ref 1.7–2.4)

## 2018-07-05 LAB — SALICYLATE LEVEL: Salicylate Lvl: <7 mg/dL (ref 2.8–30.0)

## 2018-07-05 LAB — ETHANOL: ALCOHOL ETHYL (B): 143 mg/dL — AB (ref <10)

## 2018-07-05 MED ORDER — HYDRALAZINE HCL 20 MG/ML IJ SOLN
10.0000 mg | INTRAMUSCULAR | Status: DC | PRN
  Administered 2018-07-05 – 2018-07-09 (×3): 10 mg via INTRAVENOUS
  Filled 2018-07-05 (×3): qty 1

## 2018-07-05 MED ORDER — NALOXONE HCL 2 MG/2ML IJ SOSY
PREFILLED_SYRINGE | INTRAMUSCULAR | Status: AC | PRN
  Administered 2018-07-05: 2 mg via INTRAVENOUS

## 2018-07-05 MED ORDER — SUCCINYLCHOLINE CHLORIDE 20 MG/ML IJ SOLN
INTRAMUSCULAR | Status: AC | PRN
  Administered 2018-07-05: 100 mg via INTRAVENOUS

## 2018-07-05 MED ORDER — POTASSIUM CHLORIDE 20 MEQ/15ML (10%) PO SOLN
40.0000 meq | Freq: Once | ORAL | Status: AC
  Administered 2018-07-05: 40 meq
  Filled 2018-07-05: qty 30

## 2018-07-05 MED ORDER — SODIUM CHLORIDE 0.9 % IV SOLN
1.0000 ug/kg/min | INTRAVENOUS | Status: DC
  Administered 2018-07-05 – 2018-07-06 (×2): 1 ug/kg/min via INTRAVENOUS
  Filled 2018-07-05 (×2): qty 20

## 2018-07-05 MED ORDER — SODIUM CHLORIDE 0.9 % IV BOLUS: 1000.0000 mL | Freq: Once | INTRAVENOUS | Status: DC

## 2018-07-05 MED ORDER — HYDRALAZINE HCL 20 MG/ML IJ SOLN: 10.0000 mg | INTRAMUSCULAR | Status: DC | PRN

## 2018-07-05 MED ORDER — FENTANYL CITRATE (PF) 100 MCG/2ML IJ SOLN: 100.0000 ug | Freq: Once | INTRAMUSCULAR | Status: DC

## 2018-07-05 MED ORDER — PROPOFOL 1000 MG/100ML IV EMUL
25.0000 ug/kg/min | INTRAVENOUS | Status: DC
  Administered 2018-07-05: 60 ug/kg/min via INTRAVENOUS
  Filled 2018-07-05: qty 100

## 2018-07-05 MED ORDER — MIDAZOLAM 50MG/50ML (1MG/ML) PREMIX INFUSION
0.0000 mg/h | INTRAVENOUS | Status: DC
  Administered 2018-07-05: 4 mg/h via INTRAVENOUS
  Administered 2018-07-05: 2 mg/h via INTRAVENOUS
  Administered 2018-07-05: 5 mg/h via INTRAVENOUS
  Administered 2018-07-06: 8 mg/h via INTRAVENOUS
  Administered 2018-07-06: 6 mg/h via INTRAVENOUS
  Administered 2018-07-06: 5 mg/h via INTRAVENOUS
  Administered 2018-07-07 (×3): 8 mg/h via INTRAVENOUS
  Administered 2018-07-07: 4 mg/h via INTRAVENOUS
  Administered 2018-07-08: 6 mg/h via INTRAVENOUS
  Filled 2018-07-05 (×11): qty 50

## 2018-07-05 MED ORDER — CHLORHEXIDINE GLUCONATE 0.12% ORAL RINSE (MEDLINE KIT)
15.0000 mL | Freq: Two times a day (BID) | OROMUCOSAL | Status: DC
  Administered 2018-07-05 – 2018-07-09 (×9): 15 mL via OROMUCOSAL

## 2018-07-05 MED ORDER — ETOMIDATE 2 MG/ML IV SOLN
INTRAVENOUS | Status: AC | PRN
  Administered 2018-07-05: 20 mg via INTRAVENOUS

## 2018-07-05 MED ORDER — CISATRACURIUM BOLUS VIA INFUSION
0.0500 mg/kg | INTRAVENOUS | Status: DC | PRN
  Filled 2018-07-05: qty 5

## 2018-07-05 MED ORDER — FENTANYL 2500MCG IN NS 250ML (10MCG/ML) PREMIX INFUSION
100.0000 ug/h | INTRAVENOUS | Status: DC
  Administered 2018-07-05: 400 ug/h via INTRAVENOUS
  Administered 2018-07-05: 100 ug/h via INTRAVENOUS
  Administered 2018-07-05 – 2018-07-06 (×4): 400 ug/h via INTRAVENOUS
  Filled 2018-07-05 (×8): qty 250

## 2018-07-05 MED ORDER — CISATRACURIUM BOLUS VIA INFUSION
0.1000 mg/kg | Freq: Once | INTRAVENOUS | Status: DC
  Filled 2018-07-05: qty 9

## 2018-07-05 MED ORDER — POTASSIUM CHLORIDE 20 MEQ/15ML (10%) PO SOLN
40.0000 meq | Freq: Once | ORAL | Status: AC
  Administered 2018-07-05: 40 meq via ORAL
  Filled 2018-07-05: qty 30

## 2018-07-05 MED ORDER — NOREPINEPHRINE-SODIUM CHLORIDE 4-0.9 MG/250ML-% IV SOLN
0.0000 ug/min | INTRAVENOUS | Status: DC
  Filled 2018-07-05 (×2): qty 250

## 2018-07-05 MED ORDER — SODIUM CHLORIDE 0.9 % IV BOLUS: 500.0000 mL | Freq: Once | INTRAVENOUS | Status: DC

## 2018-07-05 MED ORDER — FAMOTIDINE IN NACL 20-0.9 MG/50ML-% IV SOLN
20.0000 mg | Freq: Two times a day (BID) | INTRAVENOUS | Status: DC
  Administered 2018-07-05 – 2018-07-09 (×9): 20 mg via INTRAVENOUS
  Filled 2018-07-05 (×9): qty 50

## 2018-07-05 MED ORDER — LEVETIRACETAM IN NACL 1000 MG/100ML IV SOLN
1000.0000 mg | Freq: Two times a day (BID) | INTRAVENOUS | Status: DC
  Administered 2018-07-05 – 2018-07-09 (×8): 1000 mg via INTRAVENOUS
  Filled 2018-07-05 (×9): qty 100

## 2018-07-05 MED ORDER — MIDAZOLAM HCL 2 MG/2ML IJ SOLN
INTRAMUSCULAR | Status: AC
  Administered 2018-07-05: 4 mg via INTRAVENOUS
  Filled 2018-07-05: qty 4

## 2018-07-05 MED ORDER — MIDAZOLAM HCL 2 MG/2ML IJ SOLN
2.0000 mg | INTRAMUSCULAR | Status: DC | PRN
  Administered 2018-07-05 – 2018-07-08 (×3): 2 mg via INTRAVENOUS
  Filled 2018-07-05: qty 2

## 2018-07-05 MED ORDER — ARTIFICIAL TEARS OPHTHALMIC OINT
1.0000 "application " | TOPICAL_OINTMENT | Freq: Three times a day (TID) | OPHTHALMIC | Status: DC
  Administered 2018-07-05 – 2018-07-07 (×7): 1 via OPHTHALMIC
  Filled 2018-07-05: qty 3.5

## 2018-07-05 MED ORDER — SODIUM CHLORIDE 0.9 % IV SOLN: INTRAVENOUS | Status: DC | PRN

## 2018-07-05 MED ORDER — MIDAZOLAM HCL 2 MG/2ML IJ SOLN
4.0000 mg | Freq: Once | INTRAMUSCULAR | Status: AC
  Administered 2018-07-05: 4 mg via INTRAVENOUS
  Filled 2018-07-05: qty 4

## 2018-07-05 MED ORDER — ASPIRIN 300 MG RE SUPP: 300.0000 mg | RECTAL | Status: AC

## 2018-07-05 MED ORDER — ORAL CARE MOUTH RINSE
15.0000 mL | OROMUCOSAL | Status: DC
  Administered 2018-07-05 – 2018-07-09 (×39): 15 mL via OROMUCOSAL

## 2018-07-05 MED ORDER — INSULIN ASPART 100 UNIT/ML ~~LOC~~ SOLN
0.0000 [IU] | SUBCUTANEOUS | Status: DC
  Administered 2018-07-05 – 2018-07-06 (×3): 1 [IU] via SUBCUTANEOUS
  Administered 2018-07-08: 2 [IU] via SUBCUTANEOUS
  Administered 2018-07-08 (×2): 1 [IU] via SUBCUTANEOUS

## 2018-07-05 MED ORDER — ENOXAPARIN SODIUM 40 MG/0.4ML ~~LOC~~ SOLN
40.0000 mg | SUBCUTANEOUS | Status: DC
  Administered 2018-07-05 – 2018-07-08 (×4): 40 mg via SUBCUTANEOUS
  Filled 2018-07-05 (×4): qty 0.4

## 2018-07-05 MED ORDER — FENTANYL BOLUS VIA INFUSION
50.0000 ug | INTRAVENOUS | Status: DC | PRN
  Filled 2018-07-05: qty 50

## 2018-07-05 MED ORDER — CLEVIDIPINE BUTYRATE 0.5 MG/ML IV EMUL
0.0000 mg/h | INTRAVENOUS | Status: DC
  Administered 2018-07-05: 1 mg/h via INTRAVENOUS
  Administered 2018-07-05: 6 mg/h via INTRAVENOUS
  Administered 2018-07-05: 1 mg/h via INTRAVENOUS
  Administered 2018-07-06: 2 mg/h via INTRAVENOUS
  Filled 2018-07-05 (×5): qty 50

## 2018-07-05 MED ORDER — MIDAZOLAM HCL (PF) 5 MG/ML IJ SOLN: 4.0000 mg | Freq: Once | INTRAMUSCULAR | Status: DC

## 2018-07-05 MED ORDER — PROPOFOL 1000 MG/100ML IV EMUL
5.0000 ug/kg/min | INTRAVENOUS | Status: DC
Start: 2018-07-05 — End: 2018-07-05
  Administered 2018-07-05: 10 ug/kg/min via INTRAVENOUS

## 2018-07-05 MED ORDER — HEPARIN SODIUM (PORCINE) 5000 UNIT/ML IJ SOLN: 5000.0000 [IU] | Freq: Three times a day (TID) | INTRAMUSCULAR | Status: DC

## 2018-07-05 NOTE — Progress Notes (Signed)
NAME:  Christopher Berg, MRN:  [id number], DOB:  [birth year], LOS: 0 ADMISSION DATE:  11-Jul-2018, CONSULTATION DATE:  07/11/18 REFERRING MD:  ED attending , CHIEF COMPLAINT:  Cardiac arrest    Brief History   38 year old male with history significant for hepatitis C, and IV drug use who presents after being found unresponsive in cardiac arrest.  Had initial CPR for 10 minutes with 2 rounds of epi with [place].  Later during EMS transport to the hospital had another cardiac arrest with CPR for 5 minutes and received 2 rounds of epi.  History of present illness   38 year old male history significant for daily C and IV drug use who was brought to the hospital by EMS.  History is obtained from chart review and from the ED nurse.  Apparently patient was doing drugs in a car with his friend.  At one point his friend noted that the patient was unresponsive pale.  Then called EMS.  Unclear how long he was unresponsive before EMS was called.  When EMS got out there he was given intranasal Narcan without improvement in his mental status.  He was found to be in cardiac arrest.  Unclear what initial rhythm was.  He had CPR for 10 minutes with 2 rounds of epinephrine and he achieved [facility].  A King airway was placed in the field.  During transport by EMS he had another cardiac arrest.  CPR done for 5 minutes with 2 rounds of epi and Rusk was achieved.  Patient got to the ED and was completely unresponsive.  King tube was exchanged for endotracheal tube but there was some difficulties with intubation.  He was given etomidate and succinylcholine for intubation.  His friend who he lives with came in later to see him.  They state that patient has longstanding history of IV drug use.  He was in jail for 35 days and got out 2 weeks ago.  According to them he had not done any drugs in the past 2 weeks until tonight.  They state that they saw him at 1030 and he must have left the house to go to drugs.  He has not been having any  other symptoms or thing off.   Past Medical History  -IVDU -Hepatitis C  Significant Hospital Events   -Hypothermia protocol -Intubation   Consults:  -Neurology consult   Procedures:  -Intubation 2/2   Significant Diagnostic Tests:  -CXR-Normal -CT head -neg acute   Micro Data:  -None   Antimicrobials:  -None   Interim history/subjective:  Sedated on vent , TTM w/ rewarming at  B/p remains elevated despite hydralazine  Friends at bedside , mother on way will be here by 6 pm  Neuro following , EEG with burst suppression     Objective   Blood pressure (!) 194/103, pulse 63, temperature (!) 90.7 F (32.6 C), temperature source Bladder, resp. rate (!) 26, height 6' (1.829 m), weight 82.1 kg, SpO2 99 %.    Vent Mode: PRVC FiO2 (%):  [40 %-100 %] 40 % Set Rate:  [20 bmp-26 bmp] 26 bmp Vt Set:  [580 mL-620 mL] 620 mL PEEP:  [5 cmH20] 5 cmH20 Plateau Pressure:  [18 cmH20] 18 cmH20   Intake/Output Summary (Last 24 hours) at 11-Jul-2018 0833 Last data filed at 07-11-2018 0800 Gross per 24 hour  Intake 219.32 ml  Output 1150 ml  Net -930.68 ml   Filed Weights   11-Jul-2018 0042 Jul 11, 2018 0050  Weight: 82 kg 82.1  kg    Examination: General: Adult male sedated on ventilator unresponsive on paralytics HENT: ETT and OGT in place Lungs: Clear to auscultation bilaterally  Cardiovascular: RRR no MRG  Abdomen: Abdomen soft hypoactive bowel sounds Extremities: No edema  Neuro: Unresponsive, sedated and on paralytics . GU: Foley  Resolved Hospital Problem list   -N/A  Assessment & Plan:  #Cardiac arrest  #Respiratory failure on mechanical ventilation Patient found to be cardiac arrest after he was in his car doing drugs.  He was given Narcan without good response.  And ACLS for a total of 15 minutes. He has not had any neurological response since arrival to the hospital. Suspect that his cardiac arrest might of started off as a respiratory arrest. -Continue vent  support -decrease rate to 20 4 repeat ABG in 1 hour -Targeted temperature management for 24 hours -Sedation fentanyl and Versed -Nimbex for paralysis per protocol -Lung protective ventilation strategies -VAP Prevention protocol  Hypertensive urgency Unresponsive to hydralazine  Begin Cleviprex , use with caution to prevent hypotension   #Acute encephalopathy #Myoclonic jerks  EEG 2/2 burst suppression, CT head 2/2 no acute process Patient with acute encephalopathy and unresponsiveness since arrival to the ED.  He has also developed myoclonic jerks.  This is concerning for hypoxic brain injury. -Neurology followin -Continue Keppra -CT head is pending -Replete electrolytes as indicated -Replace potassium  #IVDU #Hepatitis C - hepatitis panel pending -UDS pending -Substance abuse counseling if patient wakes up and gets extubated  Best practice:  Diet: NPO once off paralytics begin tube feeds  Pain/Anxiety/Delirium protocol (if indicated): Fentanyl and Versed VAP protocol (if indicated): HOB elevation, oral care  DVT prophylaxis: Lovenox  GI prophylaxis: famotidine  Glucose control: maintain euglycemia /sliding scale Mobility: PT/OT as appropriate  Code Status: Full code  Family Communication: Updated friends, his brother Sharia ReeveJosh has a sick child at home and isnt able to come in and his mother [place] in [place] she is on her way here Disposition:  ICU  Labs   CBC: Recent Labs  Lab 07/08/2018 0052 07/27/2018 0210 07/31/2018 0353 07/13/2018 0410 07/27/2018 0629 07/06/2018 0738  WBC 15.8*  --  12.5*  --   --   --   NEUTROABS 8.8*  --   --   --   --   --   HGB 14.6 15.3 15.7 15.0 17.3* 17.0  HCT 45.1 45.0 48.8 44.0 51.0 50.0  MCV 94.5  --  93.5  --   --   --   PLT 206  --  180  --   --   --     Basic Metabolic Panel: Recent Labs  Lab 07/22/2018 0052 07/07/2018 0210 07/04/2018 0353 07/15/2018 0410 07/12/2018 0629 07/06/2018 0738  NA 142 142 146* 142 141 140  K 3.9 4.2 4.6 4.1 4.0 3.3*    CL 110  --  111  --   --   --   CO2 15*  --  26  --   --   --   GLUCOSE 273*  --  95  --  160*  --   BUN 11  --  9  --   --   --   CREATININE 1.05  --  0.75  --   --   --   CALCIUM 7.7*  --  8.1*  --   --   --   MG  --   --  2.1  --   --   --   PHOS  --   --  4.3  --   --   --    GFR: Estimated Creatinine Clearance: 140.1 mL/min (by C-G formula based on SCr of 0.75 mg/dL). Recent Labs  Lab 07/18/2018 0052 07/17/2018 0353  WBC 15.8* 12.5*    Liver Function Tests: Recent Labs  Lab 07/25/2018 0052  AST 107*  ALT 89*  ALKPHOS 57  BILITOT 0.8  PROT 6.4*  ALBUMIN 3.4*   ABG    Component Value Date/Time   PHART 7.513 (H) 07/23/2018 0738   PCO2ART 23.9 (L) 07/21/2018 0738   PO2ART 62.0 (L) 07/17/2018 0738   HCO3 19.9 (L) 07/28/2018 0738   TCO2 21 (L) 07/28/2018 0738   ACIDBASEDEF 2.0 07/07/2018 0738   O2SAT 97.0 07/18/2018 0738     Coagulation Profile: Recent Labs  Lab 07/06/2018 0210  INR 1.03    Cardiac Enzymes: Recent Labs  Lab 07/23/2018 0210 07/04/2018 0353  TROPONINI <0.03 0.03*    HbA1C: Hgb A1c MFr Bld  Date/Time Value Ref Range Status  12/02/2014 03:04 PM 5.6 4.8 - 5.6 % Final    Comment:    (NOTE)         Pre-diabetes: 5.7 - 6.4         Diabetes: >6.4         Glycemic control for adults with diabetes: <7.0     CBG: Recent Labs  Lab 07/29/2018 0028 07/06/2018 0420 07/12/2018 0737  GLUCAP 229* 96 141*    Review of Systems:   Unable to obtain due to patient being intubated and his mental status   Past Medical History  He,  has a past medical history of Anxiety, Asthma, Chronic neck pain, Depression, Headache(784.0), Hepatitis C infection, Kidney stone, and Urinary tract infection.   Surgical History    Past Surgical History:  Procedure Laterality Date  . ANTERIOR CERVICAL DECOMP/DISCECTOMY FUSION N/A 08/08/2012   Procedure: Incision, drainage and debridement of C5-6 prevertebral and epidural abscess with fusion and left iliac crest bone graft;   Surgeon: Kerrin ChampagneJames E [name], MD;  Location: [facility] OR;  Service: Orthopedics;  Laterality: N/A;  . I&D EXTREMITY Right 12/02/2014   Procedure: IRRIGATION AND DEBRIDEMENT HAND;  Surgeon: Bradly BienenstockFred Ortmann, MD;  Location: [facility] OR;  Service: Orthopedics;  Laterality: Right;  . NO PAST SURGERIES       Social History   reports that he has been smoking cigarettes. He has been smoking about 1.00 pack per day. He has never used smokeless tobacco. He reports current alcohol use. He reports current drug use. Drug: Marijuana.   Family History   His family history is not on file.   Allergies No Known Allergies   Home Medications  Prior to Admission medications   Medication Sig Start Date End Date Taking? Authorizing Provider  oxyCODONE (OXY IR/ROXICODONE) 5 MG immediate release tablet Take 1 tablet (5 mg total) by mouth once. Patient not taking: Reported on 03/17/2015 12/04/14   Lenell AntuWright, Jamie, MD    Sanav Remer NP-C  [facility]  [phone]  07/26/2018

## 2018-07-05 NOTE — Progress Notes (Signed)
EEG complete - results pending 

## 2018-07-05 NOTE — Procedures (Addendum)
Arterial Catheter Insertion Procedure Note Christopher Berg [id number] [birth year]  Procedure: Insertion of Arterial Catheter  Indications: Blood pressure monitoring  Procedure Details Consent: Risks of procedure as well as the alternatives and risks of each were explained to the (patient/caregiver).  Consent for procedure obtained. Time Out: Verified patient identification, verified procedure, site/side was marked, verified correct patient position, special equipment/implants available, medications/allergies/relevent history reviewed, required imaging and test results available.  Performed  Maximum sterile technique was used including cap, gloves, gown, hand hygiene, mask and sheet. Skin prep: Chlorhexidine; local anesthetic administered 20 gauge catheter was inserted into right radial artery using the Seldinger technique.  Evaluation Blood flow good; BP tracing good. Complications: No apparent complications. Placed by Stephani Police RRT, RCP   Christopher Berg 07/30/2018

## 2018-07-05 NOTE — Procedures (Signed)
Arterial Catheter Insertion Procedure Note Christopher Berg [id number] [birth year]  Procedure: Insertion of Arterial Catheter  Indications: Blood pressure monitoring  Procedure Details Consent: Unable to obtain consent because of altered level of consciousness. Time Out: Verified patient identification, verified procedure, site/side was marked, verified correct patient position, special equipment/implants available, medications/allergies/relevent history reviewed, required imaging and test results available.  Performed  Maximum sterile technique was used including antiseptics, cap, gloves, gown, hand hygiene, mask and sheet. Skin prep: Chlorhexidine; local anesthetic administered 20 gauge catheter was inserted into right radial artery using the Seldinger technique. ULTRASOUND GUIDANCE USED: NO Evaluation Blood flow good; BP tracing good. Complications: No apparent complications.   Christopher Berg 07/12/2018

## 2018-07-05 NOTE — Progress Notes (Signed)
eLink Physician-Brief Progress Note Patient Name: Christopher Berg DOB: 08-02-1980 MRN: [id number]   Date of Service  07/29/2018  HPI/Events of Note  Hypertension - BP = 192/104 and HR = 45. Propofol IV infusion at ceiling and Herniation (less likely given CT Scan results) vs inadequate sedation with NMB. Bradycardia likely due to Propofol IV infusion. Head CT Scan reveals no intraparenchymal hemorrhage, mass effect or midline shift.   eICU Interventions  Will order: 1. D/C Propofol IV infusion. 2. Increase ceiling on Fentanyl IV infusion to 400 mcg/hour. 3. Versed 2 mg IV Q 1 hour PRN.  4. Please BIS monitor patient.  5. Hydralazine 10 mg IV Q 4 hours PRN SBP > 170 or DBP > 100.         Jemaine Prokop Dennard Nip 07/04/2018, 4:56 AM

## 2018-07-05 NOTE — ED Provider Notes (Signed)
[facility] EMERGENCY DEPARTMENT Provider Note   CSN: [id number] Arrival date & time: 07/14/2018  0022     History   Chief Complaint Chief Complaint  Patient presents with  . Post CPR    HPI Christopher Berg is a 38 y.o. male.  Patient presents to the emergency department for evaluation of cardiopulmonary arrest.  He is brought to the ER by EMS.  Patient was reportedly sitting in a car with a friend when he slowly became obtunded.  Friend reports that the patient has a history of drug abuse.  Friend reported that the patient seemed like he ate had done some drugs prior to meeting up with him, but denies seeing him do any drugs.  Patient became more more sleepy and then was unarousable.  First responders administered Narcan without any effect.  CPR was initiated by fire.  EMS reports that upon their arrival patient was in PEA.  CPR was performed and he was given multiple doses of epi.  Total time was 10 minutes before return of spontaneous circulation.  EMS reports that they did lose pulses again briefly requiring 5 more minutes of CPR and 2 more doses of epi, regained pulses and has been in sinus tachycardia throughout the remainder of transport. Level V Caveat due to acuity.     Past Medical History:  Diagnosis Date  . Anxiety   . Asthma    "as a child"  . Chronic neck pain   . Depression   . Headache(784.0)    "occas"  . Hepatitis C infection   . Kidney stone    hx of  . Urinary tract infection    hx of    Patient Active Problem List   Diagnosis Date Noted  . Cardiac arrest (HCC) 07/31/2018  . Cellulitis of forearm, right 12/02/2014  . Cellulitis and abscess of hand 12/02/2014  . Abscess of left forearm 12/02/2014  . 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 Hepatitis C 07/28/2012  . Abscess in epidural space of cervical spine 06/23/2012  . Cervical discitis 06/23/2012  . Polysubstance abuse (HCC) 06/23/2012  . Nicotine abuse  06/23/2012  . Intractable neck pain with left UE weakness 06/23/2012    Past Surgical History:  Procedure Laterality Date  . 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 I&D EXTREMITY Right 12/02/2014   Procedure: IRRIGATION AND DEBRIDEMENT HAND;  Surgeon: Bradly Bienenstock, MD;  Location: [facility] OR;  Service: Orthopedics;  Laterality: Right;  . NO PAST SURGERIES          Home Medications    Prior to Admission medications   Medication Sig Start Date End Date Taking? Authorizing Provider  oxyCODONE (OXY IR/ROXICODONE) 5 MG immediate release tablet Take 1 tablet (5 mg total) by mouth once. Patient not taking: Reported on 03/17/2015 12/04/14   Lenell Antu, MD    Family History No family history on file.  Social History Social History   Tobacco Use  . Smoking status: Current Every Day Smoker    Packs/day: 1.00    Types: Cigarettes  . Smokeless tobacco: Never Used  Substance Use Topics  . Alcohol use: Yes    Comment: socially  . Drug use: Yes    Types: Marijuana    Comment: occasionally  Allergies   Patient has no known allergies.   Review of Systems Review of Systems  Unable to perform ROS: Acuity of condition     Physical Exam Updated Vital Signs BP (!) 124/105   Pulse (!) 102   Resp 19   Ht 6' (1.829 m)   Wt 82.1 kg   SpO2 100%   BMI 24.55 kg/m   Physical Exam Vitals signs and nursing note reviewed.  Constitutional:      General: He is in acute distress.  HENT:     Head: Atraumatic.  Eyes:     Pupils: Pupils are equal, round, and reactive to light.  Neck:     Musculoskeletal: Neck supple.  Cardiovascular:     Rate and Rhythm: Regular rhythm. Tachycardia present.     Heart sounds: Normal heart sounds.  Pulmonary:      Breath sounds: Normal breath sounds.  Abdominal:     General: There is distension.  Musculoskeletal:        General: No deformity.  Skin:    General: Skin is warm and dry.     Capillary Refill: Capillary refill takes less than 2 seconds.  Neurological:     Comments: unresponsive      ED Treatments / Results  Labs (all labs ordered are listed, but only abnormal results are displayed) Labs Reviewed  CBC WITH DIFFERENTIAL/PLATELET - Abnormal; Notable for the following components:      Result Value   WBC 15.8 (*)    Neutro Abs 8.8 (*)    Lymphs Abs 5.2 (*)    Abs Immature Granulocytes 0.64 (*)    All other components within normal limits  COMPREHENSIVE METABOLIC PANEL - Abnormal; Notable for the following components:   CO2 15 (*)    Glucose, Bld 273 (*)    Calcium 7.7 (*)    Total Protein 6.4 (*)    Albumin 3.4 (*)    AST 107 (*)    ALT 89 (*)    Anion gap 17 (*)    All other components within normal limits  ETHANOL - Abnormal; Notable for the following components:   Alcohol, Ethyl (B) 143 (*)    All other components within normal limits  ACETAMINOPHEN LEVEL - Abnormal; Notable for the following components:   Acetaminophen (Tylenol), Serum <10 (*)    All other components within normal limits  CBG MONITORING, ED - Abnormal; Notable for the following components:   Glucose-Capillary 229 (*)    All other components within normal limits  SALICYLATE LEVEL  RAPID URINE DRUG SCREEN, HOSP PERFORMED  TROPONIN I  TROPONIN I  TROPONIN I  TROPONIN I  PROTIME-INR  PROTIME-INR  APTT  APTT  BLOOD GAS, ARTERIAL  BLOOD GAS, ARTERIAL  CBC  BASIC METABOLIC PANEL  MAGNESIUM  PHOSPHORUS  I-STAT ARTERIAL BLOOD GAS, ED    EKG EKG Interpretation  Date/Time:  Sunday July 05 2018 00:25:38 EST Ventricular Rate:  125 PR Interval:    QRS Duration: 118 QT Interval:  324 QTC Calculation: 468 R Axis:   83 Text Interpretation:  Sinus tachycardia LAE, consider biatrial  enlargement Nonspecific intraventricular conduction delay Repol abnrm suggests ischemia, diffuse leads Confirmed by Gilda Creaseollina, Rasa Degrazia J ([id number]) on 07/30/2018 12:48:35 AM   Radiology Dg Chest Port 1 View  Result Date: 07/28/2018 CLINICAL DATA:  Respiratory failure. Status post CPR and intubation. EXAM: PORTABLE CHEST 1 VIEW COMPARISON:  09/23/2016 FINDINGS: The tip of an endotracheal tube is 5.4 cm above the carina in satisfactory  position. External defibrillator paddle projects over the cardiac silhouette with EKG monitors projecting over the thorax. A gastric tube extends below the left hemidiaphragm. No acute pulmonary consolidation, effusion or pneumothorax. No acute displaced rib fractures. IMPRESSION: 1. Satisfactory support line and tube positions. 2. No acute pulmonary disease. Electronically Signed   By: Tollie Ethavid  Kwon M.D.   On: 07/19/2018 01:32   Dg Abd Portable 1v  Result Date: 07/13/2018 CLINICAL DATA:  OG tube placement EXAM: PORTABLE ABDOMEN - 1 VIEW COMPARISON:  None. FINDINGS: Enteric tube tip is in the left upper quadrant consistent with location in body of the stomach. Gas-filled mildly dilated small bowel, incompletely included, suggesting ileus or obstruction. IMPRESSION: Enteric tube tip is in the left upper quadrant consistent with location in the body of the stomach. Gas-filled mildly dilated small bowel suggesting ileus or obstruction. Electronically Signed   By: Burman NievesWilliam  Stevens M.D.   On: 07/13/2018 01:41    Procedures .Critical Care Performed by: Gilda CreasePollina, Arriyanna Mersch J, MD Authorized by: Gilda CreasePollina, Khalea Ventura J, MD   Critical care provider statement:    Critical care time (minutes):  40   Critical care time was exclusive of:  Separately billable procedures and treating other patients   Critical care was necessary to treat or prevent imminent or life-threatening deterioration of the following conditions:  Respiratory failure, CNS failure or compromise and toxidrome    Critical care was time spent personally by me on the following activities:  Ordering and performing treatments and interventions, ordering and review of laboratory studies, ordering and review of radiographic studies, discussions with consultants, pulse oximetry, re-evaluation of patient's condition, evaluation of patient's response to treatment, examination of patient and review of old charts   I assumed direction of critical care for this patient from another provider in my specialty: no     (including critical care time)  Medications Ordered in ED Medications  propofol (DIPRIVAN) 1000 MG/100ML infusion (10 mcg/kg/min  82 kg Intravenous New Bag/Given 07/26/2018 0048)  sodium chloride 0.9 % bolus 1,000 mL (has no administration in time range)    Followed by  sodium chloride 0.9 % bolus 1,000 mL (has no administration in time range)  midazolam (VERSED) 2 MG/2ML injection (has no administration in time range)  norepinephrine (LEVOPHED) 4mg  in NS 250mL premix infusion (has no administration in time range)  aspirin suppository 300 mg (has no administration in time range)  cisatracurium (NIMBEX) bolus via infusion 8.2 mg (has no administration in time range)    And  cisatracurium (NIMBEX) 200 mg in sodium chloride 0.9 % 200 mL (1 mg/mL) infusion (has no administration in time range)    And  cisatracurium (NIMBEX) bolus via infusion 4.1 mg (has no administration in time range)  artificial tears (LACRILUBE) ophthalmic ointment 1 application (has no administration in time range)  heparin injection 5,000 Units (has no administration in time range)  sodium chloride 0.9 % bolus 500 mL (has no administration in time range)  fentaNYL (SUBLIMAZE) injection 100 mcg (has no administration in time range)  fentaNYL 2500mcg in NS 250mL (3110mcg/ml) infusion-PREMIX (has no administration in time range)  fentaNYL (SUBLIMAZE) bolus via infusion 50 mcg (has no administration in time range)  propofol (DIPRIVAN) 1000  MG/100ML infusion (has no administration in time range)  famotidine (PEPCID) IVPB 20 mg premix (has no administration in time range)  midazolam PF (VERSED) injection 4 mg (has no administration in time range)  0.9 %  sodium chloride infusion (has no administration in time range)  naloxone [facility]) injection (2 mg Intravenous Given 07/20/2018 0030)  etomidate (AMIDATE) injection (20 mg Intravenous Given July 20, 2018 0033)  succinylcholine (ANECTINE) injection (100 mg Intravenous Given 07/20/2018 0033)     Initial Impression / Assessment and Plan / ED Course  I have reviewed the triage vital signs and the nursing notes.  Pertinent labs & imaging results that were available during my care of the patient were reviewed by me and considered in my medical decision making (see chart for details).     Patient brought to the emergency department by EMS after suffering cardiopulmonary arrest.  Patient has a known history of drug abuse.  It is suspected that he overdosed on an unknown drug.  His friend witnessed him become more obtunded over time.  He was then found to be pulseless by first responders after 911 was called.  Patient underwent CPR twice during transport, once for 10 minutes, second time for 5 minutes.  At arrival to the ER, patient has stable vital signs.  He is in a sinus tachycardia and hypertensive.  He had a King's airway in place at arrival.  When this was removed to perform intubation, he was noted to be initiating breaths, exhibiting snoring respirations.  He does not, however, exhibit any purposeful response to stimuli.  Intubation was performed by Roxy Horseman, PA-C (see separate note for documentation).  Discussed with Dr. Arsenio Loader, on-call for critical care.  He did recommend initiating cooling.  Final Clinical Impressions(s) / ED Diagnoses   Final diagnoses:  Cardiopulmonary arrest with successful resuscitation [facility])    ED Discharge Orders    None       Gilda Crease,  MD 07/20/18 [phone]

## 2018-07-05 NOTE — ED Notes (Signed)
PA Dahlia Client second attempt at intubation w/ glidescope. MD Pollina remains at bedside

## 2018-07-05 NOTE — ED Notes (Signed)
PA Browning attempting intubation, MD pollina supervising at bedside

## 2018-07-05 NOTE — Progress Notes (Signed)
Chaplain responded to post-CPR.  Patient found in long stay hotel. According to EMT he had been recently released from jail. Chaplain reached out to mother in [place].  [name].  [phone].  She had called [facility] inquiring pt's status.  Chaplain returned call but it went to answering machine.  Nurse spoke with pt's brother and advised that he was critical and presence of next of kin would be helpful. Chaplain clarified with nurse and son that Christopher Berg, listed on facesheet is the STEP-mother not mother, but that patient sees both as moms. Will continue to be available. Lynnell Chad Pager [phone]

## 2018-07-05 NOTE — ED Triage Notes (Signed)
Pt BIB [facility] s/p cardiac arrest. Pt was witnessed by friend w/ decreased responsiveness, ultimately becoming unresponsive prompting him to call 911. PD arrived on scene admin 2 mg IN Narcan w/ no response, at that time, fire initiated CPR. On EMS arrival on scene @2344 , pt CPR in progress in PEA. 10 Minutes CPR, 2 rounds EPI, EMS achieved ROSC. Pt rearrested during transport 5 minutes CPR, 2 rounds EPI, ROSC again. Pt arrives w/ ROSC rhythm of sinus tach @ 120. Normotensive. No sedation by EMS, pt not making purposeful movements or withdrawing. King Airway in place.

## 2018-07-05 NOTE — ED Notes (Signed)
Hold intubation, attempting secondary dose of narcan per MD Pollina.

## 2018-07-05 NOTE — ED Notes (Signed)
PA Browning attempting intubation.

## 2018-07-05 NOTE — ED Provider Notes (Signed)
  Physical Exam  BP (!) 124/105   Pulse (!) 102   Resp 19   Ht 6' (1.829 m)   Wt 82.1 kg   SpO2 100%   BMI 24.55 kg/m    ED Course/Procedures     Procedure Name: Intubation Date/Time: 07/26/2018 1:50 AM Performed by: Montine Circle, PA-C Pre-anesthesia Checklist: Patient identified, Emergency Drugs available, Suction available, Timeout performed and Patient being monitored Oxygen Delivery Method: Ambu bag Preoxygenation: Pre-oxygenation with 100% oxygen Induction Type: IV induction Ventilation: Mask ventilation without difficulty Laryngoscope Size: Mac, 3 and Glidescope Tube size: 7.5 mm Number of attempts: 3 (1 DL w/o RSI meds, 1 DL w/RSI meds, 1 glidescope) Airway Equipment and Method: Video-laryngoscopy Secured at: 23 cm Tube secured with: ETT holder Dental Injury: Teeth and Oropharynx as per pre-operative assessment  Difficulty Due To: Difficult Airway- due to reduced neck mobility            Montine Circle, PA-C 07/10/2018 0235    Orpah Greek, MD 07/13/2018 [phone]

## 2018-07-05 NOTE — Progress Notes (Addendum)
Patient seen and examined by Dr. Laurence Slate this morning. Continues to be on hypothermia protocol.  Breathing with the ventilator.  No pupillary response, no corneal response, no cough or gag. Prognostication would be done only after an exam is done 48 to 72 hours after rewarming. Symptomatic treatment of myoclonus with Keppra, propofol.  Add Depakote if he continues to have myoclonic jerking. I will follow-up on the formal reading of the EEG. Neurology will follow with you.   -- Milon Dikes, MD [name] Pager: [phone] If 7pm to 7am, please call on call as listed on AMION.   Addendum EEG shows burst suppression. Continue supportive treatment and management per PCCM. Neurology will follow with you.  Formal prognostication not before 48 to 72 hours after rewarming. Symptomatic treatment of myoclonus with Keppra propofol.  Depakote can be added if continues myoclonic jerking.  -- Milon Dikes, MD [name] Pager: [phone] If 7pm to 7am, please call on call as listed on AMION.

## 2018-07-05 NOTE — Procedures (Signed)
ELECTROENCEPHALOGRAM REPORT   Patient: Christopher Berg       Room #: 0Y17C EEG No. ID: [id number] Age: 38 y.o.        Sex: male Referring Physician: Sabino Niemann Report Date:  07/22/2018        Interpreting Physician: Thana Farr  History: Christopher Berg is an 38 y.o. male s/p arrest  Medications:  ASA, Nimbex, Pepcid, Fentanyl, Insulin, Keppra, Versed, Levophed, Cleviprex  Conditions of Recording:  This is a 21 channel routine scalp EEG performed with bipolar and monopolar montages arranged in accordance to the international 10/20 system of electrode placement. One channel was dedicated to EKG recording.  The patient is in the intubated and sedated state.  Description:  The background activity is discontinuous.  It consists of periods of apparent attenuation with superimposed low voltage artifact.  These periods last up to 12 seconds.  These periods of attenuation alternate with periods of high voltage polyspike, spike and slow wave activity that are generalized and last from 1 to 2 seconds.  These high voltage periods of sharp activity correspond clinically with episodes of generalized jerking.  This discontinuous activity is persistent throughout the recording.   Hyperventilation and intermittent photic stimulation were not performed.   IMPRESSION: This is an abnormal EEG due to a burst-suppression pattern seen throughout the tracing.  Burst suppression pattern can be seen in a variety of circumstances, including anesthesia, drug intoxication, hypothermia, as well as cerebral anoxia.  Clinical episodes of jerking do correspond with burst activity.       Thana Farr, MD Neurology [phone] 07/27/2018, 10:36 AM

## 2018-07-05 NOTE — ED Notes (Addendum)
Artic Sun applied by this Copywriter, advertising to cool. Pt initial temp 33.7, MD Ogake aware, states to maintain temp at 33.7 and only intiate cooling if pt rises to 36 C.

## 2018-07-05 NOTE — Progress Notes (Signed)
Events throughout the night: Met patient in ED and he had pads on for 33 degree cold cool, propofol running, went by CT. Dr Loyce Dys, started order for Nimbex (ask for central line, decline, pt has 3 PIV access), CBG 98 (Ogake stated no insulin drip needed) pt was having a lot of myoclonic activity and posturing, EEG was done (Neurology notes)  Aline started, b/p 200/100 and HR 40's to 50's, contacted Dr. Emmit Alexanders who d/c propofol, order Hydralize and started fentanyl and versed drip and wanted to start [place] monitoring. Pt met 33 degree at 0400, Pt b/p are now 180/100 and he is having less activity. RN concern no VTE ordered, informed day shift nurse who will address with CCM.

## 2018-07-05 NOTE — ED Notes (Signed)
No response to 2mg  IV narcan, reattempt intubation per MD Pollina

## 2018-07-05 NOTE — H&P (Signed)
NAME:  Christopher Berg, MRN:  [id number], DOB:  06/13/1980, LOS: 0 ADMISSION DATE:  07/17/2018, CONSULTATION DATE:  07/23/2018 REFERRING MD:  ED attending , CHIEF COMPLAINT:  Cardiac arrest    Brief History   38 year old male with history significant for hepatitis C, and IV drug use who presents after being found unresponsive in cardiac arrest.  Had initial CPR for 10 minutes with 2 rounds of epi with [place].  Later during EMS transport to the hospital had another cardiac arrest with CPR for 5 minutes and received 2 rounds of epi.  History of present illness   38 year old male history significant for daily C and IV drug use who was brought to the hospital by EMS.  History is obtained from chart review and from the ED nurse.  Apparently patient was doing drugs in a car with his friend.  At one point his friend noted that the patient was unresponsive pale.  Then called EMS.  Unclear how long he was unresponsive before EMS was called.  When EMS got out there he was given intranasal Narcan without improvement in his mental status.  He was found to be in cardiac arrest.  Unclear what initial rhythm was.  He had CPR for 10 minutes with 2 rounds of epinephrine and [facility]  A King airway was placed in the field.  During transport by EMS he had another cardiac arrest.  CPR done for 5 minutes with 2 rounds of epi and Rusk was achieved.  Patient got to the ED and was completely unresponsive.  King tube was exchanged for endotracheal tube but there was some difficulties with intubation.  He was given etomidate and succinylcholine for intubation.  His friend who he lives with came in later to see him.  They state that patient has longstanding history of IV drug use.  He was in jail for 35 days and got out 2 weeks ago.  According to them he had not done any drugs in the past 2 weeks until tonight.  They state that they saw him at 1030 and he must have left the house to go to drugs.  He has not been having any  other symptoms or thing off.   Past Medical History  -IVDU -Hepatitis C  Significant Hospital Events   -Hypothermia protocol -Intubation   Consults:  -Neurology consult   Procedures:  -Intubation 2/2   Significant Diagnostic Tests:  -CXR-Normal -CT head-Pending   Micro Data:  -None   Antimicrobials:  -None   Interim history/subjective:    Objective   Blood pressure (!) 124/105, pulse (!) 102, resp. rate 19, height 6' (1.829 m), weight 82.1 kg, SpO2 100 %.    Vent Mode: PRVC FiO2 (%):  [60 %] 60 % Set Rate:  [20 bmp] 20 bmp Vt Set:  [580 mL-620 mL] 620 mL PEEP:  [5 cmH20] 5 cmH20  No intake or output data in the 24 hours ending 07/04/2018 0239 Filed Weights   07/30/2018 0042 07/19/2018 0050  Weight: 82 kg 82.1 kg    Examination: General: Lying in bed, intubated and ventilated, completely unresponsive even to noxious stimuli HENT: Has ET tube and orogastric tube Lungs: Clear to auscultation bilaterally Cardiovascular: Regular rate and rhythm, heart sounds normal, Abdomen: Abdomen is soft, no organomegaly palpated Extremities: No deformities in the extremities Neuro: Unresponsive to noxious stimuli, on propofol GTT, pupils equal and reactive to light. GU: Has a Foley  Resolved Hospital Problem list   -N/A  Assessment &  Plan:  #Cardiac arrest  #Respiratory failure on mechanical ventilation Patient found to be cardiac arrest after he was in his car doing drugs.  He was given Narcan without good response.  And ACLS for a total of 15 minutes. He has not had any neurological response since arrival to the hospital. Suspect that his cardiac arrest might of started off as a respiratory arrest. -We will admit to ICU -Targeted temperature management for 24 hours -Sedation with propofol -Analgesia with fentanyl -Nimbex for paralysis -Lung protective ventilation strategies -VAP Prevention protocol -Replete electrolytes as appropriate  #Acute  encephalopathy #Myoclonic jerks  Patient with acute encephalopathy and unresponsiveness since arrival to the ED.  He has also developed myoclonic jerks.  This is concerning for hypoxic brain injury. -We will consult neurology for EEG to make sure he does not have any underlying seizure activity -Sedation with propofol as above -We will also ask neurology to weigh in on neurological prognostic occasion -CT head is pending  #IVDU #Hepatitis C -Check hepatitis panel -UDS pending -Substance abuse counseling if patient wakes up and gets extubated  Best practice:  Diet: NPO for now, dietary consult for initiation and management of tube feeds  Pain/Anxiety/Delirium protocol (if indicated): Fentanyl and propofol VAP protocol (if indicated): HOB elevation, oral care  DVT prophylaxis: Lovenox  GI prophylaxis: famotidine  Glucose control: maintain euglycemia  Mobility: PT/OT as appropriate  Code Status: Full code  Family Communication: Updated friends, his brother Sharia ReeveJosh has a sick child at home and isnt able to come in and his mother lives in [place] Disposition: Admit to ICU  Labs   CBC: Recent Labs  Lab 07/10/2018 0052 07/13/2018 0210  WBC 15.8*  --   NEUTROABS 8.8*  --   HGB 14.6 15.3  HCT 45.1 45.0  MCV 94.5  --   PLT 206  --     Basic Metabolic Panel: Recent Labs  Lab 07/08/2018 0052 07/27/2018 0210  NA 142 142  K 3.9 4.2  CL 110  --   CO2 15*  --   GLUCOSE 273*  --   BUN 11  --   CREATININE 1.05  --   CALCIUM 7.7*  --    GFR: Estimated Creatinine Clearance: 106.8 mL/min (by C-G formula based on SCr of 1.05 mg/dL). Recent Labs  Lab 07/18/2018 0052  WBC 15.8*    Liver Function Tests: Recent Labs  Lab 07/14/2018 0052  AST 107*  ALT 89*  ALKPHOS 57  BILITOT 0.8  PROT 6.4*  ALBUMIN 3.4*   ABG    Component Value Date/Time   PHART 7.170 (LL) 07/14/2018 0210   PCO2ART 59.1 (H) 07/06/2018 0210   PO2ART 280.0 (H) 07/12/2018 0210   HCO3 21.5 07/10/2018 0210   TCO2 23  07/11/2018 0210   ACIDBASEDEF 8.0 (H) 07/08/2018 0210   O2SAT 100.0 07/20/2018 0210     Coagulation Profile: Recent Labs  Lab 07/16/2018 0210  INR 1.03    Cardiac Enzymes: No results for input(s): CKTOTAL, CKMB, CKMBINDEX, TROPONINI in the last 168 hours.  HbA1C: Hgb A1c MFr Bld  Date/Time Value Ref Range Status  12/02/2014 03:04 PM 5.6 4.8 - 5.6 % Final    Comment:    (NOTE)         Pre-diabetes: 5.7 - 6.4         Diabetes: >6.4         Glycemic control for adults with diabetes: <7.0     CBG: Recent Labs  Lab 07/11/2018 0028  GLUCAP 229*    Review of Systems:   Unable to obtain due to patient being intubated and his mental status   Past Medical History  He,  has a past medical history of Anxiety, Asthma, Chronic neck pain, Depression, Headache(784.0), Hepatitis C infection, Kidney stone, and Urinary tract infection.   Surgical History    Past Surgical History:  Procedure Laterality Date  . 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 I&D EXTREMITY Right 12/02/2014   Procedure: IRRIGATION AND DEBRIDEMENT HAND;  Surgeon: Bradly Bienenstock, MD;  Location: [facility] OR;  Service: Orthopedics;  Laterality: Right;  . NO PAST SURGERIES       Social History   reports that he has been smoking cigarettes. He has been smoking about 1.00 pack per day. He has never used smokeless tobacco. He reports current alcohol use. He reports current drug use. Drug: Marijuana.   Family History   His family history is not on file.   Allergies No Known Allergies   Home Medications  Prior to Admission medications   Medication Sig Start Date End Date Taking? Authorizing Provider  oxyCODONE (OXY IR/ROXICODONE) 5 MG immediate release tablet Take 1 tablet (5 mg total)  by mouth once. Patient not taking: Reported on 03/17/2015 12/04/14   Lenell Antu, MD     Critical care time: The patient is critically ill with multiple organ systems failure and requires high complexity decision making for assessment and support, frequent evaluation and titration of therapies, application of advanced monitoring technologies and extensive interpretation of multiple databases.   Critical Care Time devoted to patient care services described in this note is  60 Minutes. This time reflects time of care of this signee Dr. Larna Daughters. This critical care time does not reflect procedure time, or teaching time or supervisory time of PA/NP/Med student/Med Resident etc but could involve care discussion time.  Larna Daughters, M.D. [facility] Pulmonary/Critical Care Medicine. Pager:  After hours pager: [phone].

## 2018-07-05 NOTE — Consult Note (Addendum)
Requesting Physician: Dr. Sabino Niemann    Chief Complaint: Seizure like activity   History obtained from: Patient and Chart    HPI:                                                                                                                                       Christopher Berg is an 38 y.o. male with past medical history of polysubstance and IV drug abuse, hep C presents to the emergency room after being found unresponsive by his friend in the car while doing drugs. Was noted patient to be in cardiac arrest and CPR lasted for about 10 minutes before ROSC.  Patient had another cardiac arrest with CPR lasting for 5 minutes before ROSC.  Patient was intubated for airway protection in the emergency room.  Shortly after patient started having myoclonic jerks every 5 to 10 seconds with upward eye rolling and neurology was consulted for seizure-like activity     Past Medical History:  Diagnosis Date  . Anxiety   . Asthma    "as a child"  . Chronic neck pain   . Depression   . Headache(784.0)    "occas"  . Hepatitis C infection   . Kidney stone    hx of  . Urinary tract infection    hx of    Past Surgical History:  Procedure Laterality Date  . 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 I&D EXTREMITY Right 12/02/2014   Procedure: IRRIGATION AND DEBRIDEMENT HAND;  Surgeon: Bradly Bienenstock, MD;  Location: [facility] OR;  Service: Orthopedics;  Laterality: Right;  . NO PAST SURGERIES      No family history on file. Social History:  reports that he has been smoking cigarettes. He has been smoking about 1.00 pack per day. He has never used smokeless tobacco. He reports current alcohol use. He reports current drug use. Drug: Marijuana.  Allergies: No Known  Allergies  Medications:                                                                                                                        I reviewed home medications   ROS:  14 systems reviewed and negative except above    Examination:                                                                                                      General: Appears well-developed and well-nourished.  Psych: Affect appropriate to situation Eyes: No scleral injection HENT: No OP obstrucion Head: Normocephalic.  Cardiovascular: Normal rate and regular rhythm.  Respiratory: Effort normal and breath sounds normal to anterior ascultation GI: Soft.  No distension. There is no tenderness.  Skin: WDI    Neurological Examination Mental Status: Patient does not respond to verbal stimuli.  Does not respond to deep sternal rub.  Does not follow commands.  No verbalizations noted.  Cranial Nerves: II: patient does not respond confrontation bilaterally, pupils not reactive, III,IV,VI: doll's response absent bilaterally.  V,VII: corneal reflex: Absent VIII: patient does not respond to verbal stimuli IX,X: gag reflex absent XI: trapezius strength unable to test bilaterally XII: tongue strength unable to test Motor: Extremities flaccid throughout.  No spontaneous movement noted.  No purposeful movements noted.  Sensory: Does not respond to noxious stimuli in any extremity. Deep Tendon Reflexes:  Absent throughout. Plantars: mute  Cerebellar: Unable to perform   Lab Results: Basic Metabolic Panel: Recent Labs  Lab 07/12/2018 0052 07/25/2018 0210 07/16/2018 0410  NA 142 142 142  K 3.9 4.2 4.1  CL 110  --   --   CO2 15*  --   --   GLUCOSE 273*  --   --   BUN 11  --   --   CREATININE 1.05  --   --   CALCIUM 7.7*  --   --     CBC: Recent Labs  Lab 07/19/2018 0052  07/10/2018 0210 07/29/2018 0353 07/08/2018 0410  WBC 15.8*  --  12.5*  --   NEUTROABS 8.8*  --   --   --   HGB 14.6 15.3 15.7 15.0  HCT 45.1 45.0 48.8 44.0  MCV 94.5  --  93.5  --   PLT 206  --  180  --     Coagulation Studies: Recent Labs    07/04/2018 0210  LABPROT 13.4  INR 1.03    Imaging: Ct Head Wo Contrast  Result Date: 07/04/2018 CLINICAL DATA:  Cardiac arrest. EXAM: CT HEAD WITHOUT CONTRAST TECHNIQUE: Contiguous axial images were obtained from the base of the skull through the vertex without intravenous contrast. COMPARISON:  CT HEAD March 17, 2015 FINDINGS: Mild motion degraded examination. BRAIN: No intraparenchymal hemorrhage, mass effect nor midline shift. The ventricles and sulci are normal. No acute large vascular territory infarcts. No abnormal extra-axial fluid collections. Basal cisterns are patent. VASCULAR: Unremarkable. SKULL/SOFT TISSUES: No skull fracture. No significant soft tissue swelling. ORBITS/SINUSES: The included ocular globes and orbital contents are normal.Trace paranasal sinus mucosal thickening. Mastoid air cells are well aerated. OTHER: Secretions in the oro and nasopharynx. IMPRESSION: Negative mildly motion degraded CT HEAD without contrast. Electronically Signed   By: Awilda Metroourtnay  Bloomer M.D.   On: 07/15/2018 02:59   Dg Chest Port 1  View  Result Date: 07/27/2018 CLINICAL DATA:  Respiratory failure. Status post CPR and intubation. EXAM: PORTABLE CHEST 1 VIEW COMPARISON:  09/23/2016 FINDINGS: The tip of an endotracheal tube is 5.4 cm above the carina in satisfactory position. External defibrillator paddle projects over the cardiac silhouette with EKG monitors projecting over the thorax. A gastric tube extends below the left hemidiaphragm. No acute pulmonary consolidation, effusion or pneumothorax. No acute displaced rib fractures. IMPRESSION: 1. Satisfactory support line and tube positions. 2. No acute pulmonary disease. Electronically Signed   By: Tollie Ethavid  Kwon  M.D.   On: 04-06-2019 01:32   Dg Abd Portable 1v  Result Date: 07/29/2018 CLINICAL DATA:  OG tube placement EXAM: PORTABLE ABDOMEN - 1 VIEW COMPARISON:  None. FINDINGS: Enteric tube tip is in the left upper quadrant consistent with location in body of the stomach. Gas-filled mildly dilated small bowel, incompletely included, suggesting ileus or obstruction. IMPRESSION: Enteric tube tip is in the left upper quadrant consistent with location in the body of the stomach. Gas-filled mildly dilated small bowel suggesting ileus or obstruction. Electronically Signed   By: Burman NievesWilliam  Stevens M.D.   On: 04-06-2019 01:41     I have reviewed the above imaging : Reviewed head CT no cerebral edema present at this time.  ASSESSMENT AND PLAN   38 year old male status post cardiac arrest likely due to drug overdose versus arrhythmia.  Patient now intubated and off sedation with exam concerning for myoclonus.  EEG was performed showed burst suppression pattern with bursts  correlating with myoclonus.  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

## 2018-07-05 NOTE — Progress Notes (Addendum)
eLink Physician-Brief Progress Note Patient Name: Christopher Berg DOB: February 06, 1981 MRN: [id number]   Date of Service  07-12-2018  HPI/Events of Note  ABG on 100%/PRVC 26/TV 620/P 5 = 7.17/59.1/280.0.   eICU Interventions  Will order: 1. PRVC rate already increased to 26. 2. Repeat ABG at 4 AM.      Intervention Category Major Interventions: Respiratory failure - evaluation and management;Acid-Base disturbance - evaluation and management  Sommer,Steven Eugene 07/12/18, 2:53 AM

## 2018-07-05 NOTE — Progress Notes (Signed)
Patient transported to and from CT uneventful trip. Report given to unit RRT.

## 2018-07-05 NOTE — ED Notes (Signed)
Successful intubation by PA Browning 7.5 ETT 23@teeth . + color change and equal blt breath sounds.

## 2018-07-06 ENCOUNTER — Inpatient Hospital Stay (HOSPITAL_COMMUNITY): Payer: Medicaid Other

## 2018-07-06 DIAGNOSIS — J9600 Acute respiratory failure, unspecified whether with hypoxia or hypercapnia: Secondary | ICD-10-CM

## 2018-07-06 DIAGNOSIS — I469 Cardiac arrest, cause unspecified: Secondary | ICD-10-CM

## 2018-07-06 LAB — BASIC METABOLIC PANEL
ANION GAP: 11 (ref 5–15)
Anion gap: 12 (ref 5–15)
Anion gap: 11 (ref 5–15)
Anion gap: 11 (ref 5–15)
Anion gap: 9 (ref 5–15)
Anion gap: 10 (ref 5–15)
Anion gap: 10 (ref 5–15)
Anion gap: 13 (ref 5–15)
BUN: 11 mg/dL (ref 6–20)
BUN: 11 mg/dL (ref 6–20)
BUN: 11 mg/dL (ref 6–20)
BUN: 10 mg/dL (ref 6–20)
BUN: 11 mg/dL (ref 6–20)
BUN: 9 mg/dL (ref 6–20)
BUN: 11 mg/dL (ref 6–20)
BUN: 12 mg/dL (ref 6–20)
CO2: 19 mmol/L — ABNORMAL LOW (ref 22–32)
CO2: 20 mmol/L — ABNORMAL LOW (ref 22–32)
CO2: 19 mmol/L — ABNORMAL LOW (ref 22–32)
CO2: 19 mmol/L — ABNORMAL LOW (ref 22–32)
CO2: 21 mmol/L — ABNORMAL LOW (ref 22–32)
CO2: 21 mmol/L — ABNORMAL LOW (ref 22–32)
CO2: 22 mmol/L (ref 22–32)
CO2: 20 mmol/L — ABNORMAL LOW (ref 22–32)
Calcium: 8.9 mg/dL (ref 8.9–10.3)
Calcium: 8.9 mg/dL (ref 8.9–10.3)
Calcium: 8.9 mg/dL (ref 8.9–10.3)
Calcium: 8.5 mg/dL — ABNORMAL LOW (ref 8.9–10.3)
Calcium: 9 mg/dL (ref 8.9–10.3)
Calcium: 8.8 mg/dL — ABNORMAL LOW (ref 8.9–10.3)
Calcium: 8.2 mg/dL — ABNORMAL LOW (ref 8.9–10.3)
Calcium: 8.6 mg/dL — ABNORMAL LOW (ref 8.9–10.3)
Chloride: 110 mmol/L (ref 98–111)
Chloride: 110 mmol/L (ref 98–111)
Chloride: 111 mmol/L (ref 98–111)
Chloride: 112 mmol/L — ABNORMAL HIGH (ref 98–111)
Chloride: 112 mmol/L — ABNORMAL HIGH (ref 98–111)
Chloride: 111 mmol/L (ref 98–111)
Chloride: 109 mmol/L (ref 98–111)
Chloride: 109 mmol/L (ref 98–111)
Creatinine, Ser: 0.48 mg/dL — ABNORMAL LOW (ref 0.61–1.24)
Creatinine, Ser: 0.58 mg/dL — ABNORMAL LOW (ref 0.61–1.24)
Creatinine, Ser: 0.51 mg/dL — ABNORMAL LOW (ref 0.61–1.24)
Creatinine, Ser: 0.44 mg/dL — ABNORMAL LOW (ref 0.61–1.24)
Creatinine, Ser: 0.55 mg/dL — ABNORMAL LOW (ref 0.61–1.24)
Creatinine, Ser: 0.53 mg/dL — ABNORMAL LOW (ref 0.61–1.24)
Creatinine, Ser: 0.87 mg/dL (ref 0.61–1.24)
Creatinine, Ser: 0.95 mg/dL (ref 0.61–1.24)
GFR calc Af Amer: >60 mL/min (ref >60)
GFR calc Af Amer: >60 mL/min (ref >60)
GFR calc Af Amer: >60 mL/min (ref >60)
GFR calc Af Amer: >60 mL/min (ref >60)
GFR calc Af Amer: >60 mL/min (ref >60)
GFR calc Af Amer: >60 mL/min (ref >60)
GFR calc Af Amer: >60 mL/min (ref >60)
GFR calc non Af Amer: >60 mL/min (ref >60)
GFR calc non Af Amer: >60 mL/min (ref >60)
GFR calc non Af Amer: >60 mL/min (ref >60)
GFR calc non Af Amer: >60 mL/min (ref >60)
GLUCOSE: 102 mg/dL — AB (ref 70–99)
GLUCOSE: 98 mg/dL (ref 70–99)
Glucose, Bld: 132 mg/dL — ABNORMAL HIGH (ref 70–99)
Glucose, Bld: 105 mg/dL — ABNORMAL HIGH (ref 70–99)
Glucose, Bld: 109 mg/dL — ABNORMAL HIGH (ref 70–99)
Glucose, Bld: 104 mg/dL — ABNORMAL HIGH (ref 70–99)
Glucose, Bld: 144 mg/dL — ABNORMAL HIGH (ref 70–99)
Glucose, Bld: 117 mg/dL — ABNORMAL HIGH (ref 70–99)
POTASSIUM: 3.4 mmol/L — AB (ref 3.5–5.1)
POTASSIUM: 3.4 mmol/L — AB (ref 3.5–5.1)
POTASSIUM: 3.6 mmol/L (ref 3.5–5.1)
Potassium: 3.3 mmol/L — ABNORMAL LOW (ref 3.5–5.1)
Potassium: 3.3 mmol/L — ABNORMAL LOW (ref 3.5–5.1)
Potassium: 3.6 mmol/L (ref 3.5–5.1)
Potassium: 4.1 mmol/L (ref 3.5–5.1)
Potassium: 4.1 mmol/L (ref 3.5–5.1)
SODIUM: 141 mmol/L (ref 135–145)
SODIUM: 142 mmol/L (ref 135–145)
Sodium: 141 mmol/L (ref 135–145)
Sodium: 141 mmol/L (ref 135–145)
Sodium: 141 mmol/L (ref 135–145)
Sodium: 142 mmol/L (ref 135–145)
Sodium: 142 mmol/L (ref 135–145)
Sodium: 142 mmol/L (ref 135–145)

## 2018-07-06 LAB — MAGNESIUM
MAGNESIUM: 1.8 mg/dL (ref 1.7–2.4)
Magnesium: 1.7 mg/dL (ref 1.7–2.4)
Magnesium: 1.8 mg/dL (ref 1.7–2.4)

## 2018-07-06 LAB — POCT I-STAT 7, (LYTES, BLD GAS, ICA,H+H)
Acid-base deficit: 1 mmol/L (ref 0.0–2.0)
Bicarbonate: 21.4 mmol/L (ref 20.0–28.0)
Calcium, Ion: 1.18 mmol/L (ref 1.15–1.40)
HCT: 48 % (ref 39.0–52.0)
Hemoglobin: 16.3 g/dL (ref 13.0–17.0)
O2 SAT: 99 %
PH ART: 7.518 — AB (ref 7.350–7.450)
Patient temperature: 34.4
Potassium: 3.4 mmol/L — ABNORMAL LOW (ref 3.5–5.1)
Sodium: 141 mmol/L (ref 135–145)
TCO2: 22 mmol/L (ref 22–32)
pCO2 arterial: 25.8 mmHg — ABNORMAL LOW (ref 32.0–48.0)
pO2, Arterial: 127 mmHg — ABNORMAL HIGH (ref 83.0–108.0)

## 2018-07-06 LAB — CBC
HCT: 49.8 % (ref 39.0–52.0)
HEMOGLOBIN: 17.3 g/dL — AB (ref 13.0–17.0)
MCH: 30.9 pg (ref 26.0–34.0)
MCHC: 34.7 g/dL (ref 30.0–36.0)
MCV: 89.1 fL (ref 80.0–100.0)
Platelets: 175 10*3/uL (ref 150–400)
RBC: 5.59 MIL/uL (ref 4.22–5.81)
RDW: 13.2 % (ref 11.5–15.5)
WBC: 12.2 10*3/uL — ABNORMAL HIGH (ref 4.0–10.5)
nRBC: 0 % (ref 0.0–0.2)

## 2018-07-06 LAB — BLOOD GAS, ARTERIAL
Acid-base deficit: 2.7 mmol/L — ABNORMAL HIGH (ref 0.0–2.0)
Bicarbonate: 20.2 mmol/L (ref 20.0–28.0)
FIO2: 0.4
MECHVT: 620 mL
O2 Saturation: 98.7 %
PEEP/CPAP: 5 cmH2O
Patient temperature: 98.6
RATE: 24 resp/min
pCO2 arterial: 27.1 mmHg — ABNORMAL LOW (ref 32.0–48.0)
pH, Arterial: 7.485 — ABNORMAL HIGH (ref 7.350–7.450)
pO2, Arterial: 134 mmHg — ABNORMAL HIGH (ref 83.0–108.0)

## 2018-07-06 LAB — POCT I-STAT 4, (NA,K, GLUC, HGB,HCT)
Glucose, Bld: 105 mg/dL — ABNORMAL HIGH (ref 70–99)
Glucose, Bld: 99 mg/dL (ref 70–99)
Glucose, Bld: 136 mg/dL — ABNORMAL HIGH (ref 70–99)
HCT: 48 % (ref 39.0–52.0)
HCT: 45 % (ref 39.0–52.0)
HCT: 45 % (ref 39.0–52.0)
HEMOGLOBIN: 15.3 g/dL (ref 13.0–17.0)
Hemoglobin: 16.3 g/dL (ref 13.0–17.0)
Hemoglobin: 15.3 g/dL (ref 13.0–17.0)
Potassium: 3.4 mmol/L — ABNORMAL LOW (ref 3.5–5.1)
Potassium: 3.6 mmol/L (ref 3.5–5.1)
Potassium: 4.1 mmol/L (ref 3.5–5.1)
Sodium: 142 mmol/L (ref 135–145)
Sodium: 143 mmol/L (ref 135–145)
Sodium: 143 mmol/L (ref 135–145)

## 2018-07-06 LAB — HEPATITIS PANEL, ACUTE
HCV Ab: >11 s/co ratio — ABNORMAL HIGH (ref 0.0–0.9)
Hep A IgM: NEGATIVE
Hep B C IgM: NEGATIVE
Hepatitis B Surface Ag: NEGATIVE

## 2018-07-06 LAB — GLUCOSE, CAPILLARY
Glucose-Capillary: 125 mg/dL — ABNORMAL HIGH (ref 70–99)
Glucose-Capillary: 89 mg/dL (ref 70–99)
Glucose-Capillary: 92 mg/dL (ref 70–99)
Glucose-Capillary: 94 mg/dL (ref 70–99)
Glucose-Capillary: 104 mg/dL — ABNORMAL HIGH (ref 70–99)

## 2018-07-06 LAB — TRIGLYCERIDES: Triglycerides: 201 mg/dL — ABNORMAL HIGH (ref <150)

## 2018-07-06 LAB — ECHOCARDIOGRAM COMPLETE
Height: 72 in
Weight: 2895.96 oz

## 2018-07-06 LAB — PHOSPHORUS
Phosphorus: 2.2 mg/dL — ABNORMAL LOW (ref 2.5–4.6)
Phosphorus: 2.7 mg/dL (ref 2.5–4.6)
Phosphorus: 2.9 mg/dL (ref 2.5–4.6)

## 2018-07-06 MED ORDER — VITAL AF 1.2 CAL PO LIQD
1000.0000 mL | ORAL | Status: DC
  Administered 2018-07-06 – 2018-07-08 (×3): 1000 mL

## 2018-07-06 MED ORDER — POTASSIUM CHLORIDE 10 MEQ/100ML IV SOLN
INTRAVENOUS | Status: AC
  Administered 2018-07-06: 10 meq
  Filled 2018-07-06: qty 100

## 2018-07-06 MED ORDER — VALPROATE SODIUM 500 MG/5ML IV SOLN
400.0000 mg | Freq: Three times a day (TID) | INTRAVENOUS | Status: DC
  Administered 2018-07-07 – 2018-07-09 (×8): 400 mg via INTRAVENOUS
  Filled 2018-07-06 (×9): qty 4

## 2018-07-06 MED ORDER — PROPOFOL 1000 MG/100ML IV EMUL
INTRAVENOUS | Status: AC
  Administered 2018-07-06: 20 ug/kg/min via INTRAVENOUS
  Filled 2018-07-06: qty 100

## 2018-07-06 MED ORDER — PROPOFOL 1000 MG/100ML IV EMUL
5.0000 ug/kg/min | INTRAVENOUS | Status: DC
  Administered 2018-07-06: 40 ug/kg/min via INTRAVENOUS
  Administered 2018-07-06: 20 ug/kg/min via INTRAVENOUS
  Administered 2018-07-07: 36 ug/kg/min via INTRAVENOUS
  Administered 2018-07-07: 40 ug/kg/min via INTRAVENOUS
  Filled 2018-07-06: qty 200
  Filled 2018-07-06 (×2): qty 100

## 2018-07-06 MED ORDER — VALPROATE SODIUM 500 MG/5ML IV SOLN
2500.0000 mg | INTRAVENOUS | Status: AC
  Administered 2018-07-06: 2500 mg via INTRAVENOUS
  Filled 2018-07-06: qty 25

## 2018-07-06 MED ORDER — POTASSIUM CHLORIDE 10 MEQ/50ML IV SOLN: 10.0000 meq | INTRAVENOUS | Status: AC

## 2018-07-06 MED ORDER — SODIUM CHLORIDE 0.9 % IV BOLUS
500.0000 mL | Freq: Once | INTRAVENOUS | Status: AC
  Administered 2018-07-06: 500 mL via INTRAVENOUS

## 2018-07-06 MED ORDER — POTASSIUM CHLORIDE 10 MEQ/50ML IV SOLN: 10.0000 meq | INTRAVENOUS | Status: DC

## 2018-07-06 MED ORDER — NOREPINEPHRINE 4 MG/250ML-% IV SOLN
0.0000 ug/min | INTRAVENOUS | Status: DC
  Administered 2018-07-06: 20 ug/min via INTRAVENOUS
  Administered 2018-07-06: 2 ug/min via INTRAVENOUS
  Administered 2018-07-07: 16 ug/min via INTRAVENOUS
  Administered 2018-07-07 (×3): 20 ug/min via INTRAVENOUS
  Filled 2018-07-06 (×10): qty 250

## 2018-07-06 NOTE — Progress Notes (Signed)
Started propofol for myoclonus. SBP dropped to 60s. Started norepi. Informed Dr. Vassie Loll that norepi was running through PIV as there is no central access. MD stated okay to titrate norepi up to 20 mcg.

## 2018-07-06 NOTE — Progress Notes (Signed)
eLink Physician-Brief Progress Note Patient Name: Christopher Berg DOB: 02/22/1981 MRN: [id number]   Date of Service  07/06/2018  HPI/Events of Note  K+ = 3.4 - Patient being actively rewarmed.   eICU Interventions  Continue to trend K+.     Intervention Category Major Interventions: Electrolyte abnormality - evaluation and management  Jaeshawn Silvio Eugene 07/06/2018, 12:31 AM

## 2018-07-06 NOTE — Progress Notes (Signed)
   NAME:  Christopher Berg, MRN:  [id number], DOB:  09/11/1980, LOS: 1 ADMISSION DATE:  07/04/2018, CONSULTATION DATE:  07/06/2018 REFERRING MD:  ED attending , CHIEF COMPLAINT:  Cardiac arrest    Brief History   38 year old male with history significant for hepatitis C, and IV drug use who presents after being found unresponsive in cardiac arrest.  Had initial CPR for 10 minutes with 2 rounds of epi with ROSC.  Later during EMS transport to the hospital had another cardiac arrest with CPR for 5 minutes and received 2 rounds of epi. UDS pos for BDZ only, ETOH 143  Past Medical History  -IVDU -Hepatitis C  Significant Hospital Events   2/2 Hypothermia protocol 2/2 myoclonus >> neuro - keppra   Consults:  -Neurology consult   Procedures:  -ETT 2/2   Significant Diagnostic Tests:  -CT head -neg acute  EEG with burst suppression    Micro Data:  -None   Antimicrobials:  -None   Interim history/subjective:  He is critically ill. Sedated and paralyzed, rewarming per hypothermia protocol starting at 4 AM Remains hypertensive, on Cleviprex drip   Objective   Blood pressure 110/77, pulse (!) 101, temperature (!) 93.7 F (34.3 C), resp. rate (!) 21, height 6' (1.829 m), weight 82.1 kg, SpO2 100 %.    Vent Mode: PRVC FiO2 (%):  [40 %] 40 % Set Rate:  [24 bmp] 24 bmp Vt Set:  [409[620 mL] 620 mL PEEP:  [5 cmH20] 5 cmH20 Plateau Pressure:  [18 cmH20-19 cmH20] 18 cmH20   Intake/Output Summary (Last 24 hours) at 07/06/2018 [id number] Last data filed at 07/06/2018 0900 Gross per 24 hour  Intake 1612.72 ml  Output 1930 ml  Net -317.28 ml   Filed Weights   02-10-2019 0042 02-10-2019 0050  Weight: 82 kg 82.1 kg    Examination: Acutely ill, disheveled, sedated and paralyzed Clear breath sounds bilateral, minimal secretions S1-S2 normal. Cool extremities Soft and nontender abdomen Pupils pinpoint,  Chest x-ray personally reviewed which shows improvement in right lower lobe  infiltrate  Resolved Hospital Problem list   -  Assessment & Plan:  #Cardiac arrest  -Possibly related to respiratory arrest from drugs  # Acute Respiratory failure on mechanical ventilation -Favor aspiration but will hold off on antibiotics --Not ready for weaning yet -VAP Prevention protocol  Hypertensive  -Unresponsive to hydralazine -Ct Cleviprex , aim for systolic blood pressure 1 40-1 80   #Acute encephalopathy, concerning for anoxia #Myoclonic jerks  EEG 2/2 burst suppression, CT head 2/2 no acute process -TTM protocol, rewarming -Neurology following, rpt neuro exam once off paralytics/sedation -Continue Keppra   #IVDU - UDS surprisingly only pos for BDZ #Hepatitis C - hepatitis panel pending  Hypokalemia-while rewarming now ,so expect to improve  Family[place]mother [place] from [place] but not at bedside this morning, will update after neuro exam post rewarming Prognosis guarded in view of myoclonus  The patient is critically ill with multiple organ systems failure and requires high complexity decision making for assessment and support, frequent evaluation and titration of therapies, application of advanced monitoring technologies and extensive interpretation of multiple databases. Critical Care Time devoted to patient care services described in this note independent of APP/resident  time is 35 minutes.    Christopher Mourningakesh Alva MD. Tonny BollmanFCCP. [facility] Pager [phone] If no response call [phone]   07/06/2018

## 2018-07-06 NOTE — Progress Notes (Addendum)
NEUROLOGY PROGRESS NOTE  Subjective: Patient is currently intubated.  Nimbex was DC'd.  Patient then showed significant mild clonus.  He is showing whole body proximal mild clonus that last for approximately 3 to 5 seconds, stops, restarts 12 seconds later and is in a very cyclical manner.  Exam: Vitals:   07/06/18 1459 07/06/18 1500  BP: 119/60 100/87  Pulse: (!) 113 (!) 106  Resp: 20 20  Temp:  (!) 95.7 F (35.4 C)  SpO2: 99% 99%    Physical Exam   HEENT-  Normocephalic, no lesions, without obvious abnormality.  Normal external eye and conjunctiva.  Extremities- Warm, dry and intact Musculoskeletal-no joint tenderness, deformity or swelling Skin-warm and dry, no hyperpigmentation, vitiligo, or suspicious lesions  Neuro:  Mental Status: Patient does not respond to verbal stimuli.  Does not respond to deep sternal rub.  Does not follow commands.  No verbalizations noted.  Cranial Nerves: II: patient does not respond confrontation bilaterally,  III,IV,VI: doll's response absent bilaterally. pupils right 2 mm, left 2 mm,and nonreactive bilaterally V,VII: corneal reflex present bilaterally  VIII: patient does not respond to verbal stimuli IX,X: gag reflex absent, trapezius strength unable to test bilaterally XII: tongue strength unable to test Motor: Extremities flaccid throughout.  With the exception of mild clonus.  Does not withdraw from pain. Sensory: No withdrawal from pain. Deep Tendon Reflexes:  Absent throughout. Plantars: absent bilaterally Cerebellar: Unable to perform    Medications:  Scheduled: . artificial tears  1 application Both Eyes G8Q  . chlorhexidine gluconate (MEDLINE KIT)  15 mL Mouth Rinse BID  . cisatracurium  0.1 mg/kg Intravenous Once  . enoxaparin (LOVENOX) injection  40 mg Subcutaneous Q24H  . fentaNYL (SUBLIMAZE) injection  100 mcg Intravenous Once  . insulin aspart  0-9 Units Subcutaneous Q4H  . mouth rinse  15 mL Mouth Rinse 10 times  per day    Pertinent Labs/Diagnostics: EEG :this is an abnormal EEG due to a burst-suppression pattern seen throughout the tracing. Burst suppression pattern can be seen in a variety of circumstances, including anesthesia, drug intoxication, hypothermia, as well as cerebral anoxia. Clinical episodes of jerking do correspond with burst activity.  Ct Head Wo Contrast  Result Date: 07/14/2018 CLINICAL DATA:  Cardiac arrest. EXAM: CT HEAD WITHOUT CONTRAST TECHNIQUE: Contiguous axial images were obtained from the base of the skull through the vertex without intravenous contrast. COMPARISON:  CT HEAD March 17, 2015 FINDINGS: Mild motion degraded examination. BRAIN: No intraparenchymal hemorrhage, mass effect nor midline shift. The ventricles and sulci are normal. No acute large vascular territory infarcts. No abnormal extra-axial fluid collections. Basal cisterns are patent. VASCULAR: Unremarkable. SKULL/SOFT TISSUES: No skull fracture. No significant soft tissue swelling. ORBITS/SINUSES: The included ocular globes and orbital contents are normal.Trace paranasal sinus mucosal thickening. Mastoid air cells are well aerated. OTHER: Secretions in the oro and nasopharynx. IMPRESSION: Negative mildly motion degraded CT HEAD without contrast. Electronically Signed   By: Elon Alas M.D.   On: 07/18/2018 02:59   Dg Chest Port 1 View  Result Date: 07/06/2018 CLINICAL DATA:  Respiratory failure EXAM: PORTABLE CHEST 1 VIEW COMPARISON:  Yesterday FINDINGS: Endotracheal tube tip show just below the clavicular heads. The orogastric tube tip and side-port reaches the stomach. Haziness at the right base favoring atelectasis or aspiration in this setting and given volume loss. Normal heart size. No effusion or pneumothorax. IMPRESSION: 1. Stable hardware positioning. 2. Hazy density on the right with volume loss favoring atelectasis or aspiration. Electronically Signed  By: Monte Fantasia M.D.   On: 07/06/2018 06:41    Dg Chest Port 1 View  Result Date: 07/18/2018 CLINICAL DATA:  Respiratory failure. Status post CPR and intubation. EXAM: PORTABLE CHEST 1 VIEW COMPARISON:  09/23/2016 FINDINGS: The tip of an endotracheal tube is 5.4 cm above the carina in satisfactory position. External defibrillator paddle projects over the cardiac silhouette with EKG monitors projecting over the thorax. A gastric tube extends below the left hemidiaphragm. No acute pulmonary consolidation, effusion or pneumothorax. No acute displaced rib fractures. IMPRESSION: 1. Satisfactory support line and tube positions. 2. No acute pulmonary disease. Electronically Signed   By: Ashley Royalty M.D.   On: 07/31/2018 01:32   Dg Abd Portable 1v  Result Date: 07/26/2018 CLINICAL DATA:  OG tube placement EXAM: PORTABLE ABDOMEN - 1 VIEW COMPARISON:  None. FINDINGS: Enteric tube tip is in the left upper quadrant consistent with location in body of the stomach. Gas-filled mildly dilated small bowel, incompletely included, suggesting ileus or obstruction. IMPRESSION: Enteric tube tip is in the left upper quadrant consistent with location in the body of the stomach. Gas-filled mildly dilated small bowel suggesting ileus or obstruction. Electronically Signed   By: Lucienne Capers M.D.   On: 07/06/2018 01:41     Christopher Quill PA-C [name] [phone]   Assessment: 38 year old male brought to hospital after cardiac arrest with unknown downtime.  Nimbex was turned off today unmasking significant myoclonus.   1. Depakote was added along with continuation of Keppra.   2. As stated with previous note myoclonus early on is a very poor prognostic indicator.  Recommendations: - Continue Depakote and Keppra - Continue rewarming - May consider repeat imaging after rewarming for 72 hours   Electronically signed: Dr. Kerney Elbe 07/06/2018, 5:35 PM

## 2018-07-06 NOTE — Progress Notes (Addendum)
Pt w/ inadequate u/o over the last 3 hours. MD paged. Awaiting return call.   1513 Orders received for 500cc bolus.   Will continue to monitor closely.

## 2018-07-06 NOTE — Progress Notes (Signed)
.  [facility] ADULT ICU REPLACEMENT PROTOCOL FOR AM LAB REPLACEMENT ONLY  The patient does apply for the [facility] Adult ICU Electrolyte Replacment Protocol based on the criteria listed below:   1. Is GFR >/= 40 ml/min? Yes.    Patient's GFR today is >60 2. Is urine output >/= 0.5 ml/kg/hr for the last 6 hours? Yes.   Patient's UOP is 1.3 ml/kg/hr 3. Is BUN < 60 mg/dL? Yes.    Patient's BUN today is 11 4. Abnormal electrolyte(s): K 3.3 5. Ordered repletion with: protocol 6. If Berg panic level lab has been reported, has the CCM MD in charge been notified? No..   Physician:    Rewarming from hypothermic therapy will start soon  [facility], Christopher Berg 07/06/2018 4:16 AM

## 2018-07-06 NOTE — Progress Notes (Addendum)
Initial Nutrition Assessment  DOCUMENTATION CODES:   Not applicable  INTERVENTION:   -Vital AF 1.2 @ 20 ml/hr via OGT -Increase by 10 ml Q8 hours to goal rate of 70 ml/hr (1680 ml)  At goal rate TF provides: 2016 kcals, 126 grams protein, 1362 ml free water. Meets 100% of needs.   Monitor magnesium, potassium, and phosphorus daily for at least 3 days, MD to replete as needed, as pt is at risk for refeeding syndrome.   NUTRITION DIAGNOSIS:   Increased nutrient needs related to acute illness as evidenced by estimated needs.  GOAL:   Provide needs based on ASPEN/SCCM guidelines  MONITOR:   Diet advancement, Vent status, Skin, TF tolerance, Weight trends, Labs, I & O's  REASON FOR ASSESSMENT:   Consult Enteral/tube feeding initiation and management  ASSESSMENT:   Patient with PMH significant for Hepatitis C and IVDU. Presents this admission after cardiac arrest requiring CPR x 10 mins.    Pt currently on TTM protocol. Remains on Nimbex. Cleviprex discontinued.  Friend at bedside denies pt had loss of appetite or unintentional weight loss. States he has always been smaller frame around 150-160 lb.   Weight records show mostly stated weights (no weight wt loss recorded). Will utilize EDW of 82 kg to estimated needs.   Patient is currently intubated on ventilator support MV: 12.5 L/min Temp (24hrs), Avg:92 F (33.3 C), Min:91 F (32.8 C), Max:94.6 F (34.8 C) BP: 121/75 MAP: 87  I/O: -1275 ml since admit UOP: 1970 ml x 24 hrs   Medications reviewed and include: nimbex, SSI Labs reviewed: K 3.4 (L) Phosphorus 2.2 (L) calcium ionized 1.14 (L)   NUTRITION - FOCUSED PHYSICAL EXAM:    Most Recent Value  Orbital Region  No depletion  Upper Arm Region  No depletion  Thoracic and Lumbar Region  Unable to assess  Buccal Region  Unable to assess  Temple Region  No depletion  Clavicle Bone Region  Mild depletion  Clavicle and Acromion Bone Region  Mild depletion   Scapular Bone Region  Unable to assess  Dorsal Hand  No depletion  Patellar Region  No depletion  Anterior Thigh Region  Unable to assess  Posterior Calf Region  No depletion  Edema (RD Assessment)  Mild  Hair  Reviewed  Eyes  Unable to assess  Mouth  Unable to assess  Skin  Reviewed  Nails  Reviewed     Diet Order:   Diet Order            Diet NPO time specified  Diet effective now              EDUCATION NEEDS:   Not appropriate for education at this time  Skin:  Skin Assessment: Reviewed RN Assessment  Last BM:  2/2  Height:   Ht Readings from Last 1 Encounters:  2018/10/26 6' (1.829 m)    Weight:   Wt Readings from Last 1 Encounters:  2018/10/26 82.1 kg    Ideal Body Weight:  80.9 kg  BMI:  Body mass index is 24.55 kg/m.  Estimated Nutritional Needs:   Kcal:  2004 kcal  Protein:  115-130 grams  Fluid:  >/= 2 L/day   Vanessa Kickarly Melitta Tigue RD, LDN Clinical Nutrition Pager # - [phone]

## 2018-07-06 NOTE — Progress Notes (Addendum)
Pt w/ myoclonus. 2mg  versed bolus given and continuous rate increased w/o improvement. Scheduled keppra infusing. MD paged. Awaiting return call.   1640 MD made aware of myoclonus. Paged neuro per MD instruction. Awaiting return call.   1647 Neuro made aware of myoclonus. Orders received for valproate.

## 2018-07-07 ENCOUNTER — Inpatient Hospital Stay (HOSPITAL_COMMUNITY): Payer: Medicaid Other

## 2018-07-07 LAB — ABO/RH
ABO/RH(D): O NEG
Weak D: NEGATIVE

## 2018-07-07 LAB — COMPREHENSIVE METABOLIC PANEL
ALT: 62 U/L — ABNORMAL HIGH (ref 0–44)
AST: 75 U/L — ABNORMAL HIGH (ref 15–41)
Albumin: 2.5 g/dL — ABNORMAL LOW (ref 3.5–5.0)
Alkaline Phosphatase: 50 U/L (ref 38–126)
Anion gap: 10 (ref 5–15)
BUN: 9 mg/dL (ref 6–20)
CO2: 22 mmol/L (ref 22–32)
Calcium: 8.7 mg/dL — ABNORMAL LOW (ref 8.9–10.3)
Chloride: 110 mmol/L (ref 98–111)
Creatinine, Ser: 0.74 mg/dL (ref 0.61–1.24)
GFR calc non Af Amer: >60 mL/min (ref >60)
Glucose, Bld: 121 mg/dL — ABNORMAL HIGH (ref 70–99)
Potassium: 3.6 mmol/L (ref 3.5–5.1)
Sodium: 142 mmol/L (ref 135–145)
Total Bilirubin: 1.3 mg/dL — ABNORMAL HIGH (ref 0.3–1.2)
Total Protein: 6.1 g/dL — ABNORMAL LOW (ref 6.5–8.1)

## 2018-07-07 LAB — POCT I-STAT 7, (LYTES, BLD GAS, ICA,H+H)
Acid-Base Excess: 1 mmol/L (ref 0.0–2.0)
Bicarbonate: 25.9 mmol/L (ref 20.0–28.0)
Calcium, Ion: 1.25 mmol/L (ref 1.15–1.40)
HCT: 38 % — ABNORMAL LOW (ref 39.0–52.0)
Hemoglobin: 12.9 g/dL — ABNORMAL LOW (ref 13.0–17.0)
O2 Saturation: 100 %
PO2 ART: 266 mmHg — AB (ref 83.0–108.0)
Patient temperature: 37.1
Potassium: 3.7 mmol/L (ref 3.5–5.1)
Sodium: 139 mmol/L (ref 135–145)
TCO2: 27 mmol/L (ref 22–32)
pCO2 arterial: 40.7 mmHg (ref 32.0–48.0)
pH, Arterial: 7.412 (ref 7.350–7.450)

## 2018-07-07 LAB — URINALYSIS, ROUTINE W REFLEX MICROSCOPIC
Bacteria, UA: NONE SEEN
Bilirubin Urine: NEGATIVE
Glucose, UA: NEGATIVE mg/dL
Ketones, ur: 20 mg/dL — AB
LEUKOCYTES UA: NEGATIVE
Nitrite: NEGATIVE
Protein, ur: 30 mg/dL — AB
Specific Gravity, Urine: 1.029 (ref 1.005–1.030)
pH: 5 (ref 5.0–8.0)

## 2018-07-07 LAB — CBC
HCT: 42.8 % (ref 39.0–52.0)
Hemoglobin: 13.9 g/dL (ref 13.0–17.0)
MCH: 30.8 pg (ref 26.0–34.0)
MCHC: 32.5 g/dL (ref 30.0–36.0)
MCV: 94.7 fL (ref 80.0–100.0)
Platelets: 160 10*3/uL (ref 150–400)
RBC: 4.52 MIL/uL (ref 4.22–5.81)
RDW: 13.7 % (ref 11.5–15.5)
WBC: 9 10*3/uL (ref 4.0–10.5)
nRBC: 0 % (ref 0.0–0.2)

## 2018-07-07 LAB — CBC WITH DIFFERENTIAL/PLATELET
ABS IMMATURE GRANULOCYTES: 0.1 10*3/uL — AB (ref 0.00–0.07)
BASOS PCT: 0 %
Basophils Absolute: 0 10*3/uL (ref 0.0–0.1)
Eosinophils Absolute: 0 10*3/uL (ref 0.0–0.5)
Eosinophils Relative: 0 %
HCT: 42.8 % (ref 39.0–52.0)
Hemoglobin: 13.6 g/dL (ref 13.0–17.0)
Lymphocytes Relative: 30 %
Lymphs Abs: 1.2 10*3/uL (ref 0.7–4.0)
MCH: 29.7 pg (ref 26.0–34.0)
MCHC: 31.8 g/dL (ref 30.0–36.0)
MCV: 93.4 fL (ref 80.0–100.0)
Metamyelocytes Relative: 1 %
Monocytes Absolute: 0.4 10*3/uL (ref 0.1–1.0)
Monocytes Relative: 9 %
Myelocytes: 2 %
NEUTROS PCT: 58 %
Neutro Abs: 2.4 10*3/uL (ref 1.7–7.7)
Platelets: 140 10*3/uL — ABNORMAL LOW (ref 150–400)
RBC: 4.58 MIL/uL (ref 4.22–5.81)
RDW: 13.5 % (ref 11.5–15.5)
WBC: 4.1 10*3/uL (ref 4.0–10.5)
nRBC: 0 % (ref 0.0–0.2)
nRBC: 0 /100 WBC

## 2018-07-07 LAB — GLUCOSE, CAPILLARY
Glucose-Capillary: 94 mg/dL (ref 70–99)
Glucose-Capillary: 102 mg/dL — ABNORMAL HIGH (ref 70–99)
Glucose-Capillary: 101 mg/dL — ABNORMAL HIGH (ref 70–99)
Glucose-Capillary: 116 mg/dL — ABNORMAL HIGH (ref 70–99)
Glucose-Capillary: 103 mg/dL — ABNORMAL HIGH (ref 70–99)
Glucose-Capillary: 114 mg/dL — ABNORMAL HIGH (ref 70–99)
Glucose-Capillary: 116 mg/dL — ABNORMAL HIGH (ref 70–99)

## 2018-07-07 LAB — TRIGLYCERIDES: Triglycerides: 328 mg/dL — ABNORMAL HIGH (ref <150)

## 2018-07-07 LAB — CK TOTAL AND CKMB (NOT AT ARMC)
CK, MB: 5.5 ng/mL — ABNORMAL HIGH (ref 0.5–5.0)
Relative Index: 3.7 — ABNORMAL HIGH (ref 0.0–2.5)
Total CK: 149 U/L (ref 49–397)

## 2018-07-07 LAB — TROPONIN I: Troponin I: <0.03 ng/mL (ref <0.03)

## 2018-07-07 LAB — BILIRUBIN, DIRECT: Bilirubin, Direct: 0.3 mg/dL — ABNORMAL HIGH (ref 0.0–0.2)

## 2018-07-07 LAB — BASIC METABOLIC PANEL
Anion gap: 7 (ref 5–15)
BUN: 12 mg/dL (ref 6–20)
CO2: 24 mmol/L (ref 22–32)
Calcium: 8.4 mg/dL — ABNORMAL LOW (ref 8.9–10.3)
Chloride: 112 mmol/L — ABNORMAL HIGH (ref 98–111)
Creatinine, Ser: 0.87 mg/dL (ref 0.61–1.24)
GFR calc Af Amer: >60 mL/min (ref >60)
GFR calc non Af Amer: >60 mL/min (ref >60)
Glucose, Bld: 122 mg/dL — ABNORMAL HIGH (ref 70–99)
Potassium: 3.9 mmol/L (ref 3.5–5.1)
Sodium: 143 mmol/L (ref 135–145)

## 2018-07-07 LAB — VALPROIC ACID LEVEL: Valproic Acid Lvl: 69 ug/mL (ref 50.0–100.0)

## 2018-07-07 LAB — PHOSPHORUS: Phosphorus: 3.1 mg/dL (ref 2.5–4.6)

## 2018-07-07 LAB — APTT: aPTT: 36 seconds (ref 24–36)

## 2018-07-07 LAB — PROTIME-INR
INR: 1.11
Prothrombin Time: 14.2 seconds (ref 11.4–15.2)

## 2018-07-07 LAB — MAGNESIUM: Magnesium: 2 mg/dL (ref 1.7–2.4)

## 2018-07-07 MED ORDER — SODIUM CHLORIDE 0.9 % IV SOLN
3.0000 g | Freq: Four times a day (QID) | INTRAVENOUS | Status: DC
  Administered 2018-07-07 – 2018-07-09 (×8): 3 g via INTRAVENOUS
  Filled 2018-07-07 (×10): qty 3

## 2018-07-07 MED ORDER — LACTATED RINGERS IV SOLN
INTRAVENOUS | Status: AC
  Administered 2018-07-08: via INTRAVENOUS

## 2018-07-07 MED ORDER — EPINEPHRINE PF 1 MG/10ML IJ SOSY
PREFILLED_SYRINGE | INTRAMUSCULAR | Status: AC
  Filled 2018-07-07: qty 10

## 2018-07-07 MED ORDER — ACETAMINOPHEN 160 MG/5ML PO SOLN
650.0000 mg | Freq: Four times a day (QID) | ORAL | Status: DC | PRN
  Administered 2018-07-07: 650 mg
  Filled 2018-07-07: qty 20.3

## 2018-07-07 NOTE — Progress Notes (Signed)
eLink Physician-Brief Progress Note Patient Name: NIX ELZEY DOB: 1980/07/16 MRN: [id number]   Date of Service  07/07/2018  HPI/Events of Note  CDS requesting iv fluids oredr  eICU Interventions  IV fluids ordered        Migdalia Dk 07/07/2018, 11:58 PM

## 2018-07-07 NOTE — Progress Notes (Signed)
Sputum sample obtained and sent to lab by RT. 

## 2018-07-07 NOTE — Progress Notes (Signed)
Patient was transported to MRI & back without complications.  Jacqulynn Cadet RRT

## 2018-07-07 NOTE — Progress Notes (Addendum)
38 year old man admitted 2/2 post cardiac arrest.  He had myoclonus and once hypothermia protocol was discontinued and paralytics stopped, he developed severe myoclonus on 2/3. Depakote was added to Keppra IV propofol added to Versed and fentanyl infusions and overnight of frequency of myoclonic jerks are decreased He developed hypotension requiring Levophed drip.  Vitals:   07/07/18 0900 07/07/18 1000  BP: 112/69 101/63  Pulse: (!) 101 (!) 111  Resp: (!) 25 (!) 25  Temp: 99 F (37.2 C)   SpO2: 95% 92%   Remains in induced coma, pupils 3 mm bilaterally equally reactive to light, sinus tachycardia on monitor, S1-S2 tacky, decreased breath sounds bilateral, minimal secretions, febrile, FiO2 increased to 70% overnight, warm extremities, soft and nontender abdomen.  Labs show normal renal function, no leukocytosis, increase in triglycerides from 200-3 20.  Chest x-ray personally reviewed which shows left lower lobe infiltrate.  Impression/plan  Acute encephalopathy with myoclonus-concerning for anoxic encephalopathy Proceed with MRI brain today. Continue Keppra and Depakote. Neurology following Poor prognosis given to family for meaningful neurologic recovery.  Fever/aspiration pneumonia-add Unasyn and obtain respiratory culture.  Acute respiratory failure-intubated for airway protection but FiO2 increased likely due to aspiration, consider adding PEEP if unable to lower FiO2.   Shock - related to propofol , low normal EF on echo  Family updated in detail on 2/3, poor prognosis  given for meaningful neurological recovery in view of myoclonus.  The patient is critically ill with multiple organ systems failure and requires high complexity decision making for assessment and support, frequent evaluation and titration of therapies, application of advanced monitoring technologies and extensive interpretation of multiple databases. Critical Care Time devoted to patient care services  described in this note independent of APP/resident  time is 35 minutes.   Comer Locket Vassie Loll MD

## 2018-07-07 NOTE — Progress Notes (Addendum)
NEUROLOGY PROGRESS NOTE  Subjective: Patient remains intubated at this time.  He is breathing over the vent while on propofol; he has no other brainstem function either due to intrinsic neurological damage or propofol sedation.  Mother still is hoping for a miracle.  No myoclonus noted at this time in the context of increased propofol dose.   Exam: Vitals:   07/07/18 0700 07/07/18 0731  BP: 100/62   Pulse: (!) 104   Resp: (!) 21   Temp:  98.6 F (37 C)  SpO2: 90%     Physical Exam   HEENT-  Normocephalic, no lesions, without obvious abnormality.  Normal external eye and conjunctiva.  Extremities- Warm, dry and intact Musculoskeletal-no joint tenderness, deformity or swelling Skin-warm and dry, no hyperpigmentation, vitiligo, or suspicious lesions   Neuro:  Mental Status: (on propofol sedation) Patient does not respond to verbal stimuli.  Does not respond to deep sternal rub.  Does not follow commands.  No verbalizations noted.  Intubated and breathing over the vent Cranial Nerves: II: patient does not respond confrontation bilaterally (sedated)  III,IV,VI: doll's response absent bilaterally. pupils right 2 mm, left 2 mm,and nonreactive bilaterally (sedated) V,VII: corneal reflex absent bilaterally (sedated) VIII: patient does not respond to verbal stimuli IX,X: gag reflex absent, XI: trapezius strength unable to test bilaterally XII: tongue strength unable to test Motor: Extremities flaccid throughout.  No spontaneous movement noted.  No purposeful movements noted. Sensory: Does not respond to noxious stimuli in any extremity. Deep Tendon Reflexes:  Absent throughout. Plantars: absent bilaterally Cerebellar: Unable to perform     Medications:  Scheduled: . artificial tears  1 application Both Eyes W7P  . chlorhexidine gluconate (MEDLINE KIT)  15 mL Mouth Rinse BID  . cisatracurium  0.1 mg/kg Intravenous Once  . enoxaparin (LOVENOX) injection  40 mg Subcutaneous  Q24H  . fentaNYL (SUBLIMAZE) injection  100 mcg Intravenous Once  . insulin aspart  0-9 Units Subcutaneous Q4H  . mouth rinse  15 mL Mouth Rinse 10 times per day   Continuous: . sodium chloride    . cisatracurium (NIMBEX) infusion Stopped (07/06/18 1526)  . clevidipine Stopped (07/06/18 0959)  . famotidine (PEPCID) IV Stopped (07/06/18 2325)  . feeding supplement (VITAL AF 1.2 CAL) 40 mL/hr at 07/07/18 0430  . fentaNYL infusion INTRAVENOUS 50 mcg/hr (07/07/18 0700)  . levETIRAcetam Stopped (07/07/18 0529)  . midazolam 8 mg/hr (07/07/18 0833)  . norepinephrine (LEVOPHED) Adult infusion 20 mcg/min (07/07/18 0700)  . propofol (DIPRIVAN) infusion 40 mcg/kg/min (07/07/18 0700)  . sodium chloride     Followed by  . sodium chloride    . sodium chloride    . valproate sodium Stopped (07/07/18 0249)   XTG:GYIRS/WNIOEVOJ arterial line **AND** sodium chloride, cisatracurium **AND** cisatracurium (NIMBEX) infusion **AND** cisatracurium, fentaNYL, hydrALAZINE, midazolam  Pertinent Labs/Diagnostics: None at this time   Etta Quill PA-C [name] [phone]   Assessment: 38 year old male brought to hospital after cardiac arrest with unknown downtime.  Patient currently on propofol, Versed and fentanyl, along with Keppra and Depakote.  Myoclonus has stopped at this point in time.  Exam as above. 1. Patient was fully rewarmed at 1800 hrs. on 07/06/2018.  That would be 14.5 hours at this point in time. 2. As stated previously, early onset myoclonus is a very poor prognostic indicator.  Recommendations: 1. Continue Keppra, Depakote, propofol, Versed and fentanyl 2. At this point in time will order MRI brain without contrast to evaluate for parenchymal hypoxic injury 3. Valproic acid level  is pending.   35 minutes spent in the neurological evaluation and management of this critically ill patient. Today's exam is consistent with sedation versus anoxic brain damage or combined  effects. Plan is to obtain MRI brain.   Electronically signed: Dr. Kerney Elbe 07/07/2018, 8:35 AM   Addendum: MRI shows extensive anoxic injury and prognosis is very dismal.  IF further exam is wanted by family Versed will need to be turned off for 24 hours and propofol turned of for 3 hours.

## 2018-07-07 NOTE — Progress Notes (Signed)
Pharmacy Antibiotic Note  Christopher Berg is a 38 y.o. male admitted on 07/04/2018 with aspiration PNA.  Pharmacy has been consulted for Unasyn dosing.  Plan: Unasyn 3g IV q6 hrs  F/u cultures, clinical course.  Height: 6' (182.9 cm) Weight: 171 lb 4.8 oz (77.7 kg) IBW/kg (Calculated) : 77.6  Temp (24hrs), Avg:96.9 F (36.1 C), Min:94.3 F (34.6 C), Max:100.4 F (38 C)  Recent Labs  Lab 2019-04-15 0052 2019-04-15 0353 07/06/18 0300  07/06/18 1430 07/06/18 1610 07/06/18 1810 07/06/18 1955 07/07/18 0520  WBC 15.8* 12.5* 12.2*  --   --   --   --   --  9.0  CREATININE 1.05 0.75 0.48*   < > 0.55* 0.53* 0.87 0.95 0.87   < > = values in this interval not displayed.    Estimated Creatinine Clearance: 128.8 mL/min (by C-G formula based on SCr of 0.87 mg/dL).    No Known Allergies  Antimicrobials this admission: Unasyn 2/4 >   Dose adjustments this admission:  Microbiology results: none  Thank you for allowing pharmacy to be a part of this patient's care.  Jenetta DownerJessica Matson Welch, Pharm D, BCPS, [facility] Clinical Pharmacist Phone [phone]  07/07/2018 11:31 AM

## 2018-07-07 NOTE — Progress Notes (Signed)
MRI reviewed with family.  Again poor prognosis given for meaningful neurological recovery. Mother expressed that she would not want to see him having myoclonic jerks again there would certainly not want tracheostomy and permanent tubes. We will attempt to taper propofol to see if myoclonus is resolved Otherwise anticipate progressing to comfort care over the next 48 hours Will inform organ donor -mother requested  Additional critical care time x 63m  Kelcey Wickstrom V. Vassie Loll MD

## 2018-07-08 ENCOUNTER — Encounter (HOSPITAL_COMMUNITY): Payer: Self-pay | Admitting: Anesthesiology

## 2018-07-08 ENCOUNTER — Inpatient Hospital Stay (HOSPITAL_COMMUNITY): Payer: Medicaid Other

## 2018-07-08 DIAGNOSIS — Z529 Donor of unspecified organ or tissue: Secondary | ICD-10-CM

## 2018-07-08 LAB — CBC
HCT: 44.6 % (ref 39.0–52.0)
Hemoglobin: 14.3 g/dL (ref 13.0–17.0)
MCH: 30 pg (ref 26.0–34.0)
MCHC: 32.1 g/dL (ref 30.0–36.0)
MCV: 93.7 fL (ref 80.0–100.0)
Platelets: 129 10*3/uL — ABNORMAL LOW (ref 150–400)
RBC: 4.76 MIL/uL (ref 4.22–5.81)
RDW: 13.5 % (ref 11.5–15.5)
WBC: 5.1 10*3/uL (ref 4.0–10.5)
nRBC: 0 % (ref 0.0–0.2)

## 2018-07-08 LAB — POCT I-STAT 7, (LYTES, BLD GAS, ICA,H+H)
Acid-Base Excess: 4 mmol/L — ABNORMAL HIGH (ref 0.0–2.0)
Acid-Base Excess: 7 mmol/L — ABNORMAL HIGH (ref 0.0–2.0)
BICARBONATE: 28.7 mmol/L — AB (ref 20.0–28.0)
Bicarbonate: 32.5 mmol/L — ABNORMAL HIGH (ref 20.0–28.0)
CALCIUM ION: 1.22 mmol/L (ref 1.15–1.40)
Calcium, Ion: 1.25 mmol/L (ref 1.15–1.40)
HCT: 43 % (ref 39.0–52.0)
HCT: 33 % — ABNORMAL LOW (ref 39.0–52.0)
Hemoglobin: 14.6 g/dL (ref 13.0–17.0)
Hemoglobin: 11.2 g/dL — ABNORMAL LOW (ref 13.0–17.0)
O2 Saturation: 100 %
O2 Saturation: 100 %
Patient temperature: 36.9
Patient temperature: 37
Potassium: 3.3 mmol/L — ABNORMAL LOW (ref 3.5–5.1)
Potassium: 3 mmol/L — ABNORMAL LOW (ref 3.5–5.1)
SODIUM: 144 mmol/L (ref 135–145)
Sodium: 142 mmol/L (ref 135–145)
TCO2: 30 mmol/L (ref 22–32)
TCO2: 34 mmol/L — ABNORMAL HIGH (ref 22–32)
pCO2 arterial: 43.3 mmHg (ref 32.0–48.0)
pCO2 arterial: 47.6 mmHg (ref 32.0–48.0)
pH, Arterial: 7.428 (ref 7.350–7.450)
pH, Arterial: 7.443 (ref 7.350–7.450)
pO2, Arterial: 338 mmHg — ABNORMAL HIGH (ref 83.0–108.0)
pO2, Arterial: 317 mmHg — ABNORMAL HIGH (ref 83.0–108.0)

## 2018-07-08 LAB — ECHOCARDIOGRAM COMPLETE
Height: 72 in
Weight: 2624.71 oz

## 2018-07-08 LAB — GLUCOSE, CAPILLARY
Glucose-Capillary: 101 mg/dL — ABNORMAL HIGH (ref 70–99)
Glucose-Capillary: 104 mg/dL — ABNORMAL HIGH (ref 70–99)
Glucose-Capillary: 114 mg/dL — ABNORMAL HIGH (ref 70–99)
Glucose-Capillary: 127 mg/dL — ABNORMAL HIGH (ref 70–99)
Glucose-Capillary: 133 mg/dL — ABNORMAL HIGH (ref 70–99)
Glucose-Capillary: 151 mg/dL — ABNORMAL HIGH (ref 70–99)

## 2018-07-08 LAB — URINALYSIS, ROUTINE W REFLEX MICROSCOPIC
BILIRUBIN URINE: NEGATIVE
Bacteria, UA: NONE SEEN
Glucose, UA: NEGATIVE mg/dL
HGB URINE DIPSTICK: NEGATIVE
Ketones, ur: 20 mg/dL — AB
Leukocytes, UA: NEGATIVE
Nitrite: NEGATIVE
Protein, ur: 30 mg/dL — AB
Specific Gravity, Urine: 1.026 (ref 1.005–1.030)
pH: 6 (ref 5.0–8.0)

## 2018-07-08 LAB — BASIC METABOLIC PANEL
Anion gap: 10 (ref 5–15)
BUN: 6 mg/dL (ref 6–20)
CO2: 24 mmol/L (ref 22–32)
CREATININE: 0.7 mg/dL (ref 0.61–1.24)
Calcium: 9 mg/dL (ref 8.9–10.3)
Chloride: 108 mmol/L (ref 98–111)
GFR calc Af Amer: >60 mL/min (ref >60)
GFR calc non Af Amer: >60 mL/min (ref >60)
GLUCOSE: 128 mg/dL — AB (ref 70–99)
Potassium: 3.9 mmol/L (ref 3.5–5.1)
Sodium: 142 mmol/L (ref 135–145)

## 2018-07-08 LAB — MAGNESIUM: Magnesium: 1.9 mg/dL (ref 1.7–2.4)

## 2018-07-08 LAB — PHOSPHORUS: Phosphorus: 2.9 mg/dL (ref 2.5–4.6)

## 2018-07-08 MED ORDER — PIPERACILLIN-TAZOBACTAM 3.375 G IVPB 30 MIN
3.3750 g | Freq: Once | INTRAVENOUS | Status: AC
  Administered 2018-07-09: 3.375 g via INTRAVENOUS
  Filled 2018-07-08 (×2): qty 50

## 2018-07-08 MED ORDER — VANCOMYCIN HCL 10 G IV SOLR
1500.0000 mg | Freq: Once | INTRAVENOUS | Status: AC
  Administered 2018-07-09: 1500 mg via INTRAVENOUS
  Filled 2018-07-08: qty 1500

## 2018-07-08 MED ORDER — DOPAMINE-DEXTROSE 3.2-5 MG/ML-% IV SOLN
3.0000 ug/kg/min | INTRAVENOUS | Status: DC
  Administered 2018-07-08: 3 ug/kg/min via INTRAVENOUS
  Filled 2018-07-08: qty 250

## 2018-07-08 MED ORDER — HEPARIN SODIUM (PORCINE) 1000 UNIT/ML IJ SOLN
22350.0000 [IU] | Freq: Once | INTRAMUSCULAR | Status: DC
  Filled 2018-07-08 (×2): qty 22.35

## 2018-07-08 NOTE — Progress Notes (Signed)
Recruitment maneuver (PC 25, rate 10, PEEP 5, itime 3.0, and FiO2 100%) performed for 2 minutes, as well as, 10 minutes of CPT through the bed per CDS request. RN aware. 

## 2018-07-08 NOTE — Progress Notes (Signed)
38 year old man admitted 2/2 post cardiac arrest.  He had myoclonus and once hypothermia protocol was discontinued and paralytics stopped, he developed severe myoclonus on 2/3. Depakote was added to Keppra IV propofol added to Versed and fentanyl infusions and overnight 2/4 frequency of myoclonic jerks decreased  On neuro exam this morning around 10:00, pupils dilated not reactive to light, doll's eye absent, no spontaneous respiration with rate turned up to 10 on ventilator but weak corneal reflex present, no conjunctival reflex, no gag reflex.  This exam was performed while he was on Versed at 5 mg an hour.  Sputum culture shows moderate gram-negative rods and gram-positive cocci ABG appears okay. Labs otherwise unremarkable, new leukocytosis.  Impression/plan Severe anoxic encephalopathy -confirmed on MRI Myoclonus has resolved, continue Keppra and Depakote. Propofol is been discontinued, will attempt to taper Versed to off Poor prognosis given to family for meaningful neurological recovery In fact exam this morning shows that he may be progressing with absent brainstem signs except for corneal reflex  Shock-related to a bowel, now off Levophed, low normal EF on echo  Acute respiratory failure -continue empiric Unasyn for aspiration pneumonia while we await sputum culture  Family has been updated in detail every day including mom Bonita Quin and brother, DNR issued, they have indicated willingness for organ donation and indicated that they may be amenable to withdrawal once family members have had a chance to visit.  The patient is critically ill with multiple organ systems failure and requires high complexity decision making for assessment and support, frequent evaluation and titration of therapies, application of advanced monitoring technologies and extensive interpretation of multiple databases. Critical Care Time devoted to patient care services described in this note independent of  APP/resident  time is 34 minutes.   Christopher Locket Vassie Loll MD

## 2018-07-08 NOTE — Progress Notes (Signed)
RT NOTE:  Lung Recruitment x2 minutes. Pt tolerated well. Vital stable throughout

## 2018-07-08 NOTE — Progress Notes (Signed)
Recruitment maneuver (PC 25, rate 10, PEEP 5, itime 3.0, and FiO2 100%) performed for 2 minutes, as well as, 10 minutes of CPT through the bed per CDS request. RN aware.

## 2018-07-08 NOTE — Progress Notes (Signed)
   07/08/18 2148  Clinical Encounter Type  Visited With Patient;Health care provider;Other (Comment) (Friends)  Visit Type Patient actively dying  Referral From Nurse  Consult/Referral To Chaplain  Spiritual Encounters  Spiritual Needs Prayer   PT's friends requested a Chaplain visit and prayer for their friend.  They shared about the PT's life and how they are all connected.  They had gotten permission from the PT's mom to have a blessing/prayer said for the PT.  Chaplain offered a prayer and offered support for the friends.  Available if further needs arise. Chaplain Agustin Cree

## 2018-07-08 NOTE — Progress Notes (Signed)
  Echocardiogram 2D Echocardiogram has been performed.  Gerda Diss 07/08/2018, 11:21 AM

## 2018-07-08 NOTE — Progress Notes (Addendum)
RT NOTE:  Lung Recruitment x2 minutes per [facility] rep.

## 2018-07-09 ENCOUNTER — Inpatient Hospital Stay (HOSPITAL_COMMUNITY): Payer: Medicaid Other

## 2018-07-09 ENCOUNTER — Encounter (HOSPITAL_COMMUNITY): Admission: EM | Disposition: E | Payer: Self-pay | Source: Home / Self Care | Attending: Emergency Medicine

## 2018-07-09 HISTORY — PX: ORGAN PROCUREMENT: SHX5270

## 2018-07-09 LAB — BASIC METABOLIC PANEL
Anion gap: 11 (ref 5–15)
BUN: 13 mg/dL (ref 6–20)
CALCIUM: 9.1 mg/dL (ref 8.9–10.3)
CO2: 26 mmol/L (ref 22–32)
Chloride: 109 mmol/L (ref 98–111)
Creatinine, Ser: 0.64 mg/dL (ref 0.61–1.24)
GFR calc Af Amer: >60 mL/min (ref >60)
Glucose, Bld: 128 mg/dL — ABNORMAL HIGH (ref 70–99)
Potassium: 3.1 mmol/L — ABNORMAL LOW (ref 3.5–5.1)
Sodium: 146 mmol/L — ABNORMAL HIGH (ref 135–145)

## 2018-07-09 LAB — DRUG SCREEN 10 W/CONF, SERUM
Amphetamines, IA: NEGATIVE ng/mL
Barbiturates, IA: NEGATIVE ug/mL
Benzodiazepines, IA: POSITIVE ng/mL
Cocaine & Metabolite, IA: NEGATIVE ng/mL
Methadone, IA: NEGATIVE ng/mL
OPIATES, IA: NEGATIVE ng/mL
OXYCODONES, IA: NEGATIVE ng/mL
Phencyclidine, IA: NEGATIVE ng/mL
Propoxyphene, IA: NEGATIVE ng/mL
THC(Marijuana) Metabolite, IA: NEGATIVE ng/mL

## 2018-07-09 LAB — BENZODIAZEPINES,MS,WB/SP RFX
7-Aminoclonazepam: NEGATIVE ng/mL
Alprazolam: NEGATIVE ng/mL
Benzodiazepines Confirm: POSITIVE
CHLORDIAZEPOXIDE: NEGATIVE ng/mL
Clonazepam: NEGATIVE ng/mL
Desalkylflurazepam: NEGATIVE ng/mL
Desmethylchlordiazepoxide: NEGATIVE ng/mL
Desmethyldiazepam: NEGATIVE ng/mL
Diazepam: NEGATIVE ng/mL
Flurazepam: NEGATIVE ng/mL
Lorazepam: NEGATIVE ng/mL
Midazolam: 162 ng/mL
OXAZEPAM: NEGATIVE ng/mL
Temazepam: NEGATIVE ng/mL
Triazolam: NEGATIVE ng/mL

## 2018-07-09 LAB — TYPE AND SCREEN
ABO/RH(D): O NEG
ANTIBODY SCREEN: NEGATIVE
Unit division: 0
Unit division: 0
Unit division: 0
WEAK D: NEGATIVE

## 2018-07-09 LAB — CBC
HCT: 36 % — ABNORMAL LOW (ref 39.0–52.0)
Hemoglobin: 11.7 g/dL — ABNORMAL LOW (ref 13.0–17.0)
MCH: 30.4 pg (ref 26.0–34.0)
MCHC: 32.5 g/dL (ref 30.0–36.0)
MCV: 93.5 fL (ref 80.0–100.0)
Platelets: 101 10*3/uL — ABNORMAL LOW (ref 150–400)
RBC: 3.85 MIL/uL — ABNORMAL LOW (ref 4.22–5.81)
RDW: 13.3 % (ref 11.5–15.5)
WBC: 7.1 10*3/uL (ref 4.0–10.5)
nRBC: 0 % (ref 0.0–0.2)

## 2018-07-09 LAB — BPAM RBC
Blood Product Expiration Date: 202002232359
Blood Product Expiration Date: 202002232359
Blood Product Expiration Date: 202002232359
ISSUE DATE / TIME: 202002011116
ISSUE DATE / TIME: 202002011116
ISSUE DATE / TIME: 202002050950
Unit Type and Rh: 9500
Unit Type and Rh: 9500
Unit Type and Rh: 9500

## 2018-07-09 LAB — POCT I-STAT 7, (LYTES, BLD GAS, ICA,H+H)
Acid-Base Excess: 6 mmol/L — ABNORMAL HIGH (ref 0.0–2.0)
Bicarbonate: 31.5 mmol/L — ABNORMAL HIGH (ref 20.0–28.0)
Calcium, Ion: 1.34 mmol/L (ref 1.15–1.40)
HCT: 33 % — ABNORMAL LOW (ref 39.0–52.0)
Hemoglobin: 11.2 g/dL — ABNORMAL LOW (ref 13.0–17.0)
O2 Saturation: 100 %
PH ART: 7.442 (ref 7.350–7.450)
PO2 ART: 276 mmHg — AB (ref 83.0–108.0)
Patient temperature: 37
Potassium: 3.6 mmol/L (ref 3.5–5.1)
Sodium: 144 mmol/L (ref 135–145)
TCO2: 33 mmol/L — AB (ref 22–32)
pCO2 arterial: 46.1 mmHg (ref 32.0–48.0)

## 2018-07-09 LAB — ECHOCARDIOGRAM LIMITED
Height: 72 in
Weight: 2712.54 oz

## 2018-07-09 LAB — URINE CULTURE: Culture: NO GROWTH

## 2018-07-09 LAB — GLUCOSE, CAPILLARY
GLUCOSE-CAPILLARY: 100 mg/dL — AB (ref 70–99)
Glucose-Capillary: 109 mg/dL — ABNORMAL HIGH (ref 70–99)
Glucose-Capillary: 111 mg/dL — ABNORMAL HIGH (ref 70–99)
Glucose-Capillary: 99 mg/dL (ref 70–99)

## 2018-07-09 LAB — PREPARE RBC (CROSSMATCH)

## 2018-07-09 LAB — PHOSPHORUS: Phosphorus: 2.2 mg/dL — ABNORMAL LOW (ref 2.5–4.6)

## 2018-07-09 LAB — MAGNESIUM: Magnesium: 2 mg/dL (ref 1.7–2.4)

## 2018-07-09 SURGERY — SURGICAL PROCUREMENT, ORGAN
Anesthesia: General | Site: Abdomen

## 2018-07-09 MED ORDER — MIDAZOLAM HCL 2 MG/2ML IJ SOLN
INTRAMUSCULAR | Status: AC
  Filled 2018-07-09: qty 2

## 2018-07-09 MED ORDER — SODIUM CHLORIDE 0.9% IV SOLUTION: Freq: Once | INTRAVENOUS | Status: DC

## 2018-07-09 MED ORDER — FENTANYL CITRATE (PF) 100 MCG/2ML IJ SOLN
INTRAMUSCULAR | Status: AC
  Filled 2018-07-09: qty 2

## 2018-07-09 MED ORDER — POTASSIUM CHLORIDE 10 MEQ/100ML IV SOLN
10.0000 meq | INTRAVENOUS | Status: AC
  Administered 2018-07-09 (×4): 10 meq via INTRAVENOUS
  Filled 2018-07-09 (×4): qty 100

## 2018-07-09 MED ORDER — HEPARIN SODIUM (PORCINE) 1000 UNIT/ML IJ SOLN: 8200.0000 [IU] | Freq: Once | INTRAMUSCULAR | Status: DC

## 2018-07-09 MED ORDER — METOPROLOL TARTRATE 5 MG/5ML IV SOLN
5.0000 mg | Freq: Four times a day (QID) | INTRAVENOUS | Status: DC | PRN
  Administered 2018-07-09: 5 mg via INTRAVENOUS
  Filled 2018-07-09: qty 5

## 2018-07-09 MED ORDER — NOREPINEPHRINE 4 MG/250ML-% IV SOLN: 0.0000 ug/min | INTRAVENOUS | Status: DC

## 2018-07-09 MED ORDER — SODIUM CHLORIDE 0.9 % IV SOLN
INTRAVENOUS | Status: DC | PRN
  Administered 2018-07-09: 01:00:00 via INTRAVENOUS

## 2018-07-09 MED ORDER — FENTANYL CITRATE (PF) 100 MCG/2ML IJ SOLN
25.0000 ug | INTRAMUSCULAR | Status: DC | PRN
  Administered 2018-07-09: 50 ug via INTRAVENOUS
  Filled 2018-07-09: qty 2

## 2018-07-09 MED ORDER — 0.9 % SODIUM CHLORIDE (POUR BTL) OPTIME
TOPICAL | Status: DC | PRN
  Administered 2018-07-09: 2000 mL
  Administered 2018-07-09: 3000 mL
  Administered 2018-07-09: 1000 mL

## 2018-07-09 MED ORDER — FENTANYL CITRATE (PF) 250 MCG/5ML IJ SOLN
INTRAMUSCULAR | Status: AC
  Filled 2018-07-09: qty 5

## 2018-07-09 SURGICAL SUPPLY — 86 items
BLADE CLIPPER SURG (BLADE) ×3 IMPLANT
BLADE STERNUM SYSTEM 6 (BLADE) ×3 IMPLANT
BLADE SURG 10 STRL SS (BLADE) ×18 IMPLANT
CANNULA VESSEL W/WING WO/VALVE (CANNULA) ×3 IMPLANT
CLIP VESOCCLUDE MED 24/CT (CLIP) ×6 IMPLANT
CLIP VESOCCLUDE SM WIDE 24/CT (CLIP) ×6 IMPLANT
CONT SPEC 4OZ CLIKSEAL STRL BL (MISCELLANEOUS) ×9 IMPLANT
COVER BACK TABLE 60X90IN (DRAPES) ×3 IMPLANT
COVER MAYO STAND STRL (DRAPES) ×3 IMPLANT
COVER SURGICAL LIGHT HANDLE (MISCELLANEOUS) ×6 IMPLANT
COVER WAND RF STERILE (DRAPES) ×3 IMPLANT
DRAPE HALF SHEET 40X57 (DRAPES) ×6 IMPLANT
DRAPE SLUSH MACHINE 52X66 (DRAPES) ×3 IMPLANT
DRSG COVADERM 4X10 (GAUZE/BANDAGES/DRESSINGS) ×3 IMPLANT
DRSG COVADERM 4X14 (GAUZE/BANDAGES/DRESSINGS) ×3 IMPLANT
DRSG TELFA 3X8 NADH (GAUZE/BANDAGES/DRESSINGS) ×3 IMPLANT
DURAPREP 26ML APPLICATOR (WOUND CARE) ×3 IMPLANT
ELECT BLADE 6.5 EXT (BLADE) ×3 IMPLANT
ELECT REM PT RETURN 9FT ADLT (ELECTROSURGICAL) ×6
ELECTRODE REM PT RTRN 9FT ADLT (ELECTROSURGICAL) ×2 IMPLANT
GAUZE 4X4 16PLY RFD (DISPOSABLE) IMPLANT
GLOVE BIO SURGEON STRL SZ7 (GLOVE) ×6 IMPLANT
GLOVE BIO SURGEON STRL SZ7.5 (GLOVE) ×6 IMPLANT
GLOVE BIO SURGEON STRL SZ8 (GLOVE) ×6 IMPLANT
GLOVE BIO SURGEON STRL SZ8.5 (GLOVE) ×6 IMPLANT
GLOVE BIOGEL PI IND STRL 6.5 (GLOVE) ×1 IMPLANT
GLOVE BIOGEL PI IND STRL 7.0 (GLOVE) ×2 IMPLANT
GLOVE BIOGEL PI IND STRL 7.5 (GLOVE) ×2 IMPLANT
GLOVE BIOGEL PI IND STRL 8 (GLOVE) ×2 IMPLANT
GLOVE BIOGEL PI IND STRL 8.5 (GLOVE) ×2 IMPLANT
GLOVE BIOGEL PI INDICATOR 6.5 (GLOVE) ×2
GLOVE BIOGEL PI INDICATOR 7.0 (GLOVE) ×4
GLOVE BIOGEL PI INDICATOR 7.5 (GLOVE) ×4
GLOVE BIOGEL PI INDICATOR 8 (GLOVE) ×4
GLOVE BIOGEL PI INDICATOR 8.5 (GLOVE) ×4
GLOVE SURG SS PI 7.0 STRL IVOR (GLOVE) ×3 IMPLANT
GLOVE SURG SS PI 7.5 STRL IVOR (GLOVE) ×3 IMPLANT
GLOVE SURG SS PI 8.0 STRL IVOR (GLOVE) ×3 IMPLANT
GOWN STRL REUS W/ TWL LRG LVL3 (GOWN DISPOSABLE) ×5 IMPLANT
GOWN STRL REUS W/ TWL XL LVL3 (GOWN DISPOSABLE) ×2 IMPLANT
GOWN STRL REUS W/TWL LRG LVL3 (GOWN DISPOSABLE) ×10
GOWN STRL REUS W/TWL XL LVL3 (GOWN DISPOSABLE) ×4
HANDLE SUCTION POOLE (INSTRUMENTS) ×2 IMPLANT
KIT POST MORTEM ADULT 36X90 (BAG) ×3 IMPLANT
KIT TURNOVER KIT B (KITS) ×3 IMPLANT
LOOP VESSEL MAXI BLUE (MISCELLANEOUS) ×6 IMPLANT
LOOP VESSEL MINI RED (MISCELLANEOUS) ×6 IMPLANT
MANIFOLD NEPTUNE II (INSTRUMENTS) ×3 IMPLANT
NEEDLE BIOPSY 14X6 SOFT TISS (NEEDLE) ×3 IMPLANT
NS IRRIG 1000ML POUR BTL (IV SOLUTION) ×15 IMPLANT
PACK AORTA (CUSTOM PROCEDURE TRAY) ×3 IMPLANT
PAD ARMBOARD 7.5X6 YLW CONV (MISCELLANEOUS) ×6 IMPLANT
PAD SHARPS MAGNETIC DISPOSAL (MISCELLANEOUS) ×3 IMPLANT
PENCIL BUTTON HOLSTER BLD 10FT (ELECTRODE) ×3 IMPLANT
SOL PREP POV-IOD 4OZ 10% (MISCELLANEOUS) ×6 IMPLANT
SPONGE INTESTINAL PEANUT (DISPOSABLE) ×3 IMPLANT
SPONGE LAP 18X18 X RAY DECT (DISPOSABLE) ×15 IMPLANT
STAPLER VISISTAT 35W (STAPLE) ×3 IMPLANT
SUCTION POOLE HANDLE (INSTRUMENTS) ×6
SUCTION POOLE TIP (SUCTIONS) ×6 IMPLANT
SUT BONE WAX W31G (SUTURE) ×3 IMPLANT
SUT ETHIBOND 5 LR DA (SUTURE) ×3 IMPLANT
SUT ETHILON 1 LR 30 (SUTURE) ×6 IMPLANT
SUT ETHILON 1 TP 1 60 (SUTURE) ×6 IMPLANT
SUT ETHILON 2 LR (SUTURE) ×3 IMPLANT
SUT PROLENE 4 0 RB 1 (SUTURE)
SUT PROLENE 4-0 RB1 .5 CRCL 36 (SUTURE) IMPLANT
SUT PROLENE 5 0 C 1 24 (SUTURE) IMPLANT
SUT PROLENE 6 0 BV (SUTURE) ×3 IMPLANT
SUT SILK 0 TIES 10X30 (SUTURE) ×3 IMPLANT
SUT SILK 1 SH (SUTURE) IMPLANT
SUT SILK 1 TIES 10X30 (SUTURE) ×3 IMPLANT
SUT SILK 2 0 (SUTURE)
SUT SILK 2 0 SH (SUTURE) IMPLANT
SUT SILK 2 0 SH CR/8 (SUTURE) ×3 IMPLANT
SUT SILK 2 0 TIES 10X30 (SUTURE) ×3 IMPLANT
SUT SILK 2-0 18XBRD TIE 12 (SUTURE) IMPLANT
SUT SILK 3 0 TIES 10X30 (SUTURE) ×3 IMPLANT
SUT SILK 3 0SH CR/8 30 (SUTURE) ×3 IMPLANT
SYRINGE 60CC LL (MISCELLANEOUS) IMPLANT
SYRINGE TOOMEY DISP (SYRINGE) ×3 IMPLANT
TAPE UMBILICAL 1/8 X36 TWILL (MISCELLANEOUS) ×6 IMPLANT
TUBE CONNECTING 12'X1/4 (SUCTIONS) ×2
TUBE CONNECTING 12X1/4 (SUCTIONS) ×4 IMPLANT
WATER STERILE IRR 1000ML POUR (IV SOLUTION) IMPLANT
YANKAUER SUCT BULB TIP NO VENT (SUCTIONS) ×6 IMPLANT

## 2018-07-10 ENCOUNTER — Encounter (HOSPITAL_COMMUNITY): Payer: Self-pay

## 2018-07-10 LAB — CULTURE, RESPIRATORY W GRAM STAIN

## 2018-07-10 LAB — CULTURE, RESPIRATORY

## 2018-07-13 LAB — CULTURE, BLOOD (ROUTINE X 2)
CULTURE: NO GROWTH
Culture: NO GROWTH
Special Requests: ADEQUATE
Special Requests: ADEQUATE

## 2018-08-02 NOTE — Progress Notes (Signed)
Pt became what seemed to be agitated,  Moving his arms and legs when RT and myself were changing his Arterial line dressing. His heart rate went up in the 130's, with blood pressures in the 180/100's and respirations in the 30's. Elink notified, and new orders placed. CDS also made aware. RN will continue to monitor closely.

## 2018-08-02 NOTE — Progress Notes (Signed)
Donor Pt transported on vent to OR.  Pt extubated at 13:58 per CDS order.

## 2018-08-02 NOTE — Progress Notes (Signed)
Patient enter OR 4 at 1318. 22,350 of heparin given by RN at 1356 and extubated at 1358. RN pushed of Fentanyl and provided suction for comfort post extubation. Patient went asystole at 1437. Per hospital protocol team waited 5 minutes and then verified asystole by listening for 1 minute.Time of death called at 1442.

## 2018-08-02 NOTE — Progress Notes (Signed)
Nutrition Brief Note  Chart reviewed. Pt now DNR with plan to withdrawal life support with extubation to comfort care once family ready No further nutrition interventions warranted at this time.  Please re-consult if needed.   Romelle Starcher MS, RD, LDN, CNSC [phone] Pager  [phone] Weekend/On-Call Pager

## 2018-08-02 NOTE — Progress Notes (Signed)
38 year old man admitted 2/2 post cardiac arrest. He had myoclonus and once hypothermia protocol was discontinued and paralytics stopped, he developed severe myoclonus on 2/3. Depakote was added to Keppra MRI showed evidence of severe anoxic injury  Myoclonus is now subsided-propofol and Versed were tapered off On exam-comatose, off all sedation, has brisk corneal reflex and over breathes went , pupils 5 mm dilated not reacting to light, has gag reflex. In fact has spontaneous breathing on pressure support 5/5.  Labs show hypokalemia and hypophosphatemia Chest x-ray personally reviewed which shows no new infiltrates.  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  Acute respiratory failure-does have spontaneous respirations  Family has been updated in detail.  DNR issued. Withdrawal of life support orders will be placed as signed and held to be released when family indicates  The patient is critically ill with multiple organ systems failure and requires high complexity decision making for assessment and support, frequent evaluation and titration of therapies, application of advanced monitoring technologies and extensive interpretation of multiple databases. Critical Care Time devoted to patient care services described in this note independent of APP/resident  time is 31 minutes.   Comer Locket Vassie Loll MD

## 2018-08-02 NOTE — Progress Notes (Signed)
RT NOTE:  Lung Recruitment done x2 minutes. Pt tolerated well. Vitals stable throughout.

## 2018-08-02 NOTE — Progress Notes (Signed)
eLink Physician-Brief Progress Note Patient Name: MADDUX SZOT DOB: 1980/07/01 MRN: [id number]   Date of Service  July 16, 2018  HPI/Events of Note  K+ 3.1  eICU Interventions  Elink K+ replacement protocol for K+ of 3.1        Skyelyn Scruggs U Polo Mcmartin 2018/07/16, 6:29 AM

## 2018-08-02 NOTE — Progress Notes (Signed)
eLink Physician-Brief Progress Note Patient Name: Christopher Berg DOB: 22-Nov-1980 MRN: [id number]   Date of Service  08-02-18  HPI/Events of Note  Tachycardia and hypertension  eICU Interventions  Fentanyl 25-100 micrograms Q 2 hours prn        Okoronkwo U Ogan August 02, 2018, 2:30 AM

## 2018-08-02 NOTE — Anesthesia Preprocedure Evaluation (Deleted)
Anesthesia Evaluation    Airway        Dental   Pulmonary           Cardiovascular      Neuro/Psych    GI/Hepatic   Endo/Other    Renal/GU      Musculoskeletal   Abdominal   Peds  Hematology   Anesthesia Other Findings   Reproductive/Obstetrics                            Anesthesia Physical  Anesthesia Plan  ASA:   Anesthesia Plan:    Post-op Pain Management:    Induction:   PONV Risk Score and Plan:   Airway Management Planned:   Additional Equipment:   Intra-op Plan:   Post-operative Plan:   Informed Consent:   Plan Discussed with:   Anesthesia Plan Comments:        Anesthesia Quick Evaluation  

## 2018-08-02 NOTE — Progress Notes (Signed)
Vassie Loll MD paged about AM K+ of 3.1. Orders received for replacement. MD also stated TTM pads can be discontinued. Will continue to monitor.

## 2018-08-02 NOTE — Death Summary Note (Signed)
38 year old man admitted 2/2 post cardiac arrest. He had myoclonus and once hypothermia protocol was discontinued and paralytics stopped, he developed severe myoclonus on 2/3. 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 Agreed that he would not want permanent life support or want to be in a vegetative state.  They asked for organ donation and organ donor was contacted. Life support was withdrawn and he passed away on 2/6.  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 Vassie Loll MD

## 2018-08-02 NOTE — Progress Notes (Signed)
  Echocardiogram 2D Echocardiogram has been performed.  Delcie Roch 07/28/2018, 8:38 AM

## 2018-08-02 DEATH — deceased
